# Patient Record
Sex: Male | Born: 1955 | Race: White | Hispanic: No | Marital: Married | Smoking: Former smoker
Health system: Southern US, Community
[De-identification: ages and names within clinical notes are randomized; demographics above are authoritative.]

## PROBLEM LIST (undated history)

## (undated) DIAGNOSIS — B002 Herpesviral gingivostomatitis and pharyngotonsillitis: Secondary | ICD-10-CM

## (undated) DIAGNOSIS — K219 Gastro-esophageal reflux disease without esophagitis: Secondary | ICD-10-CM

## (undated) DIAGNOSIS — J189 Pneumonia, unspecified organism: Secondary | ICD-10-CM

## (undated) DIAGNOSIS — Z86010 Personal history of colonic polyps: Secondary | ICD-10-CM

## (undated) DIAGNOSIS — R21 Rash and other nonspecific skin eruption: Secondary | ICD-10-CM

## (undated) DIAGNOSIS — E782 Mixed hyperlipidemia: Secondary | ICD-10-CM

## (undated) DIAGNOSIS — I10 Essential (primary) hypertension: Secondary | ICD-10-CM

## (undated) DIAGNOSIS — E039 Hypothyroidism, unspecified: Secondary | ICD-10-CM

## (undated) DIAGNOSIS — M1990 Unspecified osteoarthritis, unspecified site: Secondary | ICD-10-CM

## (undated) DIAGNOSIS — K5730 Diverticulosis of large intestine without perforation or abscess without bleeding: Secondary | ICD-10-CM

## (undated) DIAGNOSIS — K449 Diaphragmatic hernia without obstruction or gangrene: Secondary | ICD-10-CM

## (undated) DIAGNOSIS — Z860101 Personal history of adenomatous and serrated colon polyps: Secondary | ICD-10-CM

## (undated) DIAGNOSIS — Z8719 Personal history of other diseases of the digestive system: Secondary | ICD-10-CM

## (undated) DIAGNOSIS — Z87442 Personal history of urinary calculi: Secondary | ICD-10-CM

## (undated) DIAGNOSIS — N200 Calculus of kidney: Secondary | ICD-10-CM

## (undated) DIAGNOSIS — E785 Hyperlipidemia, unspecified: Secondary | ICD-10-CM

## (undated) DIAGNOSIS — T7840XA Allergy, unspecified, initial encounter: Secondary | ICD-10-CM

## (undated) HISTORY — DX: Herpesviral gingivostomatitis and pharyngotonsillitis: B00.2

## (undated) HISTORY — DX: Allergy, unspecified, initial encounter: T78.40XA

## (undated) HISTORY — PX: HERNIA REPAIR: SHX51

## (undated) HISTORY — DX: Hyperlipidemia, unspecified: E78.5

## (undated) HISTORY — DX: Unspecified osteoarthritis, unspecified site: M19.90

## (undated) HISTORY — PX: NASAL SEPTUM SURGERY: SHX37

## (undated) HISTORY — PX: ESOPHAGUS SURGERY: SHX626

## (undated) HISTORY — PX: KNEE ARTHROSCOPY: SUR90

## (undated) HISTORY — DX: Essential (primary) hypertension: I10

---
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
## 1987-10-20 HISTORY — PX: GASTROSTOMY: SHX151

## 1989-10-19 HISTORY — PX: INGUINAL HERNIA REPAIR: SUR1180

## 1990-10-19 HISTORY — PX: NASAL SEPTUM SURGERY: SHX37

## 1999-10-20 DIAGNOSIS — Z8719 Personal history of other diseases of the digestive system: Secondary | ICD-10-CM

## 1999-10-20 HISTORY — DX: Personal history of other diseases of the digestive system: Z87.19

## 2000-03-01 ENCOUNTER — Inpatient Hospital Stay (HOSPITAL_COMMUNITY): Admission: EM | Admit: 2000-03-01 | Discharge: 2000-03-03 | Payer: Self-pay | Admitting: *Deleted

## 2000-03-01 ENCOUNTER — Encounter: Payer: Self-pay | Admitting: Family Medicine

## 2000-03-17 ENCOUNTER — Encounter: Payer: Self-pay | Admitting: Internal Medicine

## 2000-07-28 ENCOUNTER — Inpatient Hospital Stay (HOSPITAL_COMMUNITY): Admission: EM | Admit: 2000-07-28 | Discharge: 2000-08-10 | Payer: Self-pay | Admitting: Emergency Medicine

## 2000-07-28 ENCOUNTER — Encounter: Payer: Self-pay | Admitting: Emergency Medicine

## 2000-07-29 ENCOUNTER — Encounter: Payer: Self-pay | Admitting: Internal Medicine

## 2000-07-31 ENCOUNTER — Encounter: Payer: Self-pay | Admitting: Infectious Diseases

## 2000-08-01 ENCOUNTER — Encounter: Payer: Self-pay | Admitting: Internal Medicine

## 2000-08-04 ENCOUNTER — Encounter: Payer: Self-pay | Admitting: Internal Medicine

## 2000-08-09 ENCOUNTER — Encounter: Payer: Self-pay | Admitting: Endocrinology

## 2000-08-09 ENCOUNTER — Encounter: Payer: Self-pay | Admitting: Internal Medicine

## 2000-08-16 ENCOUNTER — Encounter: Admission: RE | Admit: 2000-08-16 | Discharge: 2000-11-14 | Payer: Self-pay | Admitting: Family Medicine

## 2000-08-31 ENCOUNTER — Ambulatory Visit (HOSPITAL_COMMUNITY): Admission: RE | Admit: 2000-08-31 | Discharge: 2000-08-31 | Payer: Self-pay | Admitting: Family Medicine

## 2000-08-31 ENCOUNTER — Encounter: Payer: Self-pay | Admitting: Family Medicine

## 2000-09-27 ENCOUNTER — Ambulatory Visit (HOSPITAL_COMMUNITY): Admission: RE | Admit: 2000-09-27 | Discharge: 2000-09-27 | Payer: Self-pay | Admitting: Family Medicine

## 2000-09-27 ENCOUNTER — Encounter: Payer: Self-pay | Admitting: Family Medicine

## 2002-12-21 ENCOUNTER — Encounter: Payer: Self-pay | Admitting: Family Medicine

## 2002-12-21 ENCOUNTER — Encounter: Admission: RE | Admit: 2002-12-21 | Discharge: 2002-12-21 | Payer: Self-pay | Admitting: Family Medicine

## 2003-06-12 ENCOUNTER — Encounter: Admission: RE | Admit: 2003-06-12 | Discharge: 2003-06-12 | Payer: Self-pay | Admitting: Internal Medicine

## 2003-06-12 ENCOUNTER — Encounter: Payer: Self-pay | Admitting: Internal Medicine

## 2003-10-20 HISTORY — PX: SHOULDER ARTHROSCOPY: SHX128

## 2004-07-23 ENCOUNTER — Ambulatory Visit (HOSPITAL_COMMUNITY): Admission: RE | Admit: 2004-07-23 | Discharge: 2004-07-23 | Payer: Self-pay | Admitting: Orthopedic Surgery

## 2004-07-23 HISTORY — PX: SHOULDER ARTHROSCOPY W/ SUBACROMIAL DECOMPRESSION AND DISTAL CLAVICLE EXCISION: SHX2401

## 2004-09-24 ENCOUNTER — Ambulatory Visit: Payer: Self-pay | Admitting: Family Medicine

## 2004-10-03 ENCOUNTER — Ambulatory Visit: Payer: Self-pay | Admitting: Family Medicine

## 2004-12-31 ENCOUNTER — Ambulatory Visit: Payer: Self-pay | Admitting: Family Medicine

## 2005-01-06 ENCOUNTER — Ambulatory Visit: Payer: Self-pay | Admitting: Family Medicine

## 2005-01-13 ENCOUNTER — Ambulatory Visit: Payer: Self-pay | Admitting: Family Medicine

## 2005-03-24 ENCOUNTER — Inpatient Hospital Stay (HOSPITAL_COMMUNITY): Admission: EM | Admit: 2005-03-24 | Discharge: 2005-03-26 | Payer: Self-pay | Admitting: Emergency Medicine

## 2005-03-24 ENCOUNTER — Ambulatory Visit: Payer: Self-pay | Admitting: Endocrinology

## 2005-04-01 ENCOUNTER — Ambulatory Visit: Payer: Self-pay | Admitting: Family Medicine

## 2005-08-06 ENCOUNTER — Ambulatory Visit: Payer: Self-pay | Admitting: Family Medicine

## 2005-09-29 ENCOUNTER — Ambulatory Visit: Payer: Self-pay | Admitting: Family Medicine

## 2005-10-02 ENCOUNTER — Ambulatory Visit: Payer: Self-pay | Admitting: Family Medicine

## 2005-10-14 ENCOUNTER — Ambulatory Visit: Payer: Self-pay | Admitting: Family Medicine

## 2005-11-20 ENCOUNTER — Ambulatory Visit: Payer: Self-pay | Admitting: Family Medicine

## 2006-10-19 HISTORY — PX: TOTAL KNEE ARTHROPLASTY: SHX125

## 2006-12-08 ENCOUNTER — Ambulatory Visit: Payer: Self-pay | Admitting: Family Medicine

## 2006-12-08 LAB — CONVERTED CEMR LAB
Albumin: 3.8 g/dL (ref 3.5–5.2)
Basophils Absolute: 0 10*3/uL (ref 0.0–0.1)
Cholesterol: 158 mg/dL (ref 0–200)
Eosinophils Absolute: 0.1 10*3/uL (ref 0.0–0.6)
GFR calc non Af Amer: 84 mL/min
HCT: 40.3 % (ref 39.0–52.0)
HDL: 44.2 mg/dL (ref >39.0)
Hemoglobin: 13.6 g/dL (ref 13.0–17.0)
Hgb A1c MFr Bld: 5.7 % (ref 4.6–6.0)
MCHC: 33.7 g/dL (ref 30.0–36.0)
MCV: 83.9 fL (ref 78.0–100.0)
Monocytes Absolute: 0.5 10*3/uL (ref 0.2–0.7)
Monocytes Relative: 8.9 % (ref 3.0–11.0)
Neutrophils Relative %: 64.6 % (ref 43.0–77.0)
PSA: 1.38 ng/mL (ref 0.10–4.00)
Potassium: 4.8 meq/L (ref 3.5–5.1)
RBC: 4.81 M/uL (ref 4.22–5.81)
Sodium: 143 meq/L (ref 135–145)
TSH: 1.59 microintl units/mL (ref 0.35–5.50)
Total CHOL/HDL Ratio: 3.6

## 2006-12-16 ENCOUNTER — Ambulatory Visit: Payer: Self-pay | Admitting: Family Medicine

## 2007-01-06 ENCOUNTER — Ambulatory Visit: Payer: Self-pay | Admitting: Family Medicine

## 2007-01-24 ENCOUNTER — Ambulatory Visit: Payer: Self-pay | Admitting: Family Medicine

## 2007-01-27 ENCOUNTER — Ambulatory Visit: Payer: Self-pay | Admitting: Psychology

## 2007-01-31 ENCOUNTER — Ambulatory Visit: Payer: Self-pay | Admitting: Psychology

## 2007-02-02 ENCOUNTER — Ambulatory Visit: Payer: Self-pay | Admitting: Psychology

## 2007-02-10 ENCOUNTER — Ambulatory Visit: Payer: Self-pay | Admitting: Psychology

## 2007-02-10 ENCOUNTER — Ambulatory Visit: Payer: Self-pay | Admitting: Family Medicine

## 2007-02-14 ENCOUNTER — Ambulatory Visit: Payer: Self-pay | Admitting: Psychology

## 2007-02-22 ENCOUNTER — Ambulatory Visit: Payer: Self-pay | Admitting: Psychology

## 2007-02-28 ENCOUNTER — Ambulatory Visit: Payer: Self-pay | Admitting: Psychology

## 2007-03-11 ENCOUNTER — Ambulatory Visit: Payer: Self-pay | Admitting: Psychology

## 2007-03-22 ENCOUNTER — Ambulatory Visit: Payer: Self-pay | Admitting: Psychology

## 2007-03-28 ENCOUNTER — Ambulatory Visit: Payer: Self-pay | Admitting: Psychology

## 2007-04-05 ENCOUNTER — Ambulatory Visit: Payer: Self-pay | Admitting: Psychology

## 2007-04-12 ENCOUNTER — Ambulatory Visit: Payer: Self-pay | Admitting: Psychology

## 2007-04-19 ENCOUNTER — Ambulatory Visit: Payer: Self-pay | Admitting: Psychology

## 2007-05-06 ENCOUNTER — Ambulatory Visit: Payer: Self-pay | Admitting: Psychology

## 2007-05-17 ENCOUNTER — Ambulatory Visit: Payer: Self-pay | Admitting: Psychology

## 2007-06-03 DIAGNOSIS — J309 Allergic rhinitis, unspecified: Secondary | ICD-10-CM | POA: Insufficient documentation

## 2007-06-03 DIAGNOSIS — E039 Hypothyroidism, unspecified: Secondary | ICD-10-CM | POA: Insufficient documentation

## 2007-06-03 DIAGNOSIS — M1990 Unspecified osteoarthritis, unspecified site: Secondary | ICD-10-CM | POA: Insufficient documentation

## 2007-06-08 ENCOUNTER — Ambulatory Visit: Payer: Self-pay | Admitting: Psychology

## 2007-06-14 ENCOUNTER — Ambulatory Visit: Payer: Self-pay | Admitting: Psychology

## 2007-07-01 ENCOUNTER — Ambulatory Visit: Payer: Self-pay | Admitting: Psychology

## 2007-07-08 ENCOUNTER — Telehealth: Payer: Self-pay | Admitting: Family Medicine

## 2007-07-11 ENCOUNTER — Ambulatory Visit: Payer: Self-pay | Admitting: Family Medicine

## 2007-07-11 DIAGNOSIS — J1100 Influenza due to unidentified influenza virus with unspecified type of pneumonia: Secondary | ICD-10-CM | POA: Insufficient documentation

## 2007-07-14 ENCOUNTER — Ambulatory Visit: Payer: Self-pay | Admitting: Psychology

## 2007-07-22 ENCOUNTER — Telehealth: Payer: Self-pay | Admitting: Family Medicine

## 2007-08-02 ENCOUNTER — Ambulatory Visit: Payer: Self-pay | Admitting: Family Medicine

## 2007-08-02 DIAGNOSIS — R5381 Other malaise: Secondary | ICD-10-CM | POA: Insufficient documentation

## 2007-08-02 DIAGNOSIS — R5383 Other fatigue: Secondary | ICD-10-CM

## 2007-08-02 LAB — CONVERTED CEMR LAB
AST: 31 units/L (ref 0–37)
Albumin: 4.1 g/dL (ref 3.5–5.2)
Basophils Absolute: 0.1 10*3/uL (ref 0.0–0.1)
Basophils Relative: 0.8 % (ref 0.0–1.0)
Chloride: 105 meq/L (ref 96–112)
Creatinine, Ser: 0.9 mg/dL (ref 0.4–1.5)
Eosinophils Relative: 2.1 % (ref 0.0–5.0)
HCT: 38.1 % — ABNORMAL LOW (ref 39.0–52.0)
Neutrophils Relative %: 63.3 % (ref 43.0–77.0)
RBC: 4.57 M/uL (ref 4.22–5.81)
RDW: 12.8 % (ref 11.5–14.6)
Sodium: 141 meq/L (ref 135–145)
Total Bilirubin: 1.2 mg/dL (ref 0.3–1.2)
Vitamin B-12: 318 pg/mL (ref 211–911)
WBC: 7.2 10*3/uL (ref 4.5–10.5)

## 2007-08-10 ENCOUNTER — Telehealth: Payer: Self-pay | Admitting: Family Medicine

## 2007-08-11 ENCOUNTER — Ambulatory Visit: Payer: Self-pay | Admitting: Psychology

## 2007-08-12 ENCOUNTER — Telehealth: Payer: Self-pay | Admitting: Family Medicine

## 2007-08-19 ENCOUNTER — Ambulatory Visit: Payer: Self-pay | Admitting: Family Medicine

## 2007-08-26 HISTORY — PX: TOTAL KNEE ARTHROPLASTY: SHX125

## 2007-09-05 ENCOUNTER — Inpatient Hospital Stay (HOSPITAL_COMMUNITY): Admission: RE | Admit: 2007-09-05 | Discharge: 2007-09-08 | Payer: Self-pay | Admitting: Orthopedic Surgery

## 2007-09-21 ENCOUNTER — Telehealth: Payer: Self-pay | Admitting: Family Medicine

## 2007-09-23 ENCOUNTER — Telehealth: Payer: Self-pay | Admitting: Family Medicine

## 2007-10-06 ENCOUNTER — Ambulatory Visit: Payer: Self-pay | Admitting: Family Medicine

## 2007-10-06 DIAGNOSIS — E785 Hyperlipidemia, unspecified: Secondary | ICD-10-CM | POA: Insufficient documentation

## 2007-12-08 ENCOUNTER — Ambulatory Visit: Payer: Self-pay | Admitting: Family Medicine

## 2007-12-09 LAB — CONVERTED CEMR LAB
ALT: 21 units/L (ref 0–53)
Alkaline Phosphatase: 77 units/L (ref 39–117)
BUN: 14 mg/dL (ref 6–23)
Basophils Relative: 0.1 % (ref 0.0–1.0)
CO2: 27 meq/L (ref 19–32)
Calcium: 10.1 mg/dL (ref 8.4–10.5)
Chloride: 103 meq/L (ref 96–112)
Creatinine, Ser: 0.9 mg/dL (ref 0.4–1.5)
HDL: 35.6 mg/dL — ABNORMAL LOW (ref >39.0)
Hemoglobin: 13.2 g/dL (ref 13.0–17.0)
LDL Cholesterol: 122 mg/dL — ABNORMAL HIGH (ref 0–99)
Monocytes Relative: 12 % — ABNORMAL HIGH (ref 3.0–11.0)
Platelets: 259 10*3/uL (ref 150–400)
RBC: 5 M/uL (ref 4.22–5.81)
RDW: 13.8 % (ref 11.5–14.6)
TSH: 2.52 microintl units/mL (ref 0.35–5.50)
Total Bilirubin: 0.8 mg/dL (ref 0.3–1.2)
Total Protein: 6.8 g/dL (ref 6.0–8.3)
Triglycerides: 164 mg/dL — ABNORMAL HIGH (ref 0–149)
VLDL: 33 mg/dL (ref 0–40)

## 2007-12-20 ENCOUNTER — Ambulatory Visit: Payer: Self-pay | Admitting: Family Medicine

## 2007-12-20 DIAGNOSIS — F329 Major depressive disorder, single episode, unspecified: Secondary | ICD-10-CM | POA: Insufficient documentation

## 2007-12-20 DIAGNOSIS — F3289 Other specified depressive episodes: Secondary | ICD-10-CM | POA: Insufficient documentation

## 2007-12-21 ENCOUNTER — Telehealth: Payer: Self-pay | Admitting: Family Medicine

## 2007-12-23 ENCOUNTER — Telehealth: Payer: Self-pay | Admitting: Family Medicine

## 2008-01-12 ENCOUNTER — Ambulatory Visit: Payer: Self-pay | Admitting: Family Medicine

## 2008-01-20 ENCOUNTER — Telehealth: Payer: Self-pay | Admitting: Family Medicine

## 2008-03-21 ENCOUNTER — Ambulatory Visit: Payer: Self-pay | Admitting: Family Medicine

## 2008-03-21 LAB — CONVERTED CEMR LAB
Cholesterol: 186 mg/dL (ref 0–200)
LDL Cholesterol: 124 mg/dL — ABNORMAL HIGH (ref 0–99)
Triglycerides: 152 mg/dL — ABNORMAL HIGH (ref 0–149)
VLDL: 30 mg/dL (ref 0–40)

## 2008-10-25 ENCOUNTER — Telehealth: Payer: Self-pay | Admitting: Family Medicine

## 2008-11-27 ENCOUNTER — Ambulatory Visit: Payer: Self-pay | Admitting: Family Medicine

## 2008-11-27 DIAGNOSIS — I10 Essential (primary) hypertension: Secondary | ICD-10-CM | POA: Insufficient documentation

## 2008-12-27 ENCOUNTER — Telehealth: Payer: Self-pay | Admitting: Family Medicine

## 2009-01-04 ENCOUNTER — Ambulatory Visit: Payer: Self-pay | Admitting: Family Medicine

## 2009-01-04 LAB — CONVERTED CEMR LAB
ALT: 45 units/L (ref 0–53)
AST: 29 units/L (ref 0–37)
Albumin: 3.9 g/dL (ref 3.5–5.2)
Alkaline Phosphatase: 55 units/L (ref 39–117)
Basophils Relative: 0.5 % (ref 0.0–3.0)
Bilirubin, Direct: 0 mg/dL (ref 0.0–0.3)
CO2: 31 meq/L (ref 19–32)
Calcium: 9.1 mg/dL (ref 8.4–10.5)
Creatinine, Ser: 1 mg/dL (ref 0.4–1.5)
Direct LDL: 150.5 mg/dL
Eosinophils Relative: 2.3 % (ref 0.0–5.0)
GFR calc non Af Amer: 83.07 mL/min (ref >60)
Hemoglobin: 12.9 g/dL — ABNORMAL LOW (ref 13.0–17.0)
Ketones, urine, test strip: NEGATIVE
Lymphocytes Relative: 33.4 % (ref 12.0–46.0)
MCHC: 34 g/dL (ref 30.0–36.0)
Monocytes Relative: 11.8 % (ref 3.0–12.0)
Neutro Abs: 2.3 10*3/uL (ref 1.4–7.7)
Neutrophils Relative %: 52 % (ref 43.0–77.0)
Nitrite: NEGATIVE
RBC: 4.45 M/uL (ref 4.22–5.81)
Sodium: 141 meq/L (ref 135–145)
Total CHOL/HDL Ratio: 5
Total Protein: 6.6 g/dL (ref 6.0–8.3)
Triglycerides: 90 mg/dL (ref 0.0–149.0)
Urobilinogen, UA: 0.2
VLDL: 18 mg/dL (ref 0.0–40.0)
WBC: 4.3 10*3/uL — ABNORMAL LOW (ref 4.5–10.5)

## 2009-01-11 ENCOUNTER — Ambulatory Visit: Payer: Self-pay | Admitting: Family Medicine

## 2009-01-11 DIAGNOSIS — L578 Other skin changes due to chronic exposure to nonionizing radiation: Secondary | ICD-10-CM | POA: Insufficient documentation

## 2009-05-03 ENCOUNTER — Ambulatory Visit: Payer: Self-pay | Admitting: Family Medicine

## 2009-05-03 DIAGNOSIS — R42 Dizziness and giddiness: Secondary | ICD-10-CM | POA: Insufficient documentation

## 2009-05-07 LAB — CONVERTED CEMR LAB
Basophils Absolute: 0 10*3/uL (ref 0.0–0.1)
CO2: 31 meq/L (ref 19–32)
Calcium: 9.3 mg/dL (ref 8.4–10.5)
Creatinine, Ser: 0.9 mg/dL (ref 0.4–1.5)
GFR calc non Af Amer: 93.69 mL/min (ref >60)
Glucose, Bld: 113 mg/dL — ABNORMAL HIGH (ref 70–99)
HCT: 39.9 % (ref 39.0–52.0)
Hemoglobin: 13.5 g/dL (ref 13.0–17.0)
Lymphs Abs: 1.6 10*3/uL (ref 0.7–4.0)
MCV: 83.5 fL (ref 78.0–100.0)
Monocytes Absolute: 0.7 10*3/uL (ref 0.1–1.0)
Monocytes Relative: 10.1 % (ref 3.0–12.0)
Neutro Abs: 4.1 10*3/uL (ref 1.4–7.7)
Platelets: 240 10*3/uL (ref 150.0–400.0)
RDW: 12.7 % (ref 11.5–14.6)
Sodium: 141 meq/L (ref 135–145)
Testosterone: 317.15 ng/dL — ABNORMAL LOW (ref 350.00–890.00)

## 2009-05-09 ENCOUNTER — Ambulatory Visit: Payer: Self-pay | Admitting: Family Medicine

## 2009-05-10 LAB — CONVERTED CEMR LAB
Glucose, 2 hour: 89 mg/dL
Glucose, GTT - 3 Hour: 102 mg/dL

## 2009-08-01 ENCOUNTER — Telehealth: Payer: Self-pay | Admitting: Family Medicine

## 2010-01-06 ENCOUNTER — Ambulatory Visit: Payer: Self-pay | Admitting: Family Medicine

## 2010-01-06 LAB — CONVERTED CEMR LAB
Bilirubin Urine: NEGATIVE
Blood in Urine, dipstick: NEGATIVE
Ketones, urine, test strip: NEGATIVE
Specific Gravity, Urine: 1.02
pH: 7

## 2010-01-08 DIAGNOSIS — R972 Elevated prostate specific antigen [PSA]: Secondary | ICD-10-CM | POA: Insufficient documentation

## 2010-01-08 LAB — CONVERTED CEMR LAB
Albumin: 4.1 g/dL (ref 3.5–5.2)
Alkaline Phosphatase: 59 units/L (ref 39–117)
BUN: 17 mg/dL (ref 6–23)
Basophils Absolute: 0 10*3/uL (ref 0.0–0.1)
CO2: 31 meq/L (ref 19–32)
Calcium: 9.3 mg/dL (ref 8.4–10.5)
Cholesterol: 226 mg/dL — ABNORMAL HIGH (ref 0–200)
Creatinine, Ser: 1 mg/dL (ref 0.4–1.5)
Direct LDL: 170.1 mg/dL
Eosinophils Absolute: 0.1 10*3/uL (ref 0.0–0.7)
GFR calc non Af Amer: 82.75 mL/min (ref >60)
Glucose, Bld: 92 mg/dL (ref 70–99)
HDL: 45.2 mg/dL (ref >39.00)
Hemoglobin: 14.3 g/dL (ref 13.0–17.0)
Lymphocytes Relative: 29.9 % (ref 12.0–46.0)
MCHC: 32.9 g/dL (ref 30.0–36.0)
Neutro Abs: 3.1 10*3/uL (ref 1.4–7.7)
Neutrophils Relative %: 57.5 % (ref 43.0–77.0)
Platelets: 236 10*3/uL (ref 150.0–400.0)
RDW: 12.6 % (ref 11.5–14.6)
Testosterone: 262.09 ng/dL — ABNORMAL LOW (ref 350.00–890.00)
Total Bilirubin: 0.7 mg/dL (ref 0.3–1.2)
Triglycerides: 113 mg/dL (ref 0.0–149.0)

## 2010-01-13 ENCOUNTER — Ambulatory Visit: Payer: Self-pay | Admitting: Family Medicine

## 2010-01-31 ENCOUNTER — Encounter: Payer: Self-pay | Admitting: Family Medicine

## 2010-03-03 ENCOUNTER — Ambulatory Visit: Payer: Self-pay | Admitting: Family Medicine

## 2010-03-03 DIAGNOSIS — J209 Acute bronchitis, unspecified: Secondary | ICD-10-CM | POA: Insufficient documentation

## 2010-03-06 ENCOUNTER — Encounter (INDEPENDENT_AMBULATORY_CARE_PROVIDER_SITE_OTHER): Payer: Self-pay | Admitting: *Deleted

## 2010-11-11 ENCOUNTER — Encounter (INDEPENDENT_AMBULATORY_CARE_PROVIDER_SITE_OTHER): Payer: Self-pay | Admitting: *Deleted

## 2010-11-20 NOTE — Consult Note (Signed)
Summary: [facility]  [facility]   Imported By: Maryln Gottron 02/06/2010 13:44:57  _____________________________________________________________________  External Attachment:    Type:   Image     Comment:   External Document

## 2010-11-20 NOTE — Assessment & Plan Note (Signed)
Summary: cough/stuffy nose/congestion/cjr   Vital Signs:  Patient profile:   55 year old male Weight:      226 pounds BMI:     33.50 Temp:     97.9 degrees F oral BP sitting:   148 / 76  (left arm) Cuff size:   regular  Vitals Entered By: Raechel Ache, RN (Mar 03, 2010 11:48 AM) CC: C/o head congestion, headache, cough, sweats and cold.   History of Present Illness: Here for 5 days of stuffy head, PND, chest congestion, coughing up green sputum, and sweats.   Allergies: 1)  ! Zocor 2)  Sulfamethoxazole (Sulfamethoxazole)  Past History:  Past Medical History: Reviewed history from 01/11/2009 and no changes required. High Cholesterol Allergic rhinitis Hypothyroidism Osteoarthritis fever blisters Depression, per Dr. Nolen Mu sleep study showed poor REM sleep but no sleep apnea Hypertension  Review of Systems  The patient denies anorexia, weight loss, weight gain, vision loss, decreased hearing, hoarseness, chest pain, syncope, dyspnea on exertion, peripheral edema, headaches, hemoptysis, abdominal pain, melena, hematochezia, severe indigestion/heartburn, hematuria, incontinence, genital sores, muscle weakness, suspicious skin lesions, transient blindness, difficulty walking, depression, unusual weight change, abnormal bleeding, enlarged lymph nodes, angioedema, breast masses, and testicular masses.    Physical Exam  General:  Well-developed,well-nourished,in no acute distress; alert,appropriate and cooperative throughout examination Head:  Normocephalic and atraumatic without obvious abnormalities. No apparent alopecia or balding. Eyes:  No corneal or conjunctival inflammation noted. EOMI. Perrla. Funduscopic exam benign, without hemorrhages, exudates or papilledema. Vision grossly normal. Ears:  External ear exam shows no significant lesions or deformities.  Otoscopic examination reveals clear canals, tympanic membranes are intact bilaterally without bulging, retraction,  inflammation or discharge. Hearing is grossly normal bilaterally. Nose:  External nasal examination shows no deformity or inflammation. Nasal mucosa are pink and moist without lesions or exudates. Mouth:  Oral mucosa and oropharynx without lesions or exudates.  Teeth in good repair. Neck:  No deformities, masses, or tenderness noted. Lungs:  scattered rhonchi   Impression & Recommendations:  Problem # 1:  ACUTE BRONCHITIS (ICD-466.0)  His updated medication list for this problem includes:    Zithromax Z-pak 250 Mg Tabs (Azithromycin) .Marland Kitchen... As directed  Complete Medication List: 1)  Synthroid 150 Mcg Tabs (Levothyroxine sodium) .... Once daily 2)  Viagra 25 Mg Tabs (Sildenafil citrate) .... Take 1 tablet by mouth 3)  Aspirin Ec 81 Mg Tbec (Aspirin) .... M w f 4)  Aleve 220 Mg Tabs (Naproxen sodium) .... Two times a day 5)  Zovirax 200 Mg Caps (Acyclovir) .... 5 times a day as needed 6)  Vitamin C 500 Mg Tabs (Ascorbic acid) .... Once daily 7)  Fish Oil Oil (Fish oil) .... Once daily 8)  Calcium Carbonate-vitamin D 600-400 Mg-unit Tabs (Calcium carbonate-vitamin d) .... Once daily 9)  Flax Oil (Flaxseed (linseed)) .... Once daily 10)  Vitamin B-12 1000 Mcg Tabs (Cyanocobalamin) .... Once daily 11)  Efudex 5 % Crea (Fluorouracil) .... Apply once daily 12)  Naftin 1 % Crea (Naftifine hcl) .... Once daily 13)  Elocon 0.1 % Crea (Mometasone furoate) .... Once daily 14)  Zithromax Z-pak 250 Mg Tabs (Azithromycin) .... As directed  Patient Instructions: 1)  Please schedule a follow-up appointment as needed .  Prescriptions: ZITHROMAX Z-PAK 250 MG TABS (AZITHROMYCIN) as directed  #1 x 0   Entered and Authorized by:   Nelwyn [place] MD   Signed by:   Nelwyn [place] MD on 03/03/2010   Method used:  Electronically to        [facility]  [phone]* (retail)       [address]       [place], [place]  [address]       Ph: [phone] or [phone]        Fax: [phone]   RxID:   [id number]

## 2010-11-20 NOTE — Assessment & Plan Note (Signed)
Summary: CPX/RCD   Vital Signs:  Patient profile:   55 year old male Height:      69 inches Weight:      230 pounds BMI:     34.09 BP sitting:   126 / 60  (left arm) Cuff size:   regular  Vitals Entered By: Raechel Ache, RN (January 13, 2010 10:29 AM) CC: CPX, labs done.    History of Present Illness: 55 yr old male for cpx. He feels fine and has no concerns. However we spent some time today discussing abnormal labs. His LDL has jumped up to 170, and he admits to eating very poorly for the past 6 months. Also his PSA has increased to 4.5 from 1.55 last year. He has been using Androgel, so we advised him to stop using this, and he has stopped it. We have referred hm to Urology to evaluate this.  Allergies: 1)  ! Zocor 2)  Sulfamethoxazole (Sulfamethoxazole)  Past History:  Past Medical History: Reviewed history from 01/11/2009 and no changes required. High Cholesterol Allergic rhinitis Hypothyroidism Osteoarthritis fever blisters Depression, per Dr. Nolen Mu sleep study showed poor REM sleep but no sleep apnea Hypertension  Past Surgical History: Reviewed history from 12/20/2007 and no changes required. Colonoscopy per Dr. Marina Goodell in 2001, repeat in 10 yrs Total knee replacement, left, 2008 per Dr. Lequita Halt Inguinal herniorrhaphy, left deviated septum repair right shoulder arthroscopy, per Dr. Lequita Halt 2005 esophageal repair as infant gastrostomy repair per Dr. Maple Hudson 1989  Family History: Reviewed history from 12/20/2007 and no changes required. Family History of CAD Male 1st degree relative <50 Family History Thyroid disease Parkinsons disease  Social History: Reviewed history from 12/20/2007 and no changes required. Occupation: Married Former Smoker Alcohol use-no  Review of Systems  The patient denies anorexia, fever, weight loss, weight gain, vision loss, decreased hearing, hoarseness, chest pain, syncope, dyspnea on exertion, peripheral edema, prolonged  cough, headaches, hemoptysis, abdominal pain, melena, hematochezia, severe indigestion/heartburn, hematuria, incontinence, genital sores, muscle weakness, suspicious skin lesions, transient blindness, difficulty walking, depression, unusual weight change, abnormal bleeding, enlarged lymph nodes, angioedema, breast masses, and testicular masses.    Physical Exam  General:  overweight-appearing.   Head:  Normocephalic and atraumatic without obvious abnormalities. No apparent alopecia or balding. Eyes:  No corneal or conjunctival inflammation noted. EOMI. Perrla. Funduscopic exam benign, without hemorrhages, exudates or papilledema. Vision grossly normal. Ears:  External ear exam shows no significant lesions or deformities.  Otoscopic examination reveals clear canals, tympanic membranes are intact bilaterally without bulging, retraction, inflammation or discharge. Hearing is grossly normal bilaterally. Nose:  External nasal examination shows no deformity or inflammation. Nasal mucosa are pink and moist without lesions or exudates. Mouth:  Oral mucosa and oropharynx without lesions or exudates.  Teeth in good repair. Neck:  No deformities, masses, or tenderness noted. Chest Wall:  No deformities, masses, tenderness or gynecomastia noted. Lungs:  Normal respiratory effort, chest expands symmetrically. Lungs are clear to auscultation, no crackles or wheezes. Heart:  Normal rate and regular rhythm. S1 and S2 normal without gallop, murmur, click, rub or other extra sounds. EKG is at his baseline with nonspecific inferior T wave changes Abdomen:  Bowel sounds positive,abdomen soft and non-tender without masses, organomegaly or hernias noted. Rectal:  No external abnormalities noted. Normal sphincter tone. No rectal masses or tenderness. Heme neg. Genitalia:  Testes bilaterally descended without nodularity, tenderness or masses. No scrotal masses or lesions. No penis lesions or urethral discharge. Prostate:   no nodules,  no asymmetry, no induration, and 1+ enlarged.   Msk:  No deformity or scoliosis noted of thoracic or lumbar spine.   Pulses:  R and L carotid,radial,femoral,dorsalis pedis and posterior tibial pulses are full and equal bilaterally Extremities:  No clubbing, cyanosis, edema, or deformity noted with normal full range of motion of all joints.   Neurologic:  No cranial nerve deficits noted. Station and gait are normal. Plantar reflexes are down-going bilaterally. DTRs are symmetrical throughout. Sensory, motor and coordinative functions appear intact. Skin:  Intact without suspicious lesions or rashes Cervical Nodes:  No lymphadenopathy noted Axillary Nodes:  No palpable lymphadenopathy Inguinal Nodes:  No significant adenopathy Psych:  Cognition and judgment appear intact. Alert and cooperative with normal attention span and concentration. No apparent delusions, illusions, hallucinations   Impression & Recommendations:  Problem # 1:  WELL ADULT EXAM (ICD-V70.0)  Orders: Hemoccult Guaiac-1 spec.(in office) (82270) EKG w/ Interpretation (93000)  Complete Medication List: 1)  Synthroid 150 Mcg Tabs (Levothyroxine sodium) .... Once daily 2)  Viagra 25 Mg Tabs (Sildenafil citrate) .... Take 1 tablet by mouth 3)  Aspirin Ec 81 Mg Tbec (Aspirin) .... M w f 4)  Aleve 220 Mg Tabs (Naproxen sodium) .... Two times a day 5)  Zovirax 200 Mg Caps (Acyclovir) .... 5 times a day as needed 6)  Vitamin C 500 Mg Tabs (Ascorbic acid) .... Once daily 7)  Fish Oil Oil (Fish oil) .... Once daily 8)  Calcium Carbonate-vitamin D 600-400 Mg-unit Tabs (Calcium carbonate-vitamin d) .... Once daily 9)  Flax Oil (Flaxseed (linseed)) .... Once daily 10)  Vitamin B-12 1000 Mcg Tabs (Cyanocobalamin) .... Once daily 11)  Efudex 5 % Crea (Fluorouracil) .... Apply once daily  Other Orders: TD Toxoids IM 7 YR + ([address]) Admin 1st Vaccine ([address]454)  Patient Instructions: 1)  It is important that you exercise  reguarly at least 20 minutes 5 times a week. If you develop chest pain, have severe difficulty breathing, or feel very tired, stop exercising immediately and seek medical attention.  2)  You need to lose weight. Consider a lower calorie diet and regular exercise.  3)  we agreed to follow up strict diet, and we will recheck a lipid panel in 6 months.  4)  See Urology about the elevated PSA. Stay off hormonal supplements.    Immunizations Administered:  Tetanus Vaccine:    Vaccine Type: Td    Site: left deltoid    Mfr: Sanofi Pasteur    Dose: 0.5 ml    Route: IM    Given by: Raechel Ache, RN    Exp. Date: 10/03/2011    Lot #: [id number]    VIS given: 09/06/07 version given January 13, 2010.

## 2010-11-20 NOTE — Letter (Signed)
Summary: Colonoscopy Letter  [facility]  [address] [place], [place] [address]   Phone: [phone]  Fax: [phone]      Mar 06, 2010 MRN: [id number]   Luke Skinner [address] [place], [place]  [address]   Dear Luke Skinner,   According to your medical record, it is time for you to schedule a Colonoscopy. The American Cancer Society recommends this procedure as a method to detect early colon cancer. Patients with a family history of colon cancer, or a personal history of colon polyps or inflammatory bowel disease are at increased risk.  This letter has beeen generated based on the recommendations made at the time of your procedure. If you feel that in your particular situation this may no longer apply, please contact our office.  Please call our office at [phone] to schedule this appointment or to update your records at your earliest convenience.  Thank you for cooperating with [place] to provide you with the very best care possible.   Sincerely,  Wilhemina Bonito. Marina Goodell, M.D.  [facility] Gastroenterology Division [phone]

## 2010-11-20 NOTE — Letter (Signed)
Summary: Pre Visit Letter Revised  [facility]  [address] [place], [place] [address]   Phone: [phone]  Fax: [phone]        11/11/2010 MRN: [id number] RUVIM RISKO [address] [place], [place]  [address]             Procedure Date:  12-09-10   Welcome to the Gastroenterology Division at [facility].    You are scheduled to see a nurse for your pre-procedure visit on 11-25-10 at 2:00P.M. on the 3rd floor at [facility], [address]  We ask that you try to arrive at our office 15 minutes prior to your appointment time to allow for check-in.  Please take a minute to review the attached form.  If you answer "Yes" to one or more of the questions on the first page, we ask that you call the person listed at your earliest opportunity.  If you answer "No" to all of the questions, please complete the rest of the form and bring it to your appointment.    Your nurse visit will consist of discussing your medical and surgical history, your immediate family medical history, and your medications.   If you are unable to list all of your medications on the form, please bring the medication bottles to your appointment and we will list them.  We will need to be aware of both prescribed and over the counter drugs.  We will need to know exact dosage information as well.    Please be prepared to read and sign documents such as consent forms, a financial agreement, and acknowledgement forms.  If necessary, and with your consent, a friend or relative is welcome to sit-in on the nurse visit with you.  Please bring your insurance card so that we may make a copy of it.  If your insurance requires a referral to see a specialist, please bring your referral form from your primary care physician.  No co-pay is required for this nurse visit.     If you cannot keep your appointment, please call [phone] to cancel or reschedule prior to your appointment date.  This allows [place] the  opportunity to schedule an appointment for another patient in need of care.    Thank you for choosing [facility] for your medical needs.  We appreciate the opportunity to care for you.  Please visit [place] at our website  to learn more about our practice.  Sincerely, The Gastroenterology Division

## 2010-11-24 ENCOUNTER — Encounter (INDEPENDENT_AMBULATORY_CARE_PROVIDER_SITE_OTHER): Payer: Self-pay | Admitting: *Deleted

## 2010-11-25 ENCOUNTER — Encounter: Payer: Self-pay | Admitting: Internal Medicine

## 2010-12-04 NOTE — Miscellaneous (Signed)
Summary: [facility] Previsit/prep  Clinical Lists Changes  Medications: Added new medication of MOVIPREP 100 GM  SOLR (PEG-KCL-NACL-NASULF-NA ASC-C) As per prep instructions. - Signed Rx of MOVIPREP 100 GM  SOLR (PEG-KCL-NACL-NASULF-NA ASC-C) As per prep instructions.;  #1 x 0;  Signed;  Entered by: Wyona Almas RN;  Authorized by: Hilarie Fredrickson MD;  Method used: Electronically to [facility]  [phone]*, [address] [place], [place], [place]  [address], Ph: [phone] or [phone], Fax: [phone] Observations: Added new observation of ALLERGY REV: Done (11/25/2010 14:07)    Prescriptions: MOVIPREP 100 GM  SOLR (PEG-KCL-NACL-NASULF-NA ASC-C) As per prep instructions.  #1 x 0   Entered by:   Wyona Almas RN   Authorized by:   Hilarie Fredrickson MD   Signed by:   Wyona Almas RN on 11/25/2010   Method used:   Electronically to        [facility]  [phone]* (retail)       [address]       [place], [place]  [address]       Ph: [phone] or [phone]       Fax: [phone]   RxID:   [id number]

## 2010-12-04 NOTE — Letter (Signed)
Summary: [facility] Instructions  [facility]  [address] [place], [place] [address]   Phone: [phone]  Fax: [phone]       NYKO GELL    1956/09/23    MRN: [id number]        Procedure Day Dorna Bloom:  Jake Shark  12/09/10     Arrival Time:  12:30PM     Procedure Time:  1:30PM     Location of Procedure:                    _X _  [facility] (4th Floor)                      PREPARATION FOR COLONOSCOPY WITH MOVIPREP   Starting 5 days prior to your procedure 12/04/10 do not eat nuts, seeds, popcorn, corn, beans, peas,  salads, or any raw vegetables.  Do not take any fiber supplements (e.g. Metamucil, Citrucel, and Benefiber).  THE DAY BEFORE YOUR PROCEDURE         DATE: 12/08/10  DAY: MONDAY  1.  Drink clear liquids the entire day-NO SOLID FOOD  2.  Do not drink anything colored red or purple.  Avoid juices with pulp.  No orange juice.  3.  Drink at least 64 oz. (8 glasses) of fluid/clear liquids during the day to prevent dehydration and help the prep work efficiently.  CLEAR LIQUIDS INCLUDE: Water Jello Ice Popsicles Tea (sugar ok, no milk/cream) Powdered fruit flavored drinks Coffee (sugar ok, no milk/cream) Gatorade Juice: apple, white grape, white cranberry  Lemonade Clear bullion, consomm, broth Carbonated beverages (any kind) Strained chicken noodle soup Hard Candy                             4.  In the morning, mix first dose of MoviPrep solution:    Empty 1 Pouch A and 1 Pouch B into the disposable container    Add lukewarm drinking water to the top line of the container. Mix to dissolve    Refrigerate (mixed solution should be used within 24 hrs)  5.  Begin drinking the prep at 5:00 p.m. The MoviPrep container is divided by 4 marks.   Every 15 minutes drink the solution down to the next mark (approximately 8 oz) until the full liter is complete.   6.  Follow completed prep with 16 oz of clear liquid of your choice (Nothing  red or purple).  Continue to drink clear liquids until bedtime.  7.  Before going to bed, mix second dose of MoviPrep solution:    Empty 1 Pouch A and 1 Pouch B into the disposable container    Add lukewarm drinking water to the top line of the container. Mix to dissolve    Refrigerate  THE DAY OF YOUR PROCEDURE      DATE: 12/09/10   DAY: TUESDAY  Beginning at 8:30AM (5 hours before procedure):         1. Every 15 minutes, drink the solution down to the next mark (approx 8 oz) until the full liter is complete.  2. Follow completed prep with 16 oz. of clear liquid of your choice.    3. You may drink clear liquids until 11:30AM (2 HOURS BEFORE PROCEDURE).   MEDICATION INSTRUCTIONS  Unless otherwise instructed, you should take regular prescription medications with a small sip of water   as early as possible the morning of  your procedure.        OTHER INSTRUCTIONS  You will need a responsible adult at least 55 years of age to accompany you and drive you home.   This person must remain in the waiting room during your procedure.  Wear loose fitting clothing that is easily removed.  Leave jewelry and other valuables at home.  However, you may wish to bring a book to read or  an iPod/MP3 player to listen to music as you wait for your procedure to start.  Remove all body piercing jewelry and leave at home.  Total time from sign-in until discharge is approximately 2-3 hours.  You should go home directly after your procedure and rest.  You can resume normal activities the  day after your procedure.  The day of your procedure you should not:   Drive   Make legal decisions   Operate machinery   Drink alcohol   Return to work  You will receive specific instructions about eating, activities and medications before you leave.    The above instructions have been reviewed and explained to me by   Wyona Almas RN  November 25, 2010 2:43 PM     I fully understand and  can verbalize these instructions _____________________________ Date _________

## 2010-12-09 ENCOUNTER — Other Ambulatory Visit (AMBULATORY_SURGERY_CENTER): Payer: 59 | Admitting: Internal Medicine

## 2010-12-09 ENCOUNTER — Encounter: Payer: Self-pay | Admitting: Internal Medicine

## 2010-12-09 DIAGNOSIS — K648 Other hemorrhoids: Secondary | ICD-10-CM

## 2010-12-09 DIAGNOSIS — Z1211 Encounter for screening for malignant neoplasm of colon: Secondary | ICD-10-CM

## 2010-12-09 DIAGNOSIS — K5730 Diverticulosis of large intestine without perforation or abscess without bleeding: Secondary | ICD-10-CM

## 2010-12-09 HISTORY — PX: COLONOSCOPY: SHX174

## 2010-12-16 NOTE — Procedures (Signed)
Summary: Colonoscopy  Patient: [name] Note: All result statuses are Final unless otherwise noted.  Tests: (1) Colonoscopy (COL)   COL Colonoscopy           DONE     [facility]     [address]     [place], [place]  [address]           COLONOSCOPY PROCEDURE REPORT           PATIENT:  Luke Skinner, Luke Skinner  MR#:  [id number]     BIRTHDATE:  Jan 02, 1956, 54 yrs. old  GENDER:  male     ENDOSCOPIST:  Wilhemina Bonito. Eda Keys, MD     REF. BY:  Screening Recall     PROCEDURE DATE:  12/09/2010     PROCEDURE:  Average-risk screening colonoscopy     G0121     ASA CLASS:  Class II     INDICATIONS:  Routine Risk Screening negative index exam 02-2000     MEDICATIONS:   Fentanyl 75 mcg IV, Versed 7 mg IV           DESCRIPTION OF PROCEDURE:   After the risks benefits and     alternatives of the procedure were thoroughly explained, informed     consent was obtained.  Digital rectal exam was performed and     revealed no abnormalities.   The LB 180AL [id number] endoscope was     introduced through the anus and advanced to the cecum, which was     identified by both the appendix and ileocecal valve, without     limitations.Time to cecum = 6:61min. The quality of the prep was     excellent, using MoviPrep.  The instrument was then slowly     withdrawn (time = 12:45 min) as the colon was fully examined.     <<PROCEDUREIMAGES>>           FINDINGS:  Mild diverticulosis was found in the sigmoid colon.     Otherwise normal colonoscopy without other polyps, masses, vascular     ectasias, or inflammatory changes.   Retroflexed views in the     rectum revealed internal hemorrhoids.    The scope was then     withdrawn from the patient and the procedure completed.           COMPLICATIONS:  None           ENDOSCOPIC IMPRESSION:     1) Mild diverticulosis in the sigmoid colon     2) Otherwise normal colonoscopy     3) Internal hemorrhoids           RECOMMENDATIONS:     1) Continue current  colorectal screening recommendations for     "routine risk" patients with a repeat colonoscopy in 10 years.     ______________________________     Wilhemina Bonito. Eda Keys, MD           CC:  Nelwyn [place], MD;  The Patient           n.     eSIGNED:   Wilhemina Bonito. Eda Keys at 12/09/2010 02:24 PM           [place], Viviann Spare, [phone]  Note: An exclamation mark (!) indicates a result that was not dispersed into the flowsheet. Document Creation Date: 12/09/2010 2:24 PM _______________________________________________________________________  (1) Order result status: Final Collection or observation date-time: 12/09/2010 14:16 Requested date-time:  Receipt date-time:  Reported date-time:  Referring Physician:  Ordering Physician: Fransico Setters [phone]) Specimen Source:  Source: Launa Grill Order Number: [id number] Lab site:   Appended Document: Colonoscopy    Clinical Lists Changes  Observations: Added new observation of COLONNXTDUE: 11/2020 (12/10/2010 8:16)

## 2010-12-16 NOTE — Procedures (Signed)
Summary: [facility]   Imported By: Sherian Rein 12/10/2010 07:14:16  _____________________________________________________________________  External Attachment:    Type:   Image     Comment:   External Document

## 2011-01-13 ENCOUNTER — Telehealth: Payer: Self-pay | Admitting: Family Medicine

## 2011-01-13 NOTE — Telephone Encounter (Signed)
Having cpx labs on 02-18-2011. He would like to add testosterone. Ok to do so?

## 2011-01-14 NOTE — Telephone Encounter (Signed)
Yes please add this for V70.0

## 2011-02-18 ENCOUNTER — Other Ambulatory Visit (INDEPENDENT_AMBULATORY_CARE_PROVIDER_SITE_OTHER): Payer: 59 | Admitting: Family Medicine

## 2011-02-18 DIAGNOSIS — Z0000 Encounter for general adult medical examination without abnormal findings: Secondary | ICD-10-CM

## 2011-02-18 DIAGNOSIS — Z13220 Encounter for screening for lipoid disorders: Secondary | ICD-10-CM

## 2011-02-18 LAB — POCT URINALYSIS DIPSTICK
Bilirubin, UA: NEGATIVE
Leukocytes, UA: NEGATIVE
Nitrite, UA: NEGATIVE
Protein, UA: NEGATIVE
pH, UA: 6

## 2011-02-18 LAB — CBC WITH DIFFERENTIAL/PLATELET
Basophils Absolute: 0 10*3/uL (ref 0.0–0.1)
Basophils Relative: 0.5 % (ref 0.0–3.0)
Eosinophils Absolute: 0.1 10*3/uL (ref 0.0–0.7)
HCT: 40.1 % (ref 39.0–52.0)
Hemoglobin: 13.6 g/dL (ref 13.0–17.0)
Lymphocytes Relative: 32.1 % (ref 12.0–46.0)
Lymphs Abs: 1.6 10*3/uL (ref 0.7–4.0)
MCHC: 33.8 g/dL (ref 30.0–36.0)
MCV: 84.9 fl (ref 78.0–100.0)
Monocytes Absolute: 0.5 10*3/uL (ref 0.1–1.0)
Neutro Abs: 2.8 10*3/uL (ref 1.4–7.7)
RBC: 4.73 Mil/uL (ref 4.22–5.81)
RDW: 13.4 % (ref 11.5–14.6)

## 2011-02-18 LAB — LIPID PANEL
Cholesterol: 226 mg/dL — ABNORMAL HIGH (ref 0–200)
HDL: 44.9 mg/dL (ref >39.00)
VLDL: 20.4 mg/dL (ref 0.0–40.0)

## 2011-02-18 LAB — HEPATIC FUNCTION PANEL
Albumin: 4.1 g/dL (ref 3.5–5.2)
Alkaline Phosphatase: 67 U/L (ref 39–117)
Total Protein: 6.7 g/dL (ref 6.0–8.3)

## 2011-02-18 LAB — BASIC METABOLIC PANEL
CO2: 28 mEq/L (ref 19–32)
Calcium: 9.7 mg/dL (ref 8.4–10.5)
Chloride: 104 mEq/L (ref 96–112)
Glucose, Bld: 97 mg/dL (ref 70–99)
Sodium: 141 mEq/L (ref 135–145)

## 2011-02-19 NOTE — Progress Notes (Signed)
Pt Informed. States will not take Rx called in/Dr Clent Ridges informed.

## 2011-03-02 ENCOUNTER — Ambulatory Visit (INDEPENDENT_AMBULATORY_CARE_PROVIDER_SITE_OTHER): Payer: 59 | Admitting: Family Medicine

## 2011-03-02 ENCOUNTER — Encounter: Payer: Self-pay | Admitting: Family Medicine

## 2011-03-02 VITALS — BP 124/68 | HR 71 | Temp 98.1°F | Resp 16 | Ht 69.0 in | Wt 228.7 lb

## 2011-03-02 DIAGNOSIS — Z136 Encounter for screening for cardiovascular disorders: Secondary | ICD-10-CM

## 2011-03-02 DIAGNOSIS — Z0000 Encounter for general adult medical examination without abnormal findings: Secondary | ICD-10-CM

## 2011-03-02 DIAGNOSIS — Z23 Encounter for immunization: Secondary | ICD-10-CM

## 2011-03-02 MED ORDER — NAFTIFINE HCL 1 % EX CREA: TOPICAL_CREAM | CUTANEOUS | Status: DC | PRN

## 2011-03-02 MED ORDER — PNEUMOCOCCAL VAC POLYVALENT 25 MCG/0.5ML IJ INJ: 0.5000 mL | INJECTION | Freq: Once | INTRAMUSCULAR | Status: DC

## 2011-03-02 MED ORDER — LEVOTHYROXINE SODIUM 150 MCG PO TABS: 150.0000 ug | ORAL_TABLET | Freq: Every day | ORAL | Status: DC

## 2011-03-02 MED ORDER — MOMETASONE FUROATE 0.1 % EX CREA: TOPICAL_CREAM | CUTANEOUS | Status: DC | PRN

## 2011-03-02 MED ORDER — ACYCLOVIR 200 MG PO CAPS: 200.0000 mg | ORAL_CAPSULE | ORAL | Status: DC | PRN

## 2011-03-02 NOTE — Progress Notes (Signed)
  Subjective:    Patient ID: Luke Skinner, male    DOB: 09/11/1956, 55 y.o.   MRN: [id number]  HPI 55 yr old male for a cpx. He feels well with no complaints.    Review of Systems  Constitutional: Negative.   HENT: Negative.   Eyes: Negative.   Respiratory: Negative.   Cardiovascular: Negative.   Gastrointestinal: Negative.   Genitourinary: Negative.   Musculoskeletal: Negative.   Skin: Negative.   Neurological: Negative.   Hematological: Negative.   Psychiatric/Behavioral: Negative.        Objective:   Physical Exam  Constitutional: He is oriented to person, place, and time. He appears well-developed and well-nourished. No distress.  HENT:  Head: Normocephalic and atraumatic.  Right Ear: External ear normal.  Left Ear: External ear normal.  Nose: Nose normal.  Mouth/Throat: Oropharynx is clear and moist. No oropharyngeal exudate.  Eyes: Conjunctivae and EOM are normal. Pupils are equal, round, and reactive to light. Right eye exhibits no discharge. Left eye exhibits no discharge. No scleral icterus.  Neck: Neck supple. No JVD present. No tracheal deviation present. No thyromegaly present.  Cardiovascular: Normal rate, regular rhythm, normal heart sounds and intact distal pulses.  Exam reveals no gallop and no friction rub.   No murmur heard.      EKG normal   Pulmonary/Chest: Effort normal and breath sounds normal. No respiratory distress. He has no wheezes. He has no rales. He exhibits no tenderness.  Abdominal: Soft. Bowel sounds are normal. He exhibits no distension and no mass. There is no tenderness. There is no rebound and no guarding.  Genitourinary: Rectum normal, prostate normal and penis normal. Guaiac negative stool. No penile tenderness.  Musculoskeletal: Normal range of motion. He exhibits no edema and no tenderness.  Lymphadenopathy:    He has no cervical adenopathy.  Neurological: He is alert and oriented to person, place, and time. He has normal  reflexes. No cranial nerve deficit. He exhibits normal muscle tone. Coordination normal.  Skin: Skin is warm and dry. No rash noted. He is not diaphoretic. No erythema. No pallor.  Psychiatric: He has a normal mood and affect. His behavior is normal. Judgment and thought content normal.          Assessment & Plan:  Given a Pneumovax. We spoke at length about the need to get his elevated cholesterol down, but he again declined to take any meds for this. Instead he has committed to a diet and exercise program, and we agreed to check labs again in 90 days.

## 2011-03-03 ENCOUNTER — Other Ambulatory Visit: Payer: Self-pay | Admitting: *Deleted

## 2011-03-03 NOTE — Op Note (Signed)
Luke Skinner, Luke Skinner NO.:  [id number]   MEDICAL RECORD NO.:  [id number]          PATIENT TYPE:  INP   LOCATION:  0009                         FACILITY:  [facility]   PHYSICIAN:  Ollen Gross, M.D.    DATE OF BIRTH:  November 24, 1955   DATE OF PROCEDURE:  09/05/2007  DATE OF DISCHARGE:                               OPERATIVE REPORT   PREOPERATIVE DIAGNOSIS:  Osteoarthritis of the left knee.   POSTOPERATIVE DIAGNOSIS:  Osteoarthritis of the left knee.   PROCEDURE:  Left total knee arthroplasty.   SURGEON:  Ollen Gross, M.D.   ASSISTANT:  Alexzandrew L. Perkins, P.A.C.   ANESTHESIA:  General, with postop Marcaine pain pump.   ESTIMATED BLOOD LOSS:  Minimal.   DRAIN:  None.   TOURNIQUET TIME:  53 minutes at 300 mmHg.   COMPLICATIONS:  None.   CONDITION:  Stable to recovery.   BRIEF CLINICAL NOTE:  Luke Skinner is a 55 year old male who has end-stage  arthritis of the left knee with progressively worsening pain and  dysfunction.  He has failed nonoperative management, including  injections and also has had a past arthroscopy showing degenerative  change.  He has progressive worsening pain and dysfunction and presents  for total knee arthroplasty.   PROCEDURE IN DETAIL:  After successful administration of general  anesthetic, a tourniquet was placed high on the left thigh, and left  lower extremity was prepped and draped in the usual sterile fashion.  Extremity wrapped in Esmarch, knee flexed, tourniquet inflated to 300  mmHg.  Midline incision is made with a 10 blade through subcutaneous  tissue to the level of the extensor mechanism.  A fresh blade was used  make a medial parapatellar arthrotomy.  Soft tissue over the proximal  medial tibia is subperiosteally elevated to the joint line with the  knife and into the semimembranosus bursa with a Cobb elevator.  Soft  tissue laterally is elevated, with attention being paid to avoiding the  patellar tendon on the  tibial tubercle.  Patella is everted laterally,  knee flexed 90 degrees, and PCL removed.  ACL not intact.  The drill was  used create a starting hole in the distal femur, and the canal was  thoroughly irrigated.  The 5-degree left valgus alignment guide is  placed, referencing off the posterior condyles. Rotation is marked, and  a block pinned to remove 11 mm off the distal femur.  I took 11 because  of preop flexion contracture.  Distal femoral resection is made with an  oscillating saw.  Sizing block is placed, and a size 4 is the most  appropriate.  Rotation is marked off the epicondylar axis and a size 4  cutting block placed.  The anterior, posterior, and chamfer cuts are  made.   The tibia is subluxed forward, and the menisci are removed.  Extramedullary tibial alignment guide is placed referencing proximally  at the medial aspect of the tibial tubercle and distally along the  second metatarsal axis and tibial crest.  The block is pinned to remove  about 10 mm off the less-deficient lateral side.  Tibial  resection is  made with an oscillating saw.  The size 4 is the most appropriate tibial  component.  The proximal tibia was attempted to be prepared with the  modular drill, and I noted that we could not fully seat the modular  drill because a screw from a previous reconstruction was blocking.  I  was able to find the screw head and removed the screw.  I was then able  to prepare the proximal tibia with the modular drill and keel punch.  The femoral preparation was then prepared with the intercondylar cut.   The size 4 mobile-bearing tibial trial, size 4 posterior stabilized  femoral trial, and a 12.5 mm posterior stabilized rotating platform  insert trial are placed.  With the 12.5, there was a slight bit of  hyperextension, so went to a 15, which allowed for full extension with  excellent varus and valgus anterior and posterior balance throughout  full range of motion.  The  patella was then everted and the thickness  measured to be 26 mm.  Freehand resection was taken to 15 mm, 38  template was placed, lug holes were drilled, trial patella was placed,  and it tracks normally.  Osteophytes were removed off the posterior  femur with the trial in place.  All trials were removed, and the cut  bone surfaces are prepared with pulsatile lavage.  Cement was mixed and,  once ready implantation, a size 4 mobile bearing tibial tray, size 4  posterior stabilized femur, and 38 patella are cemented into place.  The  patella was held with a clamp.  Trial 15 mm insert was placed, the knee  held in full extension, and all extruded cement removed.  When the  cement was fully hardened, we thoroughly irrigated again with saline and  injected the posterior capsule with FloSeal.  The permanent 15 mm  posterior stabilized rotating platform insert is then placed in the  tibial tray.  FloSeal is then placed in the medial lateral gutters and  suprapatellar area.  Moist sponge is placed, and tourniquet is released  at a total time of 53 minutes.  Sponges held for about 2 minutes,  removed, and there is minimal bleeding encountered.  Any bleeding  encountered is stopped with electrocautery.  I irrigated again and  closed the arthrotomy with interrupted #1 PDS.  Flexion against gravity  to 130 degrees.  Subcutaneous was closed with interrupted 2-0 Vicryl,  and subcuticular running 4-0 Monocryl.  Steri-Strips were placed, and  the Marcaine pain pump was placed and initiated.  Bulky sterile dressing  is applied, and he is placed into a knee immobilizer, awakened, and  transported to recovery in stable condition.      Ollen Gross, M.D.  Electronically Signed     FA/MEDQ  D:  09/05/2007  T:  09/06/2007  Job:  [id number]

## 2011-03-03 NOTE — Discharge Summary (Signed)
NAMEREISS, MOWREY             ACCOUNT NO.:  [id number]   MEDICAL RECORD NO.:  [id number]          PATIENT TYPE:  INP   LOCATION:  1621                         FACILITY:  [facility]   PHYSICIAN:  Ollen Gross, M.D.    DATE OF BIRTH:  August 06, 1956   DATE OF ADMISSION:  09/05/2007  DATE OF DISCHARGE:  09/08/2007                               DISCHARGE SUMMARY   ADMITTING DIAGNOSES:  1. Osteoarthritis left knee.  2. Episodic anxiety.  3. History of bronchitis.  4. History of pneumonia.  5. Hemorrhoids.  6. Hypothyroidism.  7. Degenerative disk disease.   DISCHARGE DIAGNOSES:  1. Osteoarthritis left knee, status post left total knee replacement      arthroplasty.  2. Mild postop blood loss anemia did not require transfusion.  3. Episodic anxiety.  4. History of bronchitis.  5. History of pneumonia.  6. Hemorrhoids.  7. Hypothyroidism.  8. Degenerative disk disease.   PROCEDURE:  September 05, 2007, left total knee.   SURGEON:  Dr. Lequita Halt.   ASSISTANT:  Alexzandrew Perkins, P.A.C.   ANESTHESIA:  General.  Postop Marcaine pain pump.   CONSULTS:  None.   BRIEF HISTORY:  Tudor is a 55 year old male with end stage arthritis of  left knee, progressive worsening pain dysfunction who failed operative  management who now presents for a total knee arthroplasty.   LABORATORY DATA:  Preop CBC showed hemoglobin 12.7, hematocrit 37.5,  white cell count 5.3, red cell count 4.56, platelets 232,000.  Serial  CBCs were followed.  Hemoglobin down to 10.3 postop, then 9.5.  Last  hemoglobin and hematocrit 9.1 and 26.2.  Chem panel on admission all  within normal limits.  Serial basic metabolic panels were followed.  Sodium did drop from 138 to 134 and back up to 136.  PT/INR preop 12.6  and 0.9 with a PTT of 31.  Serial Pro times __________  PT/INR 20.7 and  1.7.  Preop UA negative.  Followup UA:  Large blood, positive protein, 7-  10 white cells, otherwise negative.   X-RAYS:  Chest  x-ray July 11, 2007, stable.  Basilar scarring.  No  active lung disease.  EKG dated December 16, 2006, sinus bradycardia,  intraventricular conduction defect, and lateral T-wave changes are  nonspecific, confirmed.   HOSPITAL COURSE:  The patient admitted to [facility] and  tolerated the procedure well and later transferred to recovery room  orthopedic floor.  Started on PCA and p.o. analgesics for pain control,  given 24 hours postop IV antibiotics, started on Coumadin for DVT  prophylaxis.  The patient did pretty well on the evening of surgery and  then on the morning of day one weaned over to p.m. meds.  Had a little  bit of dilutional low sodium of 134.  We backed off his fluids.  Sodium  came back up.  He actually did extremely well on postop day #1 and got  up and walked about 250 feet.  By day 2, he was doing well.  Dressing  change.  Incision looked good.  He was walking greater than 200 feet,  progressing well.  Hemoglobin was down to 9.5, but he was asymptomatic.  He continued to progress well, and by September 08, 2007, doing well,  tolerating meds, and was ready to go home.   DISCHARGE/PLAN:  1. The patient discharged home on September 08, 2007.  2. Discharge diagnoses:  Please see above.  3. Discharge meds:  Coumadin, Percocet, Robaxin, Nu-Iron.  4. Diet:  Resume home diet.  5. Activities:  Weightbearing as tolerated left lower extremity.  Home      health PT and home health nursing total knee protocol.  6. Followup the week after Thanksgiving.  Please contact the office      for an appointment and time.   DISPOSITION:  Home.   CONDITION ON DISCHARGE:  Improved.      Alexzandrew L. Perkins, P.A.C.      Ollen Gross, M.D.  Electronically Signed    ALP/MEDQ  D:  09/08/2007  T:  09/08/2007  Job:  [id number]   cc:   Tinnie Gens A. Tawanna Cooler, MD  [address] [place]  [place] [address]

## 2011-03-03 NOTE — H&P (Signed)
NAMESHULEM, MADER NO.:  [id number]   MEDICAL RECORD NO.:  [id number]          PATIENT TYPE:  INP   LOCATION:  0009                         FACILITY:  [facility]   PHYSICIAN:  Ollen Gross, M.D.    DATE OF BIRTH:  Sep 24, 1956   DATE OF ADMISSION:  09/05/2007  DATE OF DISCHARGE:                              HISTORY & PHYSICAL   DATE OF OFFICE VISIT AND HISTORY AND PHYSICAL:  August 16, 2007.   CHIEF COMPLAINT:  Left knee pain.   PRESENT ILLNESS:  The patient is a 55 year old male who has been seen by  Dr. Lequita Halt for ongoing left knee problems, progressively gotten worse.  He is known to have end-stage arthritis of the knee and has been treated  conservatively in the past, including surgical treatment of arthroscopy  and also multiple injections.  Despite previous measures, he has had  continued pain, felt to benefit from undergoing surgical intervention.  Risks and benefits discussed, and felt to be an excellent candidate.  He  has also been seen preoperatively by Dr. Tawanna Cooler and felt to be okay for  knee surgery.   ALLERGIES:  SULFA.   CURRENT MEDICATIONS:  Synthroid, Aleve, Lipitor, fexofenadine, enteric-  coated aspirin, ibuprofen, Naftin, Elocon cream, Viagra, acyclovir,  calcium with vitamin D, vitamin E, vitamin C.   PAST MEDICAL HISTORY:  1. Episodic anxiety.  2. History of bronchitis.  3. History of pneumonia.  4. Hemorrhoids.  5. Hypothyroidism.  6. Degenerative disc disease.   PAST SURGICAL HISTORY:  1. 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  2. Left knee surgery x4.  3. Repair of gastrotomy as an adolescent.  4. Hernia repair.  5. Deviated septum.  6. Right shoulder surgery.   SOCIAL HISTORY:  Married, currently unemployed, nonsmoker, no alcohol.  One child.  Wife and mother will be assisting with care after surgery.   FAMILY HISTORY:  Mother with heart disease.  Father deceased with  arthritis and Parkinson's.   REVIEW OF  SYSTEMS:  GENERAL:  No fevers, chills, or night sweats.  NEUROLOGIC:  No seizures, syncope, or paralysis.  RESPIRATORY:  No  shortness of breath, productive cough, or hemoptysis.  CARDIOVASCULAR:  No chest pain, angina, or orthopnea.  GI:  No nausea, vomiting,  diarrhea, or constipation.  GU: No dysuria, hematuria, or discharge.  MUSCULOSKELETAL:  Left knee.   PHYSICAL EXAMINATION:  VITAL SIGNS:  Pulse 72, respirations 14, blood  pressure 142/78.  GENERAL:  A 55 year old white male, well nourished, well developed, in  no acute distress, alert, oriented, and cooperative.  HEENT: Normocephalic, atraumatic.  Pupils round, reactive.  Oropharynx  clear.  EOMs intact.  NECK:  Supple.  CHEST:  Clear anterior posterior chest walls.  No rhonchi, rales, or  wheezing.  HEART:  Regular rhythm, with a faint early systolic ejection murmur,  graded 2/6, best heard over aortic point.  ABDOMEN:  Soft, nontender.  Bowel sounds present.  RECTAL/BREASTS/GENITALIA:  Not done, not pertinent to present illness.  EXTREMITIES:  Left knee:  Left knee shows marked crepitus.  Range of  motion of 5-125.  No instability.  Tender more medial than lateral.   IMPRESSION:  1. Osteoarthritis left knee.  2. Episodic anxiety.  3. History of bronchitis.  4. History of pneumonia.  5. Hemorrhoids.  6. Hypothyroidism.  7. Degenerative disc disease.   PLAN:  The patient will be admitted to [facility] to undergo a  left total knee replacement arthroplasty.  Surgery will be performed by  Dr. Ollen Gross.      Alexzandrew L. Perkins, P.A.C.      Ollen Gross, M.D.  Electronically Signed    ALP/MEDQ  D:  09/04/2007  T:  09/05/2007  Job:  [id number]   cc:   Tinnie Gens A. Tawanna Cooler, MD  [address] [place]  [place] [address]

## 2011-03-04 ENCOUNTER — Other Ambulatory Visit: Payer: Self-pay | Admitting: *Deleted

## 2011-03-04 MED ORDER — LEVOTHYROXINE SODIUM 150 MCG PO TABS: 150.0000 ug | ORAL_TABLET | Freq: Every day | ORAL | Status: DC

## 2011-03-04 MED ORDER — SIMVASTATIN 40 MG PO TABS: 40.0000 mg | ORAL_TABLET | Freq: Every day | ORAL | Status: DC

## 2011-03-04 NOTE — Telephone Encounter (Signed)
Patient would like a 90 day supply.

## 2011-03-06 NOTE — Discharge Summary (Signed)
Luke Skinner, Luke Skinner             ACCOUNT NO.:  [id number]   MEDICAL RECORD NO.:  [id number]          PATIENT TYPE:  INP   LOCATION:  5705                         FACILITY:  [facility]   PHYSICIAN:  Rene Paci, M.D. LHCDATE OF BIRTH:  08-12-1956   DATE OF ADMISSION:  03/23/2005  DATE OF DISCHARGE:  03/26/2005                                 DISCHARGE SUMMARY   DISCHARGE DIAGNOSES:  1.  Gastroenteritis.  2.  Dehydration.   HISTORY OF PRESENT ILLNESS:  Patient is a 55 year old male who presented  with a two-day history of diarrhea and severe nonvertiginous quality  dizziness.  Patient also had low grade fever and chills and moderate  associated cramping in the abdomen.  In addition, patient had diffuse  myalgias.   PAST MEDICAL HISTORY:  1.  Hypothyroidism.  2.  Dyslipidemia.  3.  Hyperlipidemia.   HOSPITAL COURSE:  Patient was admitted and placed on IV hydration as well as  anti-emetics.  Patient had multiple loose stools on March 25, 2005, however,  no vomiting.  Diet was advanced as tolerated.  Patient is currently  tolerating regular diet at this time.  Patient has had no loose stools on  day of discharge.  Patient states he is feeling much better.  No longer  experiencing myalgias.  Plan to discharge patient to home.  Patient is  instructed to call Dr. Tawanna Cooler should nausea, vomiting, or loose stools recur.   DISCHARGE MEDICATIONS:  1.  Synthroid 150 mcg daily.  2.  Lipitor 10 mg daily.   DISCHARGE LABORATORIES:  Hemoglobin 11.9, hematocrit 34.6.  BUN 7,  creatinine 1.   FOLLOW-UP:  Patient is to follow up with Dr. Tawanna Cooler on June 19 at 10:15 a.m.  At this time patient may need additional work-up for incidental proteinuria  which was noted on admission.  May have been secondary to acute illness.       MSO/MEDQ  D:  03/26/2005  T:  03/26/2005  Job:  [id number]   cc:   Tinnie Gens A. Tawanna Cooler, M.D. [facility]

## 2011-03-06 NOTE — H&P (Signed)
NAMEJESS, Luke Skinner             ACCOUNT NO.:  [id number]   MEDICAL RECORD NO.:  [id number]          PATIENT TYPE:  EMS   LOCATION:  MAJO                         FACILITY:  [facility]   PHYSICIAN:  Sean A. Everardo All, M.D. [facility] OF BIRTH:  1956-04-26   DATE OF ADMISSION:  03/23/2005  DATE OF DISCHARGE:                                HISTORY & PHYSICAL   REASON FOR ADMISSION:  Hypotension.   HISTORY OF PRESENT ILLNESS:  The patient is a 55 year old man with two days  of diarrhea and severe nonvertiginous quality dizziness.  He also has low  grade fever and chills.  He also has some moderate associated cramps in the  abdomen which are generalized throughout the abdomen.  On further  questioning he states he has some diffuse myalgias with a crampy quality.   PAST MEDICAL HISTORY:  1.  Hypothyroidism.  2.  Dyslipidemia.   MEDICATIONS:  1.  Synthroid 150 mcg a day.  2.  Lipitor 10 mg a day.   SOCIAL HISTORY:  He is married.  His wife is here.  He works at a [employer].   FAMILY HISTORY:  The patient's daughter has a similar illness.   REVIEW OF SYSTEMS:  Denies the following:  Nausea, vomiting, loss of  consciousness, rectal bleeding, hematuria, weight loss blurry vision,  dysuria, sore throat, skin rash, excessive diaphoresis and seizure.   PHYSICAL EXAMINATION:  VITAL SIGNS:  Blood pressure upon arrival is 85/52  sitting.  It has improved with intravenous fluid, but is still in the  upright posture.  Heart rate is 87, respiratory rate 20, temperature is  99.2.  GENERAL:  Appears slightly ill.  Skin not diaphoretic.  I do not see a rash.  HEENT:  No scleral icterus.  Pharynx:  Mucous membrane are dry.  I do not  appreciate erythema of the mucous membranes.  NECK:  Supple.  CHEST:  Clear to auscultation.  CARDIOVASCULAR:  No jugular venous distention, no edema, regular rate and  rhythm, no murmur.  Pedal pulses are intact.  ABDOMEN:  Soft.  There is minimal diffuse  tenderness.  It is not distended.  No hepatosplenomegaly, no mass.  GENITORECTAL:  Examination not done at this time due to patient's condition.  EXTREMITIES:  No deformity is seen.  NEUROLOGIC:  Alert and oriented.  Cranial nerves appear to be intact and he  readily moves all fours.   LABORATORY DATA:  White blood cell count 13,800, hemoglobin 13.9.  Urinalysis negative except for protein.  BMEG is normal.   IMPRESSION:  1.  Diarrhea which is most likely of a viral etiology.  2.  Hypotension due to #1.  3.  Proteinuria is incidentally noted.  4.  Chronic medical problems of dyslipidemia and hypothyroidism.   PLAN:  1.  Admit to any medical bed.  2.  Intravenous fluids.  3.  Symptomatic therapy.  4.  I discussed code status with patient and he requests full code; however,      he states he would not want to be started or maintained on artificial      life  support measures if there was not a reasonable chance of a      functional recovery.  5.  Follow-up proteinuria as an outpatient, if indicated.      SAE/MEDQ  D:  03/24/2005  T:  03/24/2005  Job:  [id number]   cc:   Tinnie Gens A. Tawanna Cooler, M.D. [facility]

## 2011-03-06 NOTE — Assessment & Plan Note (Signed)
[facility]                                 ON-CALL NOTE   TRAMAIN, GERSHMAN                    MRN:          [id number]  DATE:01/04/2007                            DOB:          [birth year]    A Dr. Tawanna Cooler patient.  Phone number is [phone].  A patient of Dr. Tawanna Cooler  who has had some difficulty with job stress, who was referred to Dr.  Dellia Cloud.  However, the appointment kept having to be changed, and he  has had an extremely difficult day and is very stressed.  He actually  has physical symptoms related to that, and wonders what he should do.  His appointment has been moved to April and will have to be changed  again.  He feels he needs extra help.  He does not feel it is an  emergency room visit type of problem, however.  I believe he should be  seen tomorrow by Dr. Tawanna Cooler, or at least have him discuss this difficulty  to see what other intervention is possible until he is able to see Dr.  Dellia Cloud.  Will have the patient call first thing in the morning,  possibly to see Dr. Tawanna Cooler, and I will try to contact Dr. Tawanna Cooler tomorrow in  the office.     Neta Mends. Panosh, MD  Electronically Signed    WKP/MedQ  DD: 01/05/2007  DT: 01/06/2007  Job #: [id number]

## 2011-03-06 NOTE — Op Note (Signed)
NAMEDIVON, KRABILL             ACCOUNT NO.:  [id number]   MEDICAL RECORD NO.:  [id number]          PATIENT TYPE:  AMB   LOCATION:  DAY                          FACILITY:  [facility]   PHYSICIAN:  Ollen Gross, M.D.    DATE OF BIRTH:  12/28/1955   DATE OF PROCEDURE:  07/23/2004  DATE OF DISCHARGE:                                 OPERATIVE REPORT   PREOPERATIVE DIAGNOSES:  Right shoulder impingement, acromioclavicular  arthrosis and labral tear.   POSTOPERATIVE DIAGNOSES:  Right shoulder impingement, acromioclavicular  arthrosis and labral tear.   PROCEDURE:  Right shoulder arthroscopy, labral debridement, subacromial  decompression and distal clavicle resection.   SURGEON:  Ollen Gross, M.D.   ASSISTANT:  Avel Peace, PA-C.   ANESTHESIA:  General with interscalene block.   ESTIMATED BLOOD LOSS:  Minimal.   DRAINS:  None.   COMPLICATIONS:  None.   CONDITION:  Stable to recovery.   CLINICAL NOTE:  Luke Skinner is a 55 year old male who has had a long history of  progressively worsening right shoulder pain and catching.  He has failed  injection management and physical therapy.  His MRI shows labral tearing  with possible degenerative change in the glenohumeral joint as well as  impingement morphology in the subacromial space.  He presents now for the  above-mentioned procedure.   PROCEDURE IN DETAIL:  After successful administration of interscalene block  and general anesthetic, the patient is placed in the upright and the beach-  chair position.  The right upper extremity and shoulder girdle isolated  __________ drapes, and prepped and draped in the usual sterile fashion.  Arthroscopic landmarks are drawn, and incision is made for the posterior  portal.  Camera and cannula are then passed into the joint.  Arthroscopic  visualization then proceeds.  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  The humeral head has very little  low grade chondromalacia, otherwise appears normal.  Biceps tendons normal.  There is labral tearing and fraying in the superior labrum.  The anterior  labrum looks normal.  Inferior labrum also looks normal as does the  posterior.  The shoulder is abducted, and there is some under-surface  fraying on the articular surface of the rotator cuff.  At the supraspinatus  tendon, a very small 1 cm area.  The rest of the cuff looks fine.  A spinal  needle was then used to focalize the anterior portal.  A small incision  made.  A __________ cannula passed.  Then a probe passed in the joint to the  area of the rotator cuff tear.  It does not go beyond very mild partial  thickness.  I then used a combination of shaver and ArthroCare device to  debride the torn tissue back to a stable base.  There was still superior  labrum remaining after we had debrided it.  A biceps anchor remained intact.  The anterior labrum remained intact.  That surface scuffing at the under-  surface of the supraspinatus is debrided  back to a stable tendinous base  with minimal loss of tissue.  I also used a shaver to debride the areas of  chondromalacia on the glenoid surface back to stable cartilaginous base.   We exited the glenohumeral joint and entered the subacromial space and a  tremendous amount of inflamed bursal tissue.  Soft tissue decompression is  performed using the ArthroCare device.  We did this through the anterior  portal and then also created a lateral portal to complete the soft tissue  decompression.  The bony subacromial decompression is performed with a 4 mm  bur to create a flat undersurface to the acromion.  We then passed the bur  to the anterior portal and removed about 1 cm of distal clavicle, thus  decompressing the AC joint.  The rotator cuff is visualized from the bursal  surface, and there is no tear.  Arthroscopic  equipment is then removed from  the subacromial space, and the portal sites are closed with interrupted 4-0  nylon.  A bulky sterile dressing is applied and is placed into a sling.  Awakened and transported to recovery in stable condition.  He will be  discharged home tonight.      FA/MEDQ  D:  07/23/2004  T:  07/23/2004  Job:  [id number]

## 2011-05-06 ENCOUNTER — Telehealth: Payer: Self-pay | Admitting: Family Medicine

## 2011-05-06 NOTE — Telephone Encounter (Signed)
Pt called 7/18. He recently had a physical where he and Dr. Clent Ridges discussed putting him back on Viagra. Last Rx was 2009. He is does want to either get back on Viagra or a similar med. He would like to discuss this with Dr. Clent Ridges. Please call pt.

## 2011-05-08 NOTE — Telephone Encounter (Signed)
Script called in and left voice message for pt. 

## 2011-05-08 NOTE — Telephone Encounter (Signed)
Call in Viagra 100 mg to use prn, #10 with 11 rf  

## 2011-07-28 LAB — CBC
HCT: 27.1 — ABNORMAL LOW
HCT: 37.5 — ABNORMAL LOW
Hemoglobin: 9.1 — ABNORMAL LOW
MCHC: 34.4
MCHC: 34.9
MCHC: 35.1
MCHC: 33.8
MCV: 82.3
MCV: 82.3
MCV: 82.2
Platelets: 210
Platelets: 247
Platelets: 232
RBC: 3.29 — ABNORMAL LOW
RDW: 13.3
RDW: 13.9
RDW: 13

## 2011-07-28 LAB — COMPREHENSIVE METABOLIC PANEL
Albumin: 3.8
BUN: 15
Calcium: 9.3
Creatinine, Ser: 0.88
Glucose, Bld: 114 — ABNORMAL HIGH
Potassium: 3.9
Total Protein: 6.4

## 2011-07-28 LAB — ABO/RH: ABO/RH(D): O POS

## 2011-07-28 LAB — URINALYSIS, ROUTINE W REFLEX MICROSCOPIC
Bilirubin Urine: NEGATIVE
Ketones, ur: NEGATIVE
Nitrite: NEGATIVE
Nitrite: NEGATIVE
Protein, ur: NEGATIVE
Specific Gravity, Urine: 1.023
Urobilinogen, UA: 0.2
Urobilinogen, UA: 0.2

## 2011-07-28 LAB — BASIC METABOLIC PANEL
BUN: 15
BUN: 9
CO2: 27
CO2: 29
Calcium: 8.3 — ABNORMAL LOW
Chloride: 103
Creatinine, Ser: 0.99
Creatinine, Ser: 1.03
GFR calc Af Amer: >60
Glucose, Bld: 129 — ABNORMAL HIGH

## 2011-07-28 LAB — TYPE AND SCREEN: ABO/RH(D): O POS

## 2011-07-28 LAB — PROTIME-INR
INR: 1.1
INR: 1.7 — ABNORMAL HIGH
INR: 0.9
Prothrombin Time: 14.1
Prothrombin Time: 19 — ABNORMAL HIGH
Prothrombin Time: 12.6

## 2011-07-28 LAB — APTT: aPTT: 31

## 2011-09-02 ENCOUNTER — Telehealth: Payer: Self-pay | Admitting: Family Medicine

## 2011-09-02 DIAGNOSIS — E785 Hyperlipidemia, unspecified: Secondary | ICD-10-CM

## 2011-09-02 NOTE — Telephone Encounter (Signed)
Pt had elevated chole in 02-2011. Pt was suppose to come back in 90 day for repeat labs. Pt is sch for labs on 09-07-11. Please put order in system for bloodwork.

## 2011-09-03 NOTE — Telephone Encounter (Signed)
Order placed

## 2011-09-07 ENCOUNTER — Other Ambulatory Visit: Payer: Self-pay | Admitting: Family Medicine

## 2011-09-07 ENCOUNTER — Other Ambulatory Visit (INDEPENDENT_AMBULATORY_CARE_PROVIDER_SITE_OTHER): Payer: 59

## 2011-09-07 DIAGNOSIS — E785 Hyperlipidemia, unspecified: Secondary | ICD-10-CM

## 2011-09-07 LAB — LIPID PANEL
Cholesterol: 203 mg/dL — ABNORMAL HIGH (ref 0–200)
Total CHOL/HDL Ratio: 5
Triglycerides: 114 mg/dL (ref 0.0–149.0)

## 2011-09-09 ENCOUNTER — Telehealth: Payer: Self-pay | Admitting: Family Medicine

## 2011-09-09 NOTE — Telephone Encounter (Signed)
Spoke with pt

## 2011-09-09 NOTE — Telephone Encounter (Signed)
See the lab report 

## 2011-09-09 NOTE — Telephone Encounter (Signed)
Requesting lab results from Monday. Thanks.

## 2011-09-09 NOTE — Progress Notes (Signed)
Quick Note:  I spoke with pt and gave results. Pt declined to start on the medication right now. He is trying to lose weight and eat healthier. I put a copy in mail per pt request. ______

## 2011-09-15 ENCOUNTER — Ambulatory Visit (INDEPENDENT_AMBULATORY_CARE_PROVIDER_SITE_OTHER): Payer: 59 | Admitting: Family Medicine

## 2011-09-15 ENCOUNTER — Ambulatory Visit: Payer: 59 | Admitting: Family Medicine

## 2011-09-15 ENCOUNTER — Encounter: Payer: Self-pay | Admitting: Family Medicine

## 2011-09-15 VITALS — BP 132/84 | HR 75 | Temp 98.6°F | Wt 226.0 lb

## 2011-09-15 DIAGNOSIS — M546 Pain in thoracic spine: Secondary | ICD-10-CM

## 2011-09-15 MED ORDER — ETODOLAC 500 MG PO TABS: 500.0000 mg | ORAL_TABLET | Freq: Two times a day (BID) | ORAL | Status: DC

## 2011-09-15 NOTE — Progress Notes (Signed)
  Subjective:    Patient ID: Luke Skinner, male    DOB: September 01, 1956, 55 y.o.   MRN: [id number]  HPI Here for 2 weeks of intermittent sharp well localized pains in the left middle back. No recent trauma. Using Aleve. No cough or SOB.    Review of Systems  Constitutional: Negative.   Respiratory: Negative.   Cardiovascular: Negative.   Musculoskeletal: Positive for back pain.       Objective:   Physical Exam  Constitutional: He appears well-developed and well-nourished.  Cardiovascular: Normal rate, regular rhythm, normal heart sounds and intact distal pulses.   Pulmonary/Chest: Effort normal and breath sounds normal.  Musculoskeletal: Normal range of motion. He exhibits no edema and no tenderness.  Skin: No rash noted.          Assessment & Plan:  Probable pinched nerve in the thoracic spine. Recheck prn

## 2012-02-23 ENCOUNTER — Encounter: Payer: Self-pay | Admitting: Family Medicine

## 2012-02-23 ENCOUNTER — Ambulatory Visit (INDEPENDENT_AMBULATORY_CARE_PROVIDER_SITE_OTHER): Payer: 59 | Admitting: Family Medicine

## 2012-02-23 VITALS — BP 140/82 | HR 91 | Temp 98.4°F | Wt 226.0 lb

## 2012-02-23 DIAGNOSIS — J329 Chronic sinusitis, unspecified: Secondary | ICD-10-CM

## 2012-02-23 MED ORDER — AZITHROMYCIN 250 MG PO TABS: ORAL_TABLET | ORAL | Status: DC

## 2012-02-24 ENCOUNTER — Encounter: Payer: Self-pay | Admitting: Family Medicine

## 2012-02-24 NOTE — Progress Notes (Signed)
  Subjective:    Patient ID: Luke Skinner, male    DOB: 02/06/1956, 56 y.o.   MRN: [id number]  HPI Here for one week of sinus pressure, PND, ST, and a dry cough.    Review of Systems  Constitutional: Negative.   HENT: Positive for congestion, postnasal drip and sinus pressure.   Eyes: Negative.   Respiratory: Positive for cough.        Objective:   Physical Exam  Constitutional: He appears well-developed and well-nourished.  HENT:  Right Ear: External ear normal.  Left Ear: External ear normal.  Nose: Nose normal.  Mouth/Throat: Oropharynx is clear and moist. No oropharyngeal exudate.  Eyes: Conjunctivae are normal.  Pulmonary/Chest: Effort normal and breath sounds normal.  Lymphadenopathy:    He has no cervical adenopathy.          Assessment & Plan:  Add Mucinex D prn

## 2012-05-03 ENCOUNTER — Encounter: Payer: 59 | Admitting: Family Medicine

## 2012-05-05 ENCOUNTER — Other Ambulatory Visit (INDEPENDENT_AMBULATORY_CARE_PROVIDER_SITE_OTHER): Payer: 59

## 2012-05-05 DIAGNOSIS — Z0000 Encounter for general adult medical examination without abnormal findings: Secondary | ICD-10-CM

## 2012-05-05 LAB — POCT URINALYSIS DIPSTICK
Bilirubin, UA: NEGATIVE
Blood, UA: NEGATIVE
Glucose, UA: NEGATIVE
Ketones, UA: NEGATIVE
Nitrite, UA: NEGATIVE
Spec Grav, UA: 1.025
pH, UA: 5.5

## 2012-05-05 LAB — CBC WITH DIFFERENTIAL/PLATELET
Basophils Relative: 0.5 % (ref 0.0–3.0)
Eosinophils Absolute: 0.2 10*3/uL (ref 0.0–0.7)
Eosinophils Relative: 2.9 % (ref 0.0–5.0)
HCT: 41.5 % (ref 39.0–52.0)
Lymphs Abs: 2.3 10*3/uL (ref 0.7–4.0)
MCHC: 32.5 g/dL (ref 30.0–36.0)
MCV: 85.2 fl (ref 78.0–100.0)
Monocytes Absolute: 0.6 10*3/uL (ref 0.1–1.0)
Neutrophils Relative %: 47.2 % (ref 43.0–77.0)
Platelets: 243 10*3/uL (ref 150.0–400.0)
RBC: 4.88 Mil/uL (ref 4.22–5.81)
WBC: 5.9 10*3/uL (ref 4.5–10.5)

## 2012-05-05 LAB — BASIC METABOLIC PANEL
BUN: 19 mg/dL (ref 6–23)
CO2: 28 mEq/L (ref 19–32)
Chloride: 102 mEq/L (ref 96–112)
Creatinine, Ser: 1 mg/dL (ref 0.4–1.5)
Potassium: 4.7 mEq/L (ref 3.5–5.1)

## 2012-05-05 LAB — TSH: TSH: 2.64 u[IU]/mL (ref 0.35–5.50)

## 2012-05-05 LAB — LDL CHOLESTEROL, DIRECT: Direct LDL: 138.7 mg/dL

## 2012-05-05 LAB — LIPID PANEL
Cholesterol: 225 mg/dL — ABNORMAL HIGH (ref 0–200)
Triglycerides: 114 mg/dL (ref 0.0–149.0)

## 2012-05-05 LAB — HEPATIC FUNCTION PANEL
ALT: 36 U/L (ref 0–53)
Total Protein: 6.8 g/dL (ref 6.0–8.3)

## 2012-05-06 NOTE — Progress Notes (Signed)
Quick Note:  I spoke with pt ______ 

## 2012-05-16 ENCOUNTER — Other Ambulatory Visit: Payer: Self-pay | Admitting: Family Medicine

## 2012-05-17 ENCOUNTER — Ambulatory Visit (INDEPENDENT_AMBULATORY_CARE_PROVIDER_SITE_OTHER): Payer: 59 | Admitting: Family Medicine

## 2012-05-17 ENCOUNTER — Encounter: Payer: Self-pay | Admitting: Family Medicine

## 2012-05-17 VITALS — BP 132/80 | HR 72 | Temp 98.5°F | Ht 69.5 in | Wt 226.0 lb

## 2012-05-17 DIAGNOSIS — Z0000 Encounter for general adult medical examination without abnormal findings: Secondary | ICD-10-CM

## 2012-05-17 MED ORDER — ACYCLOVIR 200 MG PO CAPS: 200.0000 mg | ORAL_CAPSULE | ORAL | Status: DC | PRN

## 2012-05-17 MED ORDER — MOMETASONE FUROATE 0.1 % EX CREA: TOPICAL_CREAM | CUTANEOUS | Status: DC | PRN

## 2012-05-17 MED ORDER — LEVOTHYROXINE SODIUM 150 MCG PO TABS: 150.0000 ug | ORAL_TABLET | Freq: Every day | ORAL | Status: DC

## 2012-05-17 MED ORDER — NAFTIFINE HCL 1 % EX CREA: TOPICAL_CREAM | CUTANEOUS | Status: DC | PRN

## 2012-05-17 MED ORDER — SILDENAFIL CITRATE 50 MG PO TABS: 25.0000 mg | ORAL_TABLET | Freq: Every day | ORAL | Status: DC | PRN

## 2012-05-17 NOTE — Progress Notes (Signed)
  Subjective:    Patient ID: Luke Skinner, male    DOB: 1956-07-06, 56 y.o.   MRN: [id number]  HPI 56 yr old male for a cpx. He feels well and has no concerns.    Review of Systems  Constitutional: Negative.   HENT: Negative.   Eyes: Negative.   Respiratory: Negative.   Cardiovascular: Negative.   Gastrointestinal: Negative.   Genitourinary: Negative.   Musculoskeletal: Negative.   Skin: Negative.   Neurological: Negative.   Hematological: Negative.   Psychiatric/Behavioral: Negative.        Objective:   Physical Exam  Constitutional: He is oriented to person, place, and time. He appears well-developed and well-nourished. No distress.  HENT:  Head: Normocephalic and atraumatic.  Right Ear: External ear normal.  Left Ear: External ear normal.  Nose: Nose normal.  Mouth/Throat: Oropharynx is clear and moist. No oropharyngeal exudate.  Eyes: Conjunctivae and EOM are normal. Pupils are equal, round, and reactive to light. Right eye exhibits no discharge. Left eye exhibits no discharge. No scleral icterus.  Neck: Neck supple. No JVD present. No tracheal deviation present. No thyromegaly present.  Cardiovascular: Normal rate, regular rhythm, normal heart sounds and intact distal pulses.  Exam reveals no gallop and no friction rub.   No murmur heard.      EKG shows stable incomplete RBBB  Pulmonary/Chest: Effort normal and breath sounds normal. No respiratory distress. He has no wheezes. He has no rales. He exhibits no tenderness.  Abdominal: Soft. Bowel sounds are normal. He exhibits no distension and no mass. There is no tenderness. There is no rebound and no guarding.  Genitourinary: Rectum normal, prostate normal and penis normal. Guaiac negative stool. No penile tenderness.  Musculoskeletal: Normal range of motion. He exhibits no edema and no tenderness.  Lymphadenopathy:    He has no cervical adenopathy.  Neurological: He is alert and oriented to person, place, and time.  He has normal reflexes. No cranial nerve deficit. He exhibits normal muscle tone. Coordination normal.  Skin: Skin is warm and dry. No rash noted. He is not diaphoretic. No erythema. No pallor.  Psychiatric: He has a normal mood and affect. His behavior is normal. Judgment and thought content normal.          Assessment & Plan:  Well exam.

## 2012-07-05 ENCOUNTER — Encounter: Payer: Self-pay | Admitting: Family Medicine

## 2012-07-05 ENCOUNTER — Ambulatory Visit (INDEPENDENT_AMBULATORY_CARE_PROVIDER_SITE_OTHER): Payer: 59 | Admitting: Family Medicine

## 2012-07-05 VITALS — BP 140/84 | HR 79 | Temp 98.8°F | Wt 228.0 lb

## 2012-07-05 DIAGNOSIS — I10 Essential (primary) hypertension: Secondary | ICD-10-CM

## 2012-07-05 MED ORDER — AMLODIPINE BESYLATE 5 MG PO TABS: 5.0000 mg | ORAL_TABLET | Freq: Every day | ORAL | Status: DC

## 2012-07-05 NOTE — Progress Notes (Signed)
  Subjective:    Patient ID: Luke Skinner, male    DOB: May 06, 1956, 56 y.o.   MRN: [id number]  HPI Here for several weeks of BP elevations in the 140s to 150s over 90s. He feels a bit lightheaded at times but no HA or chest pain or SOB. He took Lisinopril briefly several years ago.    Review of Systems  Constitutional: Negative.   HENT: Negative.   Eyes: Negative.   Respiratory: Negative.   Cardiovascular: Negative.   Neurological: Positive for light-headedness. Negative for dizziness, tremors, seizures, syncope, facial asymmetry, speech difficulty, weakness, numbness and headaches.       Objective:   Physical Exam  Constitutional: He appears well-developed and well-nourished.  Neck: No thyromegaly present.  Cardiovascular: Normal rate, regular rhythm, normal heart sounds and intact distal pulses.   No murmur heard. Pulmonary/Chest: Effort normal.  Musculoskeletal: He exhibits no edema.  Lymphadenopathy:    He has no cervical adenopathy.          Assessment & Plan:  Try Amlodipine 5 mg a day. Recheck one month

## 2012-07-13 ENCOUNTER — Telehealth: Payer: Self-pay | Admitting: Family Medicine

## 2012-07-13 MED ORDER — HYDROCHLOROTHIAZIDE 25 MG PO TABS: 25.0000 mg | ORAL_TABLET | Freq: Every day | ORAL | Status: DC

## 2012-07-13 NOTE — Telephone Encounter (Signed)
Pt called and said that the bp med made pt really dizzy so pt discontinued med. Pt is wondering if it could be thryoid problems and is req to get labs drawn.

## 2012-07-13 NOTE — Telephone Encounter (Signed)
No need for more labs because we checked these (including his thyroid) in July. I suggest he try taking HCTZ 25 mg a day. Call in a 90 day supply

## 2012-07-13 NOTE — Telephone Encounter (Signed)
I spoke with pt and sent script e-scribe. 

## 2012-07-20 ENCOUNTER — Telehealth: Payer: Self-pay | Admitting: Family Medicine

## 2012-07-20 DIAGNOSIS — I10 Essential (primary) hypertension: Secondary | ICD-10-CM

## 2012-07-20 NOTE — Telephone Encounter (Signed)
Tell him I have put in orders to get a complete thyroid panel so he just needs to set up a lab appt. For his HTN, I have stopped the HCTZ so please cal in Losartan 50 mg a day, #30 with 2 rf.

## 2012-07-21 MED ORDER — LOSARTAN POTASSIUM 50 MG PO TABS: 50.0000 mg | ORAL_TABLET | Freq: Every day | ORAL | Status: DC

## 2012-07-21 NOTE — Telephone Encounter (Signed)
I left voice message with below message and sent script -scribe.

## 2012-07-22 ENCOUNTER — Other Ambulatory Visit (INDEPENDENT_AMBULATORY_CARE_PROVIDER_SITE_OTHER): Payer: 59

## 2012-07-22 DIAGNOSIS — I10 Essential (primary) hypertension: Secondary | ICD-10-CM

## 2012-07-22 LAB — T4, FREE: Free T4: 0.88 ng/dL (ref 0.60–1.60)

## 2012-07-22 LAB — T3, FREE: T3, Free: 2.6 pg/mL (ref 2.3–4.2)

## 2012-07-22 NOTE — Progress Notes (Signed)
Quick Note:  I spoke with pt ______ 

## 2012-08-23 ENCOUNTER — Telehealth: Payer: Self-pay | Admitting: Family Medicine

## 2012-08-23 NOTE — Telephone Encounter (Signed)
Caller: Kaylob/Patient; Patient Name: Renault, Luke Skinner; PCP: Gershon Crane [facility]); Best Callback Phone Number: [phone] Calling regarding cough with production of mucus he thinks is clear, sore throat and headache that started 08/22/12, afebrile. Took Tylenol and Sudafed 08/22/12, nothing today 08/23/12. Emergent signs and symptoms ruled out as per Sore Throat protocol and home care advice given with call back parameters.

## 2012-08-26 ENCOUNTER — Encounter: Payer: Self-pay | Admitting: Family Medicine

## 2012-08-26 ENCOUNTER — Ambulatory Visit (INDEPENDENT_AMBULATORY_CARE_PROVIDER_SITE_OTHER): Payer: 59 | Admitting: Family Medicine

## 2012-08-26 VITALS — BP 138/70 | HR 81 | Temp 98.7°F | Wt 232.0 lb

## 2012-08-26 DIAGNOSIS — J40 Bronchitis, not specified as acute or chronic: Secondary | ICD-10-CM

## 2012-08-26 MED ORDER — AZITHROMYCIN 250 MG PO TABS: ORAL_TABLET | ORAL | Status: DC

## 2012-08-26 MED ORDER — HYDROCODONE-HOMATROPINE 5-1.5 MG/5ML PO SYRP: 5.0000 mL | ORAL_SOLUTION | ORAL | Status: AC | PRN

## 2012-08-26 NOTE — Progress Notes (Signed)
  Subjective:    Patient ID: Luke Skinner, male    DOB: [birth year], 56 y.o.   MRN: [id number]  HPI Here for 5 days of PND, ST, chest tightness and coughing up yellow sputum. No fever   Review of Systems  Constitutional: Negative.   HENT: Positive for congestion, postnasal drip and sinus pressure.   Eyes: Negative.   Respiratory: Positive for cough.        Objective:   Physical Exam  Constitutional: He appears well-developed and well-nourished.  HENT:  Right Ear: External ear normal.  Left Ear: External ear normal.  Nose: Nose normal.  Mouth/Throat: Oropharynx is clear and moist.  Eyes: Conjunctivae normal are normal.  Pulmonary/Chest: Effort normal. No respiratory distress. He has no wheezes. He has no rales.       Scattered rhonchi   Lymphadenopathy:    He has no cervical adenopathy.          Assessment & Plan:  Add Mucinex

## 2012-08-31 ENCOUNTER — Telehealth: Payer: Self-pay | Admitting: Family Medicine

## 2012-08-31 MED ORDER — AZITHROMYCIN 250 MG PO TABS: ORAL_TABLET | ORAL | Status: AC

## 2012-08-31 NOTE — Telephone Encounter (Signed)
I sent script e-scribe and spoke with pt. 

## 2012-08-31 NOTE — Telephone Encounter (Signed)
Pt called and said that he has completed the azithromycin (ZITHROMAX) 250 MG tablet. The bronchitus symptoms have improved some but not completely. Pt is req refill for the abx to be called in to [facility] on [address]. Pt says that he still has half a bottle of the cough syrup remaining. Pt would like to be notified when done.

## 2012-08-31 NOTE — Telephone Encounter (Signed)
Call in another Zpack  

## 2012-09-19 ENCOUNTER — Ambulatory Visit (INDEPENDENT_AMBULATORY_CARE_PROVIDER_SITE_OTHER): Payer: 59 | Admitting: Family Medicine

## 2012-09-19 ENCOUNTER — Encounter: Payer: Self-pay | Admitting: Family Medicine

## 2012-09-19 ENCOUNTER — Other Ambulatory Visit: Payer: Self-pay | Admitting: Family Medicine

## 2012-09-19 VITALS — BP 168/74 | HR 80 | Temp 98.2°F | Wt 235.0 lb

## 2012-09-19 DIAGNOSIS — I10 Essential (primary) hypertension: Secondary | ICD-10-CM

## 2012-09-19 MED ORDER — ACYCLOVIR 200 MG PO CAPS: 200.0000 mg | ORAL_CAPSULE | ORAL | Status: DC | PRN

## 2012-09-19 MED ORDER — LOSARTAN POTASSIUM-HCTZ 50-12.5 MG PO TABS: 1.0000 | ORAL_TABLET | Freq: Every day | ORAL | Status: DC

## 2012-09-19 MED ORDER — METOPROLOL SUCCINATE ER 25 MG PO TB24: 25.0000 mg | ORAL_TABLET | Freq: Every day | ORAL | Status: DC

## 2012-09-19 NOTE — Progress Notes (Signed)
  Subjective:    Patient ID: Decorey Wahlert Bosch, male    DOB: 1956/04/30, 56 y.o.   MRN: [id number]  HPI Here to follow up HTN. He has been taking Losartan and tolerates it fairly well. However he has been getting systolic readings of 160s to 180s lately with normal diastolics. He tried Amlodipine and Lisinopril but had lightheadedness and weakness as side effects.    Review of Systems  Constitutional: Negative.   Respiratory: Negative.   Cardiovascular: Negative.   Neurological: Negative.        Objective:   Physical Exam  Constitutional: He appears well-developed and well-nourished.  Neck: No thyromegaly present.  Cardiovascular: Normal rate, regular rhythm, normal heart sounds and intact distal pulses.   Pulmonary/Chest: Effort normal and breath sounds normal.  Lymphadenopathy:    He has no cervical adenopathy.          Assessment & Plan:  Add HCTZ to the Losartan. Recheck in 2 weeks

## 2012-09-19 NOTE — Addendum Note (Signed)
Addended by: Aniceto Boss A on: 09/19/2012 05:25 PM   Modules accepted: Orders

## 2012-09-19 NOTE — Telephone Encounter (Signed)
Cancel the order for Losartan HCT. Call in Metoprolol succinate 25 mg daily, #30 with 2 rf. Also refill Acycolvir 200 mg #60 with 11 rf

## 2012-09-19 NOTE — Telephone Encounter (Signed)
Pharmacy sent over a fax stating that pt is allergic to Sulfa and could show a cross-sensitivity to Losartan-HCTZ. Do you want to change to something else? Pt also said that he needed a refill on Zoviraz.

## 2012-09-27 ENCOUNTER — Telehealth: Payer: Self-pay | Admitting: Family Medicine

## 2012-09-27 NOTE — Telephone Encounter (Signed)
Patient calling about hypertension.  States recently changed BP medications, and has finally settling on metoprolol 25mg  qd, which the patient states has not changed his blood pressure in 10 days, and makes him feel terrible.  BP still in the 160's over 70's.  Feels sluggish.  States he wants Dr. Clent Ridges to "come up with something else."  Per protocol, emergent symptoms denied; advised appt within 72 hours.  Patient refuses appt, as he has been in office x 2 for this.  Adamant about getting a call from Dr. Clent Ridges to discuss this over the phone.  Info to office for provider review/callback to patient.  May reach patient at [phone].

## 2012-09-28 MED ORDER — LOSARTAN POTASSIUM 100 MG PO TABS: 100.0000 mg | ORAL_TABLET | Freq: Every day | ORAL | Status: DC

## 2012-09-28 NOTE — Telephone Encounter (Signed)
Stop the Metoprolol. He seemed to tolerate Losartan well on the 50 mg dose, so get back on this but increase the dose to 100 mg daily. Call in #30 with 2 rf

## 2012-09-28 NOTE — Telephone Encounter (Signed)
I sent script e-scribe and left a voice message with the below information.

## 2012-12-20 ENCOUNTER — Other Ambulatory Visit: Payer: Self-pay | Admitting: Family Medicine

## 2012-12-29 ENCOUNTER — Other Ambulatory Visit: Payer: Self-pay | Admitting: Family Medicine

## 2013-02-28 ENCOUNTER — Telehealth: Payer: Self-pay | Admitting: Family Medicine

## 2013-02-28 MED ORDER — LEVOTHYROXINE SODIUM 150 MCG PO TABS: 150.0000 ug | ORAL_TABLET | Freq: Every day | ORAL | Status: DC

## 2013-02-28 NOTE — Telephone Encounter (Signed)
Refill request for Synthroid 150 mcg and a 90 day supply, which I did send e-scribe.

## 2013-04-27 ENCOUNTER — Other Ambulatory Visit: Payer: Self-pay | Admitting: Family Medicine

## 2013-04-28 NOTE — Telephone Encounter (Signed)
Ok to refill x 1   ( 10#) Dr Clent Ridges to do further refills

## 2013-07-04 ENCOUNTER — Other Ambulatory Visit (INDEPENDENT_AMBULATORY_CARE_PROVIDER_SITE_OTHER): Payer: 59

## 2013-07-04 DIAGNOSIS — Z0000 Encounter for general adult medical examination without abnormal findings: Secondary | ICD-10-CM

## 2013-07-04 LAB — LIPID PANEL
Cholesterol: 233 mg/dL — ABNORMAL HIGH (ref 0–200)
HDL: 42.1 mg/dL (ref >39.00)
Triglycerides: 163 mg/dL — ABNORMAL HIGH (ref 0.0–149.0)
VLDL: 32.6 mg/dL (ref 0.0–40.0)

## 2013-07-04 LAB — POCT URINALYSIS DIPSTICK
Blood, UA: NEGATIVE
Protein, UA: NEGATIVE
Spec Grav, UA: 1.02
Urobilinogen, UA: 0.2

## 2013-07-04 LAB — HEPATIC FUNCTION PANEL
AST: 30 U/L (ref 0–37)
Albumin: 4.1 g/dL (ref 3.5–5.2)
Alkaline Phosphatase: 67 U/L (ref 39–117)
Total Protein: 6.9 g/dL (ref 6.0–8.3)

## 2013-07-04 LAB — BASIC METABOLIC PANEL
GFR: 82.66 mL/min (ref >60.00)
Glucose, Bld: 93 mg/dL (ref 70–99)
Potassium: 5.1 mEq/L (ref 3.5–5.1)
Sodium: 138 mEq/L (ref 135–145)

## 2013-07-04 LAB — CBC WITH DIFFERENTIAL/PLATELET
Eosinophils Absolute: 0.1 10*3/uL (ref 0.0–0.7)
Eosinophils Relative: 1.8 % (ref 0.0–5.0)
MCV: 83.3 fl (ref 78.0–100.0)
Monocytes Absolute: 0.6 10*3/uL (ref 0.1–1.0)
Neutrophils Relative %: 60.1 % (ref 43.0–77.0)
Platelets: 259 10*3/uL (ref 150.0–400.0)
WBC: 6.6 10*3/uL (ref 4.5–10.5)

## 2013-07-05 ENCOUNTER — Other Ambulatory Visit: Payer: 59

## 2013-07-06 NOTE — Progress Notes (Signed)
Quick Note:  I spoke with pt, he will discuss this further with Dr. Clent Ridges on 07/11/13 when he comes in for his CPE. ______

## 2013-07-11 ENCOUNTER — Encounter: Payer: Self-pay | Admitting: Family Medicine

## 2013-07-11 ENCOUNTER — Ambulatory Visit (INDEPENDENT_AMBULATORY_CARE_PROVIDER_SITE_OTHER): Payer: 59 | Admitting: Family Medicine

## 2013-07-11 VITALS — BP 140/80 | HR 73 | Temp 97.8°F | Ht 69.5 in | Wt 232.0 lb

## 2013-07-11 DIAGNOSIS — R972 Elevated prostate specific antigen [PSA]: Secondary | ICD-10-CM

## 2013-07-11 DIAGNOSIS — Z0000 Encounter for general adult medical examination without abnormal findings: Secondary | ICD-10-CM

## 2013-07-11 MED ORDER — SILDENAFIL CITRATE 50 MG PO TABS: 25.0000 mg | ORAL_TABLET | Freq: Every day | ORAL | Status: DC | PRN

## 2013-07-11 MED ORDER — NAFTIFINE HCL 1 % EX CREA: TOPICAL_CREAM | CUTANEOUS | Status: DC | PRN

## 2013-07-11 MED ORDER — LEVOTHYROXINE SODIUM 150 MCG PO TABS: 150.0000 ug | ORAL_TABLET | Freq: Every day | ORAL | Status: DC

## 2013-07-11 MED ORDER — MOMETASONE FUROATE 0.1 % EX CREA: TOPICAL_CREAM | CUTANEOUS | Status: DC | PRN

## 2013-07-11 MED ORDER — ACYCLOVIR 200 MG PO CAPS: 200.0000 mg | ORAL_CAPSULE | ORAL | Status: DC | PRN

## 2013-07-11 MED ORDER — LOSARTAN POTASSIUM-HCTZ 100-25 MG PO TABS: 1.0000 | ORAL_TABLET | Freq: Every day | ORAL | Status: DC

## 2013-07-11 NOTE — Progress Notes (Signed)
  Subjective:    Patient ID: Luke Skinner, male    DOB: [birth year], 57 y.o.   MRN: [id number]  HPI 57 yr old male for a cpx. He feels well and has no complaints. He does note that he has gained 15 lbs in the past year. His BP has gone up slightly along with the weight gain.    Review of Systems  Constitutional: Negative.   HENT: Negative.   Eyes: Negative.   Respiratory: Negative.   Cardiovascular: Negative.   Gastrointestinal: Negative.   Genitourinary: Negative.   Musculoskeletal: Negative.   Skin: Negative.   Neurological: Negative.   Psychiatric/Behavioral: Negative.        Objective:   Physical Exam  Constitutional: He is oriented to person, place, and time. He appears well-developed and well-nourished. No distress.  HENT:  Head: Normocephalic and atraumatic.  Right Ear: External ear normal.  Left Ear: External ear normal.  Nose: Nose normal.  Mouth/Throat: Oropharynx is clear and moist. No oropharyngeal exudate.  Eyes: Conjunctivae and EOM are normal. Pupils are equal, round, and reactive to light. Right eye exhibits no discharge. Left eye exhibits no discharge. No scleral icterus.  Neck: Neck supple. No JVD present. No tracheal deviation present. No thyromegaly present.  Cardiovascular: Normal rate, regular rhythm, normal heart sounds and intact distal pulses.  Exam reveals no gallop and no friction rub.   No murmur heard. EKG normal   Pulmonary/Chest: Effort normal and breath sounds normal. No respiratory distress. He has no wheezes. He has no rales. He exhibits no tenderness.  Abdominal: Soft. Bowel sounds are normal. He exhibits no distension and no mass. There is no tenderness. There is no rebound and no guarding.  Genitourinary: Rectum normal, prostate normal and penis normal. Guaiac negative stool. No penile tenderness.  Musculoskeletal: Normal range of motion. He exhibits no edema and no tenderness.  Lymphadenopathy:    He has no cervical adenopathy.   Neurological: He is alert and oriented to person, place, and time. He has normal reflexes. No cranial nerve deficit. He exhibits normal muscle tone. Coordination normal.  Skin: Skin is warm and dry. No rash noted. He is not diaphoretic. No erythema. No pallor.  Psychiatric: He has a normal mood and affect. His behavior is normal. Judgment and thought content normal.          Assessment & Plan:  Well exam . He will change his diet and lose some weight. Add HCTZ to his Losartan. We will refer to Urology for the increased PSA.

## 2013-10-10 ENCOUNTER — Telehealth: Payer: Self-pay | Admitting: Family Medicine

## 2013-10-10 NOTE — Telephone Encounter (Signed)
Patient Information:  Caller Name: Yousef  Phone: [phone]  Patient: Luke Skinner, Luke Skinner  Gender: Male  DOB: 1956/07/28  Age: 57 Years  PCP: Gershon Crane [facility])  Office Follow Up:  Does the office need to follow up with this patient?: No  Instructions For The Office: N/A   Symptoms  Reason For Call & Symptoms: c/o head pressure; discharge is going down his throat and causing a cough; having green nasal drainage; coughing up yellowish or very light brown phlegm; hx of pneumonia  Reviewed Health History In EMR: Yes  Reviewed Medications In EMR: Yes  Reviewed Allergies In EMR: Yes  Reviewed Surgeries / Procedures: Yes  Date of Onset of Symptoms: Unknown  Treatments Tried: Sudafed, Mucinex  Treatments Tried Worked: No  Guideline(s) Used:  Cough  Disposition Per Guideline:   Home Care  Reason For Disposition Reached:   Cough with cold symptoms (e.g., runny nose, postnasal drip, throat clearing)  Advice Given:  Cough Medicines:  OTC Cough Syrups: The most common cough suppressant in OTC cough medications is dextromethorphan. Often the letters "DM" appear in the name.  OTC Cough Drops: Cough drops can help a lot, especially for mild coughs. They reduce coughing by soothing your irritated throat and removing that tickle sensation in the back of the throat. Cough drops also have the advantage of portability - you can carry them with you.  Home Remedy - Honey: This old home remedy has been shown to help decrease coughing at night. The adult dosage is 2 teaspoons (10 ml) at bedtime. Honey should not be given to infants under one year of age.  OTC Cough Syrup - Dextromethorphan:  Cough syrups containing the cough suppressant dextromethorphan (DM) may help decrease your cough. Cough syrups work best for coughs that keep you awake at night. They can also sometimes help in the late stages of a respiratory infection when the cough is dry and hacking. They can be used along with  cough drops.  Examples: Benylin, Robitussin DM, Vicks 44 Cough Relief  Coughing Spasms:  Drink warm fluids. Inhale warm mist (Reason: both relax the airway and loosen up the phlegm).  Suck on cough drops or hard candy to coat the irritated throat.  Prevent Dehydration:  Drink adequate liquids.  This will help soothe an irritated or dry throat and loosen up the phlegm.  Avoid Tobacco Smoke:  Smoking or being exposed to smoke makes coughs much worse.  Fever Medicines:  Acetaminophen (e.g., Tylenol):  Regular Strength Tylenol: Take 650 mg (two 325 mg pills) by mouth every 4-6 hours as needed. Each Regular Strength Tylenol pill has 325 mg of acetaminophen.  Expected Course:   The expected course depends on what is causing the cough.  Viral bronchitis (chest cold) causes a cough that lasts 1 to 3 weeks. Sometimes you may cough up lots of phlegm (sputum, mucus). The mucus can normally be white, gray, yellow, or green.  Call Back If:  Cough lasts more than 3 weeks  You become worse.  Patient Will Follow Care Advice:  YES

## 2013-10-19 DIAGNOSIS — Z87442 Personal history of urinary calculi: Secondary | ICD-10-CM

## 2013-10-19 HISTORY — DX: Personal history of urinary calculi: Z87.442

## 2013-12-06 ENCOUNTER — Telehealth: Payer: Self-pay | Admitting: Family Medicine

## 2013-12-06 NOTE — Telephone Encounter (Signed)
Luke Skinner calling because his BP med was changed to Losartan-HCTZ in September 2014.  States his BP is much better, 116/61 today.  However, continuing to have decreased energy and libido as previously discussed in the office.  Wants to know if his medication could be changed to only a diuretic.  Please f/u with pt at [phone].

## 2013-12-07 NOTE — Telephone Encounter (Signed)
I think that may be too drastic a change all at once. Tell him to cut the Losartan HCT tablet in half and take 1/2 tab daily. Check back with [place] in 2 weeks

## 2013-12-07 NOTE — Telephone Encounter (Signed)
I spoke with pt and went over the below information.

## 2014-01-11 ENCOUNTER — Telehealth: Payer: Self-pay | Admitting: Family Medicine

## 2014-01-11 NOTE — Telephone Encounter (Signed)
I spoke with pt and he is due for a TDAP.

## 2014-01-11 NOTE — Telephone Encounter (Signed)
Pt will have a new baby in the family and wuold like to know if he is current on TDAP? pls advise

## 2014-01-12 ENCOUNTER — Ambulatory Visit (INDEPENDENT_AMBULATORY_CARE_PROVIDER_SITE_OTHER): Payer: 59 | Admitting: Family Medicine

## 2014-01-12 DIAGNOSIS — Z23 Encounter for immunization: Secondary | ICD-10-CM

## 2014-05-31 ENCOUNTER — Encounter: Payer: Self-pay | Admitting: Internal Medicine

## 2014-07-10 ENCOUNTER — Other Ambulatory Visit (INDEPENDENT_AMBULATORY_CARE_PROVIDER_SITE_OTHER): Payer: 59

## 2014-07-10 DIAGNOSIS — Z0000 Encounter for general adult medical examination without abnormal findings: Secondary | ICD-10-CM

## 2014-07-10 LAB — CBC WITH DIFFERENTIAL/PLATELET
BASOS PCT: 0.5 % (ref 0.0–3.0)
Basophils Absolute: 0 10*3/uL (ref 0.0–0.1)
Eosinophils Absolute: 0.1 10*3/uL (ref 0.0–0.7)
Eosinophils Relative: 2 % (ref 0.0–5.0)
HCT: 41.7 % (ref 39.0–52.0)
HEMOGLOBIN: 13.9 g/dL (ref 13.0–17.0)
LYMPHS ABS: 1.9 10*3/uL (ref 0.7–4.0)
Lymphocytes Relative: 29.6 % (ref 12.0–46.0)
MCHC: 33.3 g/dL (ref 30.0–36.0)
MCV: 84.9 fl (ref 78.0–100.0)
MONOS PCT: 10.1 % (ref 3.0–12.0)
Monocytes Absolute: 0.7 10*3/uL (ref 0.1–1.0)
Neutro Abs: 3.7 10*3/uL (ref 1.4–7.7)
Neutrophils Relative %: 57.8 % (ref 43.0–77.0)
PLATELETS: 262 10*3/uL (ref 150.0–400.0)
RBC: 4.92 Mil/uL (ref 4.22–5.81)
RDW: 13.6 % (ref 11.5–15.5)
WBC: 6.5 10*3/uL (ref 4.0–10.5)

## 2014-07-10 LAB — PSA: PSA: 1.24 ng/mL (ref 0.10–4.00)

## 2014-07-10 LAB — POCT URINALYSIS DIPSTICK
Bilirubin, UA: NEGATIVE
Blood, UA: NEGATIVE
Glucose, UA: NEGATIVE
Ketones, UA: NEGATIVE
Leukocytes, UA: NEGATIVE
Nitrite, UA: NEGATIVE
PH UA: 6
PROTEIN UA: NEGATIVE
SPEC GRAV UA: 1.02
UROBILINOGEN UA: 0.2

## 2014-07-10 LAB — LIPID PANEL
Cholesterol: 235 mg/dL — ABNORMAL HIGH (ref 0–200)
HDL: 42.3 mg/dL (ref >39.00)
LDL Cholesterol: 171 mg/dL — ABNORMAL HIGH (ref 0–99)
NonHDL: 192.7
Total CHOL/HDL Ratio: 6
Triglycerides: 107 mg/dL (ref 0.0–149.0)
VLDL: 21.4 mg/dL (ref 0.0–40.0)

## 2014-07-10 LAB — HEPATIC FUNCTION PANEL
ALBUMIN: 4.1 g/dL (ref 3.5–5.2)
ALT: 46 U/L (ref 0–53)
AST: 28 U/L (ref 0–37)
Alkaline Phosphatase: 69 U/L (ref 39–117)
Bilirubin, Direct: 0.1 mg/dL (ref 0.0–0.3)
TOTAL PROTEIN: 7.3 g/dL (ref 6.0–8.3)
Total Bilirubin: 0.7 mg/dL (ref 0.2–1.2)

## 2014-07-10 LAB — BASIC METABOLIC PANEL
BUN: 19 mg/dL (ref 6–23)
CHLORIDE: 104 meq/L (ref 96–112)
CO2: 27 mEq/L (ref 19–32)
Calcium: 9.4 mg/dL (ref 8.4–10.5)
Creatinine, Ser: 0.9 mg/dL (ref 0.4–1.5)
GFR: 87.44 mL/min (ref >60.00)
Glucose, Bld: 99 mg/dL (ref 70–99)
POTASSIUM: 4.5 meq/L (ref 3.5–5.1)
SODIUM: 139 meq/L (ref 135–145)

## 2014-07-10 LAB — TSH: TSH: 2.62 u[IU]/mL (ref 0.35–4.50)

## 2014-07-17 ENCOUNTER — Encounter: Payer: Self-pay | Admitting: Family Medicine

## 2014-07-17 ENCOUNTER — Ambulatory Visit (INDEPENDENT_AMBULATORY_CARE_PROVIDER_SITE_OTHER): Payer: 59 | Admitting: Family Medicine

## 2014-07-17 VITALS — BP 140/67 | HR 69 | Temp 98.2°F | Ht 69.5 in | Wt 242.0 lb

## 2014-07-17 DIAGNOSIS — Z0000 Encounter for general adult medical examination without abnormal findings: Secondary | ICD-10-CM

## 2014-07-17 MED ORDER — MOMETASONE FUROATE 0.1 % EX CREA: TOPICAL_CREAM | CUTANEOUS | Status: DC | PRN

## 2014-07-17 MED ORDER — NAFTIFINE HCL 1 % EX CREA: TOPICAL_CREAM | CUTANEOUS | Status: DC | PRN

## 2014-07-17 MED ORDER — LOSARTAN POTASSIUM-HCTZ 100-25 MG PO TABS: 1.0000 | ORAL_TABLET | Freq: Every day | ORAL | Status: DC

## 2014-07-17 MED ORDER — SILDENAFIL CITRATE 50 MG PO TABS: 25.0000 mg | ORAL_TABLET | Freq: Every day | ORAL | Status: DC | PRN

## 2014-07-17 MED ORDER — LEVOTHYROXINE SODIUM 150 MCG PO TABS: 150.0000 ug | ORAL_TABLET | Freq: Every day | ORAL | Status: DC

## 2014-07-17 MED ORDER — EZETIMIBE 10 MG PO TABS: 10.0000 mg | ORAL_TABLET | Freq: Every day | ORAL | Status: DC

## 2014-07-17 MED ORDER — ACYCLOVIR 200 MG PO CAPS: 200.0000 mg | ORAL_CAPSULE | ORAL | Status: DC | PRN

## 2014-07-17 NOTE — Progress Notes (Signed)
   Subjective:    Patient ID: Luke Skinner, male    DOB: July 10, 1956, 58 y.o.   MRN: [id number]  HPI 58 yr old male for a cpx. He feels well. He exercises and watches his diet but his lipid levels remain high. He does not tolerate statins.    Review of Systems  Constitutional: Negative.   HENT: Negative.   Eyes: Negative.   Respiratory: Negative.   Cardiovascular: Negative.   Gastrointestinal: Negative.   Genitourinary: Negative.   Musculoskeletal: Negative.   Skin: Negative.   Neurological: Negative.   Psychiatric/Behavioral: Negative.        Objective:   Physical Exam  Constitutional: He is oriented to person, place, and time. He appears well-developed and well-nourished. No distress.  HENT:  Head: Normocephalic and atraumatic.  Right Ear: External ear normal.  Left Ear: External ear normal.  Nose: Nose normal.  Mouth/Throat: Oropharynx is clear and moist. No oropharyngeal exudate.  Eyes: Conjunctivae and EOM are normal. Pupils are equal, round, and reactive to light. Right eye exhibits no discharge. Left eye exhibits no discharge. No scleral icterus.  Neck: Neck supple. No JVD present. No tracheal deviation present. No thyromegaly present.  Cardiovascular: Normal rate, regular rhythm, normal heart sounds and intact distal pulses.  Exam reveals no gallop and no friction rub.   No murmur heard. EKG normal   Pulmonary/Chest: Effort normal and breath sounds normal. No respiratory distress. He has no wheezes. He has no rales. He exhibits no tenderness.  Abdominal: Soft. Bowel sounds are normal. He exhibits no distension and no mass. There is no tenderness. There is no rebound and no guarding.  Genitourinary: Rectum normal, prostate normal and penis normal. Guaiac negative stool. No penile tenderness.  Musculoskeletal: Normal range of motion. He exhibits no edema and no tenderness.  Lymphadenopathy:    He has no cervical adenopathy.  Neurological: He is alert and oriented to  person, place, and time. He has normal reflexes. No cranial nerve deficit. He exhibits normal muscle tone. Coordination normal.  Skin: Skin is warm and dry. No rash noted. He is not diaphoretic. No erythema. No pallor.  Psychiatric: He has a normal mood and affect. His behavior is normal. Judgment and thought content normal.          Assessment & Plan:  Well exam. Add Zetia 10 mg daily. Recheck lipids in 90 days

## 2014-07-17 NOTE — Progress Notes (Signed)
Pre visit review using our clinic review tool, if applicable. No additional management support is needed unless otherwise documented below in the visit note. 

## 2014-10-05 ENCOUNTER — Emergency Department (HOSPITAL_COMMUNITY): Payer: 59

## 2014-10-05 ENCOUNTER — Emergency Department (HOSPITAL_COMMUNITY)
Admission: EM | Admit: 2014-10-05 | Discharge: 2014-10-05 | Disposition: A | Discharge status: Home / Self Care | Payer: 59 | Attending: Emergency Medicine | Admitting: Emergency Medicine

## 2014-10-05 ENCOUNTER — Encounter (HOSPITAL_COMMUNITY): Payer: Self-pay | Admitting: *Deleted

## 2014-10-05 ENCOUNTER — Telehealth: Payer: Self-pay

## 2014-10-05 DIAGNOSIS — E039 Hypothyroidism, unspecified: Secondary | ICD-10-CM | POA: Insufficient documentation

## 2014-10-05 DIAGNOSIS — Z87891 Personal history of nicotine dependence: Secondary | ICD-10-CM | POA: Diagnosis not present

## 2014-10-05 DIAGNOSIS — N200 Calculus of kidney: Secondary | ICD-10-CM

## 2014-10-05 DIAGNOSIS — F329 Major depressive disorder, single episode, unspecified: Secondary | ICD-10-CM | POA: Diagnosis not present

## 2014-10-05 DIAGNOSIS — Z791 Long term (current) use of non-steroidal anti-inflammatories (NSAID): Secondary | ICD-10-CM | POA: Insufficient documentation

## 2014-10-05 DIAGNOSIS — M1990 Unspecified osteoarthritis, unspecified site: Secondary | ICD-10-CM | POA: Insufficient documentation

## 2014-10-05 DIAGNOSIS — Z8619 Personal history of other infectious and parasitic diseases: Secondary | ICD-10-CM | POA: Diagnosis not present

## 2014-10-05 DIAGNOSIS — R109 Unspecified abdominal pain: Secondary | ICD-10-CM

## 2014-10-05 DIAGNOSIS — I10 Essential (primary) hypertension: Secondary | ICD-10-CM | POA: Insufficient documentation

## 2014-10-05 DIAGNOSIS — Z79899 Other long term (current) drug therapy: Secondary | ICD-10-CM | POA: Diagnosis not present

## 2014-10-05 LAB — CBC WITH DIFFERENTIAL/PLATELET
BASOS ABS: 0 10*3/uL (ref 0.0–0.1)
BASOS PCT: 0 % (ref 0–1)
EOS ABS: 0.1 10*3/uL (ref 0.0–0.7)
Eosinophils Relative: 1 % (ref 0–5)
HCT: 43.1 % (ref 39.0–52.0)
Hemoglobin: 13.7 g/dL (ref 13.0–17.0)
LYMPHS ABS: 1.6 10*3/uL (ref 0.7–4.0)
Lymphocytes Relative: 15 % (ref 12–46)
MCH: 27.7 pg (ref 26.0–34.0)
MCHC: 31.8 g/dL (ref 30.0–36.0)
MCV: 87.2 fL (ref 78.0–100.0)
Monocytes Absolute: 0.6 10*3/uL (ref 0.1–1.0)
Monocytes Relative: 6 % (ref 3–12)
NEUTROS PCT: 78 % — AB (ref 43–77)
Neutro Abs: 7.8 10*3/uL — ABNORMAL HIGH (ref 1.7–7.7)
Platelets: 259 10*3/uL (ref 150–400)
RBC: 4.94 MIL/uL (ref 4.22–5.81)
RDW: 13.3 % (ref 11.5–15.5)
WBC: 10.1 10*3/uL (ref 4.0–10.5)

## 2014-10-05 LAB — COMPREHENSIVE METABOLIC PANEL
ALBUMIN: 4.2 g/dL (ref 3.5–5.2)
ALK PHOS: 76 U/L (ref 39–117)
ALT: 57 U/L — AB (ref 0–53)
AST: 33 U/L (ref 0–37)
Anion gap: 14 (ref 5–15)
BUN: 17 mg/dL (ref 6–23)
CALCIUM: 10.1 mg/dL (ref 8.4–10.5)
CO2: 25 mEq/L (ref 19–32)
Chloride: 100 mEq/L (ref 96–112)
Creatinine, Ser: 1.16 mg/dL (ref 0.50–1.35)
GFR calc Af Amer: 78 mL/min — ABNORMAL LOW (ref >90)
GFR calc non Af Amer: 68 mL/min — ABNORMAL LOW (ref >90)
Glucose, Bld: 140 mg/dL — ABNORMAL HIGH (ref 70–99)
POTASSIUM: 4.3 meq/L (ref 3.7–5.3)
SODIUM: 139 meq/L (ref 137–147)
TOTAL PROTEIN: 7.3 g/dL (ref 6.0–8.3)
Total Bilirubin: 0.5 mg/dL (ref 0.3–1.2)

## 2014-10-05 LAB — URINALYSIS, ROUTINE W REFLEX MICROSCOPIC
BILIRUBIN URINE: NEGATIVE
GLUCOSE, UA: NEGATIVE mg/dL
HGB URINE DIPSTICK: NEGATIVE
Ketones, ur: NEGATIVE mg/dL
Leukocytes, UA: NEGATIVE
Nitrite: NEGATIVE
PROTEIN: NEGATIVE mg/dL
Specific Gravity, Urine: 1.025 (ref 1.005–1.030)
Urobilinogen, UA: 0.2 mg/dL (ref 0.0–1.0)
pH: 6 (ref 5.0–8.0)

## 2014-10-05 LAB — LIPASE, BLOOD: Lipase: 28 U/L (ref 11–59)

## 2014-10-05 MED ORDER — SODIUM CHLORIDE 0.9 % IV BOLUS (SEPSIS)
1000.0000 mL | Freq: Once | INTRAVENOUS | Status: AC
  Administered 2014-10-05: 1000 mL via INTRAVENOUS

## 2014-10-05 MED ORDER — KETOROLAC TROMETHAMINE 30 MG/ML IJ SOLN
30.0000 mg | Freq: Once | INTRAMUSCULAR | Status: AC
  Administered 2014-10-05: 30 mg via INTRAVENOUS
  Filled 2014-10-05: qty 1

## 2014-10-05 MED ORDER — HYDROMORPHONE HCL 1 MG/ML IJ SOLN
1.0000 mg | Freq: Once | INTRAMUSCULAR | Status: AC
  Administered 2014-10-05: 1 mg via INTRAVENOUS
  Filled 2014-10-05: qty 1

## 2014-10-05 MED ORDER — HYDROCODONE-ACETAMINOPHEN 5-325 MG PO TABS: 1.0000 | ORAL_TABLET | Freq: Four times a day (QID) | ORAL | Status: DC | PRN

## 2014-10-05 MED ORDER — FENTANYL CITRATE 0.05 MG/ML IJ SOLN
50.0000 ug | Freq: Once | INTRAMUSCULAR | Status: AC
  Administered 2014-10-05: 50 ug via INTRAVENOUS
  Filled 2014-10-05: qty 2

## 2014-10-05 MED ORDER — HYDROCODONE-ACETAMINOPHEN 5-325 MG PO TABS
2.0000 | ORAL_TABLET | Freq: Once | ORAL | Status: AC
  Administered 2014-10-05: 2 via ORAL
  Filled 2014-10-05: qty 2

## 2014-10-05 MED ORDER — ONDANSETRON HCL 4 MG/2ML IJ SOLN
4.0000 mg | Freq: Once | INTRAMUSCULAR | Status: AC
  Administered 2014-10-05: 4 mg via INTRAVENOUS
  Filled 2014-10-05: qty 2

## 2014-10-05 NOTE — ED Provider Notes (Signed)
CSN: [id number]     Arrival date & time 10/05/14  6629 History   First MD Initiated Contact with Patient 10/05/14 1009     Chief Complaint  Patient presents with  . Flank Pain     (Consider location/radiation/quality/duration/timing/severity/associated sxs/prior Treatment) HPI Comments: Radiates to R central abdomen  Patient is a 58 y.o. male presenting with flank pain. The history is provided by the patient.  Flank Pain This is a new problem. The current episode started 3 to 5 hours ago. The problem occurs constantly. The problem has been gradually worsening. Associated symptoms include abdominal pain. Pertinent negatives include no chest pain and no shortness of breath. Nothing aggravates the symptoms. Nothing relieves the symptoms. He has tried nothing for the symptoms.    Past Medical History  Diagnosis Date  . Hyperlipidemia   . Allergy   . Thyroid disease     hypothyroidism  . Arthritis   . Herpes stomatitis   . Depression     per Dr. Caprice Beaver  . Hypertension    Past Surgical History  Procedure Laterality Date  . Colonoscopy  12-09-10    per Dr. Henrene Pastor, repeat in 10 yrs  . Total knee arthroplasty  2008    on left per Dr. Wynelle Link  . Hernia repair      left inguinal   . Nasal septum surgery    . Shoulder arthroscopy  2005    on right per Dr. Wynelle Link  . Esophagus surgery      as infant   . Gastrostomy  1989    per Dr. Annamaria Boots   Family History  Problem Relation Age of Onset  . Heart disease Mother   . Thyroid disease Mother   . Parkinsonism Father    History  Substance Use Topics  . Smoking status: Former Smoker    Types: Cigarettes    Quit date: 10/19/1998  . Smokeless tobacco: Never Used  . Alcohol Use: No    Review of Systems  Constitutional: Negative for fever.  Respiratory: Negative for cough and shortness of breath.   Cardiovascular: Negative for chest pain.  Gastrointestinal: Positive for abdominal pain.  Genitourinary: Positive for flank pain.   All other systems reviewed and are negative.     Allergies  Simvastatin and Sulfamethoxazole  Home Medications   Prior to Admission medications   Medication Sig Start Date End Date Taking? Authorizing Provider  acetaminophen (TYLENOL) 325 MG tablet Take 325 mg by mouth as needed.     Historical Provider, MD  acyclovir (ZOVIRAX) 200 MG capsule Take 1 capsule (200 mg total) by mouth as needed. Fever blisters 07/17/14   Laurey Morale, MD  amoxicillin (AMOXIL) 500 MG capsule Take 500 mg by mouth as needed. Take prior to dental visit    Historical Provider, MD  Ascorbic Acid (VITAMIN C) 1000 MG tablet Take 1,000 mg by mouth daily.      Historical Provider, MD  B Complex Vitamins (VITAMIN B COMPLEX PO) Take 1 each by mouth daily.      Historical Provider, MD  Calcium-Vitamin D 600-200 MG-UNIT per tablet Take 1 tablet by mouth daily.      Historical Provider, MD  diphenhydrAMINE (BENADRYL) 25 MG tablet Take 25 mg by mouth at bedtime. For sleep.     Historical Provider, MD  ezetimibe (ZETIA) 10 MG tablet Take 1 tablet (10 mg total) by mouth daily. 07/17/14   Laurey Morale, MD  Flaxseed, Linseed, 1000 MG CAPS Take 2,000 each by  mouth daily.      Historical Provider, MD  levothyroxine (SYNTHROID, LEVOTHROID) 150 MCG tablet Take 1 tablet (150 mcg total) by mouth daily. 07/17/14   Laurey Morale, MD  losartan-hydrochlorothiazide (HYZAAR) 100-25 MG per tablet Take 1 tablet by mouth daily. 07/17/14   Laurey Morale, MD  mometasone (ELOCON) 0.1 % cream Apply topically as needed. 07/17/14   Laurey Morale, MD  Multiple Vitamins-Minerals (ONE-A-DAY MENS 50+ ADVANTAGE PO) Take 1 each by mouth daily.      Historical Provider, MD  naftifine (NAFTIN) 1 % cream Apply topically as needed. 07/17/14   Laurey Morale, MD  naproxen sodium (ANAPROX) 220 MG tablet Take 220 mg by mouth 2 (two) times daily with a meal. For arthritis    Historical Provider, MD  Sennosides-Docusate Sodium (STOOL SOFTENER LAXATIVE PO) Take by  mouth. 3 times weekly     Historical Provider, MD  sildenafil (VIAGRA) 50 MG tablet Take 0.5 tablets (25 mg total) by mouth daily as needed. 07/17/14   Laurey Morale, MD   BP 196/84 mmHg  Pulse 62  Temp(Src) 97.4 F (36.3 C) (Oral)  Resp 16  SpO2 97% Physical Exam  Constitutional: He is oriented to person, place, and time. He appears well-developed and well-nourished. He appears distressed.  HENT:  Head: Normocephalic and atraumatic.  Mouth/Throat: Oropharynx is clear and moist. No oropharyngeal exudate.  Eyes: EOM are normal. Pupils are equal, round, and reactive to light.  Neck: Normal range of motion. Neck supple.  Cardiovascular: Normal rate and regular rhythm.  Exam reveals no friction rub.   No murmur heard. Pulmonary/Chest: Effort normal and breath sounds normal. No respiratory distress. He has no wheezes. He has no rales.  Abdominal: Soft. He exhibits no distension. There is no tenderness. There is no rebound.  Musculoskeletal: Normal range of motion. He exhibits no edema.  Neurological: He is alert and oriented to person, place, and time. No cranial nerve deficit. He exhibits normal muscle tone. Coordination normal.  Skin: No rash noted. He is not diaphoretic.  Nursing note and vitals reviewed.   ED Course  Procedures (including critical care time) Labs Review Labs Reviewed  CBC WITH DIFFERENTIAL  COMPREHENSIVE METABOLIC PANEL  LIPASE, BLOOD  URINALYSIS, ROUTINE W REFLEX MICROSCOPIC    Imaging Review Ct Abdomen Pelvis Wo Contrast  10/05/2014   CLINICAL DATA:  Right flank pain  EXAM: CT ABDOMEN AND PELVIS WITHOUT CONTRAST  TECHNIQUE: Multidetector CT imaging of the abdomen and pelvis was performed following the standard protocol without IV contrast.  COMPARISON:  None.  FINDINGS: Lung bases are unremarkable. Degenerative changes are noted thoracic and lumbar spine. Significant disc space flattening multilevel lower lumbar spine.  Atherosclerotic calcifications of  abdominal aorta and iliac arteries. No aortic aneurysm.  There is fatty infiltration of the liver. No calcified gallstones are noted within gallbladder.  Unenhanced pancreas, spleen and adrenal glands are unremarkable. There is mild right hydronephrosis and right hydroureter. Mild right perinephric and periureteral stranding. Nonobstructive calcified calculus midpole posterior aspect of the right kidney measures 5.4 mm.  There is infraumbilical region hernia containing fat without evidence of acute complication measures 2.6 x 2.5 cm.  Nonobstructive calcified calculus midpole of the left kidney measures 1 mm. Tiny cyst midpole of the left kidney posterior aspect measures 9 mm. No proximal or mid ureteral calculi are noted bilaterally.  In axial image 86 there is calcified obstructive calculus in right UVJ/urinary bladder wall measures about 3 mm.  No small  bowel obstruction. No pericecal inflammation. Normal retrocecal appendix partially visualized axial image 63. Pelvic phleboliths are noted. Distal left ureter is unremarkable. Prostate gland and seminal vesicles are unremarkable. No ascites or free air.  IMPRESSION: 1. There is bilateral nonobstructive nephrolithiasis. Mild right hydronephrosis and right hydroureter. 2. There is 3 mm calcified calculus in right UVJ/urinary bladder wall. 3. Normal retrocecal appendix. 4. Small infraumbilical hernia containing fat measures 2.5 x 2.6 cm without evidence of acute complication. 5. No small bowel obstruction. 6. Degenerative changes thoracolumbar spine.   Electronically Signed   By: Lahoma Crocker M.D.   On: 10/05/2014 11:33     EKG Interpretation None      MDM   Final diagnoses:  Right flank pain  Right kidney stone    38M here with acute onset of R flank pain. Never had this pain before. R flank, radiates to R side of his abdomen. Associated nausea, no vomiting. On exam, no appreciable tenderness, no CVA tenderness. Patient extremely uncomfortable, concern  for kidney stone. Will give pain meds, scan his abdomen.  Abdominal scan shows R UVJ stone. Feeling better with pain meds. Stable for discharge.  Evelina Bucy, MD 10/05/14 [id number]

## 2014-10-05 NOTE — ED Notes (Addendum)
Pt reports sudden onset of right flank pain this morning, at time pain radiates around to abdomen. Pain 9/10. Nausea, denies vomiting. No hx of kidney stones in past.

## 2014-10-05 NOTE — ED Notes (Signed)
Patient transported to CT 

## 2014-10-05 NOTE — Telephone Encounter (Signed)
[facility] - Client [facility] Patient Name: Luke Skinner Gender: Male DOB: 1956-03-10 Age: 58 Y 9 M 10 D Return Phone Number: [phone] (Primary) Address: [address] City/State/Zip: [place] [address] Client [facility] - Client Client Site [place] - Day Physician [place], [place] Type Call Call Type Triage / Clinical Caller Name Pam Relationship To Patient Spouse Return Phone Number [phone] (Primary) Chief Complaint ABDOMINAL PAIN - Severe and only in abdomen Initial Comment Caller states husband is having severe right side abd pain. PreDisposition Go to ED Nurse Assessment Nurse: Mallie Mussel, RN, Alveta Heimlich Date/Time Eilene Ghazi Time): 10/05/2014 8:54:39 AM Confirm and document reason for call. If symptomatic, describe symptoms. ---Caller states that her husband has severe right sided abdominal pain beginning this morning. The pain is even with his belly button. The pain radiates around to his back. He feels the need to urinate now but cannot. He last urinated this morning. Denies blood in the urine. Has the patient traveled out of the country within the last 30 days? ---Yes Where have you traveled? ([place] for [place] and Ebola guideline, [place], [place] for [place]) ---[place], [place], [place] Does the patient require triage? ---Yes Related visit to physician within the last 2 weeks? ---No Does the PT have any chronic conditions? (i.e. diabetes, asthma, etc.) ---Yes List chronic conditions. ---Allergic to Sulfa, Hypothyroidism, HTN, Hypercholesterolemia Guidelines Guideline Title Affirmed Question Affirmed Notes Nurse Date/Time Eilene Ghazi Time) Flank Pain [1] SEVERE pain (e.g., excruciating, scale 8-10) AND [2] not improved after pain medicine Aleve, did not help Orlene Och 10/05/2014 8:57:54 AM Disp. Time Eilene Ghazi Time)  Disposition Final User 10/05/2014 8:53:25 AM Send to Urgent Queue Caryl Comes 10/05/2014 8:59:10 AM Go to ED Now Yes Mallie Mussel, RN, Carman Ching NOTE: All timestamps contained within this report are represented as [place] Standard Time. CONFIDENTIALTY NOTICE: This fax transmission is intended only for the addressee. It contains information that is legally privileged, confidential or otherwise protected from use or disclosure. If you are not the intended recipient, you are strictly prohibited from reviewing, disclosing, copying using or disseminating any of this information or taking any action in reliance on or regarding this information. If you have received this fax in error, please notify us immediately by telephone so that we can arrange for its return to [place]. Phone: [phone], Toll-Free: [phone], Fax: [phone] Page: 2 of 2 Call Id: [id number] Caller Understands: Yes Disagree/Comply: Comply Care Advice Given Per Guideline GO TO ED NOW: You need to be seen in the Emergency Department. Go to the ER at ___________ [place] now. Drive carefully. DRIVING: Another adult should drive. * Please bring a list of your current medicines when you go to the Emergency Department (ER). After Care Instructions Given Call Event Type User Date / Time Description Referrals [facility] - ED  Pt is currently admitted.

## 2014-10-05 NOTE — Discharge Instructions (Signed)

## 2014-10-16 ENCOUNTER — Telehealth: Payer: Self-pay | Admitting: Family Medicine

## 2014-10-16 MED ORDER — AZITHROMYCIN 250 MG PO TABS: ORAL_TABLET | ORAL | Status: DC

## 2014-10-16 NOTE — Telephone Encounter (Signed)
Rx sent.  Patient is aware and has scheduled an appointment.

## 2014-10-16 NOTE — Telephone Encounter (Signed)
We can call in a Zpack and he can use Mucinex OTC. We cannot give cough meds without an OV however

## 2014-10-16 NOTE — Telephone Encounter (Signed)
Pt would like to know if you could send him something for his cough and congestion as dr fry has done before.  Pt states head is stuffy, build up in his throat, runs down his throat and feels like throat is clogged.  No appt at [facility] today.  Scheduled pt for tomorrow, but if dr fry will call in rx, will cancel tomorrow.  [facility]

## 2014-10-17 ENCOUNTER — Ambulatory Visit (INDEPENDENT_AMBULATORY_CARE_PROVIDER_SITE_OTHER): Payer: 59 | Admitting: Family Medicine

## 2014-10-17 ENCOUNTER — Encounter: Payer: Self-pay | Admitting: Family Medicine

## 2014-10-17 VITALS — BP 126/63 | HR 70 | Temp 99.1°F | Ht 69.5 in | Wt 238.0 lb

## 2014-10-17 DIAGNOSIS — J0100 Acute maxillary sinusitis, unspecified: Secondary | ICD-10-CM

## 2014-10-17 MED ORDER — HYDROCODONE-HOMATROPINE 5-1.5 MG/5ML PO SYRP: 5.0000 mL | ORAL_SOLUTION | ORAL | Status: DC | PRN

## 2014-10-17 NOTE — Progress Notes (Signed)
Pre visit review using our clinic review tool, if applicable. No additional management support is needed unless otherwise documented below in the visit note. 

## 2014-10-17 NOTE — Progress Notes (Signed)
   Subjective:    Patient ID: Luke Skinner, male    DOB: [birth year], 58 y.o.   MRN: [id number]  HPI Here for one week of sinus pressure, PND, and coughing up green sputum. He started a Zpack yesterday evening.    Review of Systems  Constitutional: Negative.   HENT: Positive for congestion, postnasal drip and sinus pressure.   Eyes: Negative.   Respiratory: Positive for cough and chest tightness.        Objective:   Physical Exam  Constitutional: He appears well-developed and well-nourished.  HENT:  Right Ear: External ear normal.  Left Ear: External ear normal.  Nose: Nose normal.  Mouth/Throat: Oropharynx is clear and moist.  Eyes: Conjunctivae are normal.  Pulmonary/Chest: Effort normal and breath sounds normal.  Lymphadenopathy:    He has no cervical adenopathy.          Assessment & Plan:  Add Mucinex

## 2014-10-22 ENCOUNTER — Telehealth: Payer: Self-pay | Admitting: Family Medicine

## 2014-10-22 DIAGNOSIS — E785 Hyperlipidemia, unspecified: Secondary | ICD-10-CM

## 2014-10-22 NOTE — Telephone Encounter (Signed)
Patient wants to go to [facility] to have lipids drawn.  Please enter order.

## 2014-10-22 NOTE — Telephone Encounter (Signed)
Order placed, pt aware 

## 2014-10-24 ENCOUNTER — Other Ambulatory Visit (INDEPENDENT_AMBULATORY_CARE_PROVIDER_SITE_OTHER): Payer: Self-pay

## 2014-10-24 DIAGNOSIS — E785 Hyperlipidemia, unspecified: Secondary | ICD-10-CM

## 2014-10-24 LAB — LIPID PANEL
CHOLESTEROL: 193 mg/dL (ref 0–200)
HDL: 38.7 mg/dL — ABNORMAL LOW (ref >39.00)
LDL Cholesterol: 128 mg/dL — ABNORMAL HIGH (ref 0–99)
NonHDL: 154.3
Total CHOL/HDL Ratio: 5
Triglycerides: 134 mg/dL (ref 0.0–149.0)
VLDL: 26.8 mg/dL (ref 0.0–40.0)

## 2014-10-26 ENCOUNTER — Telehealth: Payer: Self-pay | Admitting: Family Medicine

## 2014-10-26 NOTE — Telephone Encounter (Signed)
I spoke with pt  

## 2014-10-26 NOTE — Telephone Encounter (Signed)
Pt request a call back about lab results.

## 2014-10-29 ENCOUNTER — Telehealth: Payer: Self-pay | Admitting: Family Medicine

## 2014-10-29 NOTE — Telephone Encounter (Signed)
Pt needs to change his Zetia medication due to cost. He spoke with [facility] and they advised him that Lopid was a choice and Niacin is also a choice. Please advise he and his wife, Olin Hauser, on what they should switch to.

## 2014-10-30 NOTE — Telephone Encounter (Signed)
Stop the Zetia and switch to Lopid 600 mg bid. Call in one year supply

## 2014-10-31 MED ORDER — GEMFIBROZIL 600 MG PO TABS: 600.0000 mg | ORAL_TABLET | Freq: Two times a day (BID) | ORAL | Status: DC

## 2014-10-31 NOTE — Telephone Encounter (Signed)
I spoke with pt and sent new script [facility].

## 2015-02-11 ENCOUNTER — Other Ambulatory Visit: Payer: Self-pay | Admitting: Family Medicine

## 2015-02-11 MED ORDER — EZETIMIBE 10 MG PO TABS: 10.0000 mg | ORAL_TABLET | Freq: Every day | ORAL | Status: DC

## 2015-02-11 NOTE — Telephone Encounter (Signed)
Please send in a one year supply to [facility]

## 2015-02-11 NOTE — Telephone Encounter (Signed)
Pt request refill of the following:   ZETIA  Pt said the above rx should be sent to mail order 90 day supply and rx for a year   Phamacy: [facility]

## 2015-02-11 NOTE — Telephone Encounter (Signed)
Rx sent to pharmacy   

## 2015-04-03 ENCOUNTER — Emergency Department (HOSPITAL_COMMUNITY)
Admission: EM | Admit: 2015-04-03 | Discharge: 2015-04-03 | Disposition: A | Discharge status: Home / Self Care | Payer: 59 | Attending: Emergency Medicine | Admitting: Emergency Medicine

## 2015-04-03 ENCOUNTER — Encounter (HOSPITAL_COMMUNITY): Payer: Self-pay | Admitting: Emergency Medicine

## 2015-04-03 ENCOUNTER — Emergency Department (HOSPITAL_COMMUNITY): Payer: 59

## 2015-04-03 DIAGNOSIS — R55 Syncope and collapse: Secondary | ICD-10-CM

## 2015-04-03 DIAGNOSIS — I10 Essential (primary) hypertension: Secondary | ICD-10-CM | POA: Insufficient documentation

## 2015-04-03 DIAGNOSIS — E785 Hyperlipidemia, unspecified: Secondary | ICD-10-CM | POA: Insufficient documentation

## 2015-04-03 DIAGNOSIS — Z8659 Personal history of other mental and behavioral disorders: Secondary | ICD-10-CM | POA: Insufficient documentation

## 2015-04-03 DIAGNOSIS — Z79899 Other long term (current) drug therapy: Secondary | ICD-10-CM | POA: Insufficient documentation

## 2015-04-03 DIAGNOSIS — Z8739 Personal history of other diseases of the musculoskeletal system and connective tissue: Secondary | ICD-10-CM | POA: Diagnosis not present

## 2015-04-03 DIAGNOSIS — Z8619 Personal history of other infectious and parasitic diseases: Secondary | ICD-10-CM | POA: Diagnosis not present

## 2015-04-03 DIAGNOSIS — Z87891 Personal history of nicotine dependence: Secondary | ICD-10-CM | POA: Diagnosis not present

## 2015-04-03 DIAGNOSIS — R197 Diarrhea, unspecified: Secondary | ICD-10-CM | POA: Diagnosis not present

## 2015-04-03 DIAGNOSIS — E079 Disorder of thyroid, unspecified: Secondary | ICD-10-CM | POA: Insufficient documentation

## 2015-04-03 LAB — COMPREHENSIVE METABOLIC PANEL
ALBUMIN: 4.3 g/dL (ref 3.5–5.0)
ALT: 42 U/L (ref 17–63)
ANION GAP: 13 (ref 5–15)
AST: 27 U/L (ref 15–41)
Alkaline Phosphatase: 76 U/L (ref 38–126)
BUN: 27 mg/dL — ABNORMAL HIGH (ref 6–20)
CHLORIDE: 103 mmol/L (ref 101–111)
CO2: 24 mmol/L (ref 22–32)
Calcium: 9.4 mg/dL (ref 8.9–10.3)
Creatinine, Ser: 1.08 mg/dL (ref 0.61–1.24)
GFR calc non Af Amer: >60 mL/min (ref >60)
Glucose, Bld: 176 mg/dL — ABNORMAL HIGH (ref 65–99)
Potassium: 4.6 mmol/L (ref 3.5–5.1)
Sodium: 140 mmol/L (ref 135–145)
Total Bilirubin: 1.1 mg/dL (ref 0.3–1.2)
Total Protein: 7.4 g/dL (ref 6.5–8.1)

## 2015-04-03 LAB — CBC
HCT: 43.9 % (ref 39.0–52.0)
Hemoglobin: 14 g/dL (ref 13.0–17.0)
MCH: 28 pg (ref 26.0–34.0)
MCHC: 31.9 g/dL (ref 30.0–36.0)
MCV: 87.8 fL (ref 78.0–100.0)
PLATELETS: 220 10*3/uL (ref 150–400)
RBC: 5 MIL/uL (ref 4.22–5.81)
RDW: 13.8 % (ref 11.5–15.5)
WBC: 13.4 10*3/uL — AB (ref 4.0–10.5)

## 2015-04-03 MED ORDER — SODIUM CHLORIDE 0.9 % IV BOLUS (SEPSIS)
1000.0000 mL | Freq: Once | INTRAVENOUS | Status: AC
  Administered 2015-04-03: 1000 mL via INTRAVENOUS

## 2015-04-03 MED ORDER — ONDANSETRON 4 MG PO TBDP: 4.0000 mg | ORAL_TABLET | Freq: Three times a day (TID) | ORAL | Status: DC | PRN

## 2015-04-03 MED ORDER — ONDANSETRON HCL 4 MG/2ML IJ SOLN
4.0000 mg | Freq: Once | INTRAMUSCULAR | Status: AC
  Administered 2015-04-03: 4 mg via INTRAVENOUS
  Filled 2015-04-03: qty 2

## 2015-04-03 NOTE — Discharge Instructions (Signed)
Please call your doctor for a followup appointment within 24-48 hours. When you talk to your doctor please let them know that you were seen in the emergency department and have them acquire all of your records so that they can discuss the findings with you and formulate a treatment plan to fully care for your new and ongoing problems. ° °

## 2015-04-03 NOTE — ED Notes (Signed)
Patient transported to X-ray 

## 2015-04-03 NOTE — ED Notes (Signed)
Pt comes in today with EMS from home with a c/o syncope. Pt states that he has had a couple of episodes of diarrhea this morning. When he went to stand up again he fell to the ground. Pt alert and oriented upon arrival.

## 2015-04-03 NOTE — ED Provider Notes (Addendum)
CSN: [id number]     Arrival date & time 04/03/15  1323 History   First MD Initiated Contact with Patient 04/03/15 1332     Chief Complaint  Patient presents with  . Loss of Consciousness     (Consider location/radiation/quality/duration/timing/severity/associated sxs/prior Treatment) HPI Comments: The patient is a 59 year old male who presents by ambulance after he had a syncopal event at home. He states that yesterday he was golfing in the hot sun, did not have very much to drink, today he had a small amount of fluid for breakfast, had an upset stomach and has had multiple episodes of watery diarrhea, mild abdominal cramping, multiple episodes of vomiting, syncopized twice when he tried to stand up, better when he lays down. He denies head injury, he has right shoulder pain after he fell on his right shoulder, she denies any chest pain or difficulty breathing, no palpitations, no fevers or chills.  Patient is a 59 y.o. male presenting with syncope. The history is provided by the patient.  Loss of Consciousness   Past Medical History  Diagnosis Date  . Hyperlipidemia   . Allergy   . Thyroid disease     hypothyroidism  . Arthritis   . Herpes stomatitis   . Depression     per Dr. Caprice Beaver  . Hypertension    Past Surgical History  Procedure Laterality Date  . Colonoscopy  12-09-10    per Dr. Henrene Pastor, repeat in 10 yrs  . Total knee arthroplasty  2008    on left per Dr. Wynelle Link  . Hernia repair      left inguinal   . Nasal septum surgery    . Shoulder arthroscopy  2005    on right per Dr. Wynelle Link  . Esophagus surgery      as infant   . Gastrostomy  1989    per Dr. Annamaria Boots   Family History  Problem Relation Age of Onset  . Heart disease Mother   . Thyroid disease Mother   . Parkinsonism Father    History  Substance Use Topics  . Smoking status: Former Smoker    Types: Cigarettes    Quit date: 10/19/1998  . Smokeless tobacco: Never Used  . Alcohol Use: No    Review of  Systems  Cardiovascular: Positive for syncope.  All other systems reviewed and are negative.     Allergies  Simvastatin and Sulfamethoxazole  Home Medications   Prior to Admission medications   Medication Sig Start Date End Date Taking? Authorizing Provider  acetaminophen (TYLENOL) 325 MG tablet Take 325 mg by mouth every 6 (six) hours as needed for moderate pain or fever.    Yes Historical Provider, MD  acyclovir (ZOVIRAX) 200 MG capsule Take 1 capsule (200 mg total) by mouth as needed. Fever blisters 07/17/14  Yes Laurey Morale, MD  amoxicillin (AMOXIL) 500 MG capsule Take 500 mg by mouth as needed. Take prior to dental visit   Yes Historical Provider, MD  Ascorbic Acid (VITAMIN C) 1000 MG tablet Take 1,000 mg by mouth daily.     Yes Historical Provider, MD  diphenhydrAMINE (BENADRYL) 25 MG tablet Take 25 mg by mouth at bedtime as needed for sleep. For sleep.   Yes Historical Provider, MD  ezetimibe (ZETIA) 10 MG tablet Take 1 tablet (10 mg total) by mouth daily. 02/11/15  Yes Laurey Morale, MD  Flaxseed, Linseed, 1000 MG CAPS Take 1,000 each by mouth 2 (two) times daily.    Yes Historical Provider,  MD  ibuprofen (ADVIL,MOTRIN) 200 MG tablet Take 400 mg by mouth at bedtime.   Yes Historical Provider, MD  levothyroxine (SYNTHROID, LEVOTHROID) 150 MCG tablet Take 1 tablet (150 mcg total) by mouth daily. 07/17/14  Yes Laurey Morale, MD  losartan-hydrochlorothiazide (HYZAAR) 100-25 MG per tablet Take 1 tablet by mouth daily. 07/17/14  Yes Laurey Morale, MD  mometasone (ELOCON) 0.1 % cream Apply topically as needed. Patient taking differently: Apply topically as needed (rash).  07/17/14  Yes Laurey Morale, MD  Multiple Vitamins-Minerals (ONE-A-DAY MENS 50+ ADVANTAGE PO) Take 1 each by mouth daily.     Yes Historical Provider, MD  naftifine (NAFTIN) 1 % cream Apply topically as needed. Patient taking differently: Apply topically as needed (rash).  07/17/14  Yes Laurey Morale, MD  naproxen sodium  (ANAPROX) 220 MG tablet Take 660 mg by mouth every morning.   Yes Historical Provider, MD  POTASSIUM GLUCONATE PO Take 1,190 mg by mouth 4 (four) times a week. Monday, Wednesday, Friday, Sunday   Yes Historical Provider, MD  Sennosides-Docusate Sodium (STOOL SOFTENER LAXATIVE PO) Take by mouth. 3 times weekly- Tuesday, Thursday, Saturday   Yes Historical Provider, MD  sildenafil (VIAGRA) 50 MG tablet Take 0.5 tablets (25 mg total) by mouth daily as needed. Patient taking differently: Take 25 mg by mouth daily as needed for erectile dysfunction.  07/17/14  Yes Laurey Morale, MD  azithromycin [facility]) 250 MG tablet Use as directed Patient not taking: Reported on 04/03/2015 10/16/14   Laurey Morale, MD  gemfibrozil (LOPID) 600 MG tablet Take 1 tablet (600 mg total) by mouth 2 (two) times daily before a meal. Patient not taking: Reported on 04/03/2015 10/31/14   Laurey Morale, MD  HYDROcodone-acetaminophen (NORCO/VICODIN) 5-325 MG per tablet Take 1 tablet by mouth every 6 (six) hours as needed for moderate pain. Patient not taking: Reported on 04/03/2015 10/05/14   Evelina Bucy, MD  HYDROcodone-homatropine (HYDROMET) 5-1.5 MG/5ML syrup Take 5 mLs by mouth every 4 (four) hours as needed. Patient not taking: Reported on 04/03/2015 10/17/14   Laurey Morale, MD  ondansetron (ZOFRAN ODT) 4 MG disintegrating tablet Take 1 tablet (4 mg total) by mouth every 8 (eight) hours as needed for nausea. 04/03/15   Noemi Chapel, MD   BP 116/43 mmHg  Pulse 76  Temp(Src) 98.4 F (36.9 C) (Oral)  Resp 18  SpO2 95% Physical Exam  Constitutional: He appears well-developed and well-nourished. No distress.  HENT:  Head: Normocephalic and atraumatic.  Mouth/Throat: Oropharynx is clear and moist. No oropharyngeal exudate.  No malocclusion, no hemotympanum, no raccoon eyes, no battle sign  Eyes: Conjunctivae and EOM are normal. Pupils are equal, round, and reactive to light. Right eye exhibits no discharge. Left eye  exhibits no discharge. No scleral icterus.  Neck: Normal range of motion. Neck supple. No JVD present. No thyromegaly present.  Cardiovascular: Normal rate, regular rhythm, normal heart sounds and intact distal pulses.  Exam reveals no gallop and no friction rub.   No murmur heard. Pulmonary/Chest: Effort normal and breath sounds normal. No respiratory distress. He has no wheezes. He has no rales.  Abdominal: Soft. He exhibits no distension and no mass. There is no tenderness.  Increased bowel sounds  Musculoskeletal: Normal range of motion. He exhibits tenderness (right shoulder tenderness with decreased range of motion). He exhibits no edema.  Lymphadenopathy:    He has no cervical adenopathy.  Neurological: He is alert. Coordination normal.  Mental status is  normal, answers questions appropriately, clear speech, moves all 4 extremities with normal strength and coordination  Skin: Skin is warm and dry. No rash noted. No erythema.  Psychiatric: He has a normal mood and affect. His behavior is normal.  Nursing note and vitals reviewed.   ED Course  Procedures (including critical care time) Labs Review Labs Reviewed  CBC - Abnormal; Notable for the following:    WBC 13.4 (*)    All other components within normal limits  COMPREHENSIVE METABOLIC PANEL - Abnormal; Notable for the following:    Glucose, Bld 176 (*)    BUN 27 (*)    All other components within normal limits    Imaging Review Dg Shoulder Right  04/03/2015   CLINICAL DATA:  Loss of consciousness, fell on right shoulder, joint pain  EXAM: RIGHT SHOULDER - 2+ VIEW  COMPARISON:  None.  FINDINGS: Three views of the right shoulder submitted. No acute fracture or subluxation. Mild degenerative changes AC joint. Glenohumeral joint is preserved.  IMPRESSION: No acute fracture or subluxation. Mild degenerative changes AC joint.   Electronically Signed   By: Lahoma Crocker M.D.   On: 04/03/2015 14:26     EKG  Interpretation   Date/Time:  Wednesday April 03 2015 13:34:36 EDT Ventricular Rate:  73 PR Interval:  162 QRS Duration: 107 QT Interval:  371 QTC Calculation: 409 R Axis:   -55 Text Interpretation:  Sinus rhythm LAD, consider left anterior fascicular  block Borderline T wave abnormalities Since last tracing Nonspecific T  wave abnormality Present Confirmed by Rennee Coyne  MD, Jassica Zazueta ([id number]) on  04/03/2015 3:49:22 PM      MDM   Final diagnoses:  Syncope, unspecified syncope type  Diarrhea    Image her right shoulder, likely has dehydration and possible electrolyte abnormalities causing syncope, check EKG, fluids, x-ray.  Angiocath insertion Performed by: Johnna Acosta  Consent: Verbal consent obtained. Risks and benefits: risks, benefits and alternatives were discussed Time out: Immediately prior to procedure a "time out" was called to verify the correct patient, procedure, equipment, support staff and site/side marked as required.  Preparation: Patient was prepped and draped in the usual sterile fashion.  Vein Location: L AC  Not Ultrasound Guided  Gauge: 20  Normal blood return and flush without difficulty Patient tolerance: Patient tolerated the procedure well with no immediate complications.  Multiple doses of IV fluids given, antiemetics with improvement, no further vomiting or diarrhea, mild lightheadedness with standing, he does have orthostatic changes, he will need to continue to hydrate at home, these findings and plan were discussed with the patient at length, he is in understanding of the plan and agreeable, x-ray negative for fracture of the shoulder.        Noemi Chapel, MD 04/03/15 1549  Noemi Chapel, MD 04/03/15 [phone]

## 2015-04-03 NOTE — ED Notes (Signed)
Bed: WA04 Expected date:  Expected time:  Means of arrival:  Comments: EMS 

## 2015-04-09 ENCOUNTER — Telehealth: Payer: Self-pay | Admitting: *Deleted

## 2015-04-09 ENCOUNTER — Ambulatory Visit (INDEPENDENT_AMBULATORY_CARE_PROVIDER_SITE_OTHER): Payer: 59 | Admitting: Family Medicine

## 2015-04-09 ENCOUNTER — Encounter: Payer: Self-pay | Admitting: Family Medicine

## 2015-04-09 VITALS — BP 131/71 | HR 59 | Temp 98.1°F | Ht 69.5 in | Wt 230.0 lb

## 2015-04-09 DIAGNOSIS — T671XXD Heat syncope, subsequent encounter: Secondary | ICD-10-CM

## 2015-04-09 DIAGNOSIS — I10 Essential (primary) hypertension: Secondary | ICD-10-CM

## 2015-04-09 MED ORDER — LOSARTAN POTASSIUM 100 MG PO TABS: 100.0000 mg | ORAL_TABLET | Freq: Every day | ORAL | Status: DC

## 2015-04-09 NOTE — Telephone Encounter (Signed)
[name] [facility] Patient Name: Luke Skinner Gender: Male DOB: Sep 13, 1956 Age: 59 Y 3 M 12 D Return Phone Number: [phone] (Primary) Address: [address] City/State/Zip: [place] [address] Client [name] Client Site [facility] - Day Physician [place], [place] Type Call Caller Name mrs. Schwinn Caller Phone Number [phone] Relationship To Patient Spouse Is this call to report lab results? No Call Type General Information Initial Comment Caller States she is needing to get a referral from the dr to dr. [name] for her husband as soon as possible. spoke to the office via the backline and got her husband an appt. with dr. Sarajane Jews at 10:30am. to be evaluated General Information Type Message Only Nurse Assessment Guidelines Guideline Title Affirmed Question Affirmed Notes Nurse Date/Time (Eastern Time) Disp. Time Eilene Ghazi Time) Disposition Final User 04/08/2015 1:35:24 PM General Information Provided Yes Donato Heinz After Care Instructions Given Call Event Type User Date / Time Description

## 2015-04-09 NOTE — Progress Notes (Signed)
   Subjective:    Patient ID: Luke Skinner, male    DOB: [birth year], 59 y.o.   MRN: [id number]  HPI Here to follow up an ER visit on 04-03-15 for syncope. He was golfing in the heat and passed out when he stood up. His testing in the ER was all normal but he was quite dehydrated. He was given IV fluids.  He has felt well since he came home. He is concerned about his BP however. At home over the past few months his systolic readings have often been in the 150s or 160s. His diastolic remains in the 30Q or 80s. He has been taking 1/2 tab of Losartan CT 100/25 daily. He has tried CCBs and beta blockers in the past, but noting has been a good for him. He thinks he should see a cardiologist to help [place] out.    Review of Systems  Constitutional: Negative.   Respiratory: Negative.   Cardiovascular: Negative.   Neurological: Negative.        Objective:   Physical Exam  Constitutional: He is oriented to person, place, and time. He appears well-developed and well-nourished.  Neck: No thyromegaly present.  Cardiovascular: Normal rate, regular rhythm, normal heart sounds and intact distal pulses.   Pulmonary/Chest: Effort normal and breath sounds normal.  Lymphadenopathy:    He has no cervical adenopathy.  Neurological: He is alert and oriented to person, place, and time.          Assessment & Plan:  He has recovered from the syncopal spell and has rehydrated. We will change his BP med to a full Losartan 100 mg tab daily but will take out the HCTZ. Refer to Cardiology

## 2015-04-09 NOTE — Progress Notes (Signed)
Pre visit review using our clinic review tool, if applicable. No additional management support is needed unless otherwise documented below in the visit note. 

## 2015-06-27 ENCOUNTER — Telehealth: Payer: Self-pay | Admitting: *Deleted

## 2015-06-27 NOTE — Telephone Encounter (Signed)
I left a voice message with below information, advised pt to call back.

## 2015-06-27 NOTE — Telephone Encounter (Signed)
To my knowledge every cardiologist in [place] is now part of the same group [facility]). I could refer him to someone in [facility] if he wishes, or to someone with [facility], [facility], etc. What would he like to do?

## 2015-06-27 NOTE — Telephone Encounter (Signed)
Pt called back and he states he will make the decision with Dr Sarajane Jews on which cardiologist to see when he comes in for his appt on Monday. Thanks

## 2015-06-27 NOTE — Telephone Encounter (Signed)
Pt called and was referred to a cardiologist and was told yesterday that the DR he was referred to was retiring and they had no available  opening until middle of OCT. Pt cancel appt. He needs another referral to a cardiologist. Pt prefer a different practice. Pt has a appt to see Dr. Sarajane Jews on 07/01/15 to discuss BP issues. Please advise MD.

## 2015-06-28 NOTE — Telephone Encounter (Signed)
FYI

## 2015-07-01 ENCOUNTER — Ambulatory Visit (INDEPENDENT_AMBULATORY_CARE_PROVIDER_SITE_OTHER): Payer: 59 | Admitting: Family Medicine

## 2015-07-01 ENCOUNTER — Ambulatory Visit: Payer: 59 | Admitting: Cardiology

## 2015-07-01 ENCOUNTER — Encounter: Payer: Self-pay | Admitting: Family Medicine

## 2015-07-01 VITALS — BP 135/70 | HR 69 | Temp 98.4°F | Ht 69.5 in | Wt 240.0 lb

## 2015-07-01 DIAGNOSIS — I10 Essential (primary) hypertension: Secondary | ICD-10-CM

## 2015-07-01 MED ORDER — METOPROLOL SUCCINATE ER 50 MG PO TB24: 50.0000 mg | ORAL_TABLET | Freq: Every day | ORAL | Status: DC

## 2015-07-01 NOTE — Progress Notes (Signed)
   Subjective:    Patient ID: Luke Skinner, male    DOB: 01-Sep-1956, 59 y.o.   MRN: [id number]  HPI Here to follow up on HTN. At our last visit we had discontinued the HCTZ from his regimen and he has been taking Losartan 100 mg daily since then. He has been getting a lot of high BP readings at home with systolic pressures from 287 to 867 and diastolic from 80 to 672. He has not found any readings to be too low. He has almost daily spells of lightheadedness. No HAs or vertigo.    Review of Systems  Constitutional: Negative.   Respiratory: Negative.   Cardiovascular: Negative.   Neurological: Positive for light-headedness.       Objective:   Physical Exam  Constitutional: He is oriented to person, place, and time. He appears well-developed and well-nourished.  HENT:  Right Ear: External ear normal.  Left Ear: External ear normal.  Nose: Nose normal.  Mouth/Throat: Oropharynx is clear and moist.  Eyes: Conjunctivae are normal. Pupils are equal, round, and reactive to light.  Neck: Neck supple. No thyromegaly present.  Cardiovascular: Normal rate, regular rhythm, normal heart sounds and intact distal pulses.   Pulmonary/Chest: Effort normal and breath sounds normal.  Lymphadenopathy:    He has no cervical adenopathy.  Neurological: He is alert and oriented to person, place, and time.          Assessment & Plan:  Labile HTN. Stay on Losartan but add Metoprolol succinate 50 mg a day. He will monitor the BP closely at home. We will have Cardiology see him ASAP.

## 2015-07-01 NOTE — Progress Notes (Signed)
Pre visit review using our clinic review tool, if applicable. No additional management support is needed unless otherwise documented below in the visit note. Pt will get flu vaccine at wife's work.

## 2015-07-05 ENCOUNTER — Other Ambulatory Visit (INDEPENDENT_AMBULATORY_CARE_PROVIDER_SITE_OTHER): Payer: 59

## 2015-07-05 DIAGNOSIS — Z0000 Encounter for general adult medical examination without abnormal findings: Secondary | ICD-10-CM | POA: Diagnosis not present

## 2015-07-05 LAB — HEPATIC FUNCTION PANEL
ALBUMIN: 4.2 g/dL (ref 3.5–5.2)
ALT: 32 U/L (ref 0–53)
AST: 18 U/L (ref 0–37)
Alkaline Phosphatase: 74 U/L (ref 39–117)
Bilirubin, Direct: 0.1 mg/dL (ref 0.0–0.3)
Total Bilirubin: 0.6 mg/dL (ref 0.2–1.2)
Total Protein: 6.8 g/dL (ref 6.0–8.3)

## 2015-07-05 LAB — BASIC METABOLIC PANEL
BUN: 16 mg/dL (ref 6–23)
CHLORIDE: 103 meq/L (ref 96–112)
CO2: 33 meq/L — AB (ref 19–32)
Calcium: 9.7 mg/dL (ref 8.4–10.5)
Creatinine, Ser: 0.95 mg/dL (ref 0.40–1.50)
GFR: 86.09 mL/min (ref >60.00)
Glucose, Bld: 97 mg/dL (ref 70–99)
POTASSIUM: 4.6 meq/L (ref 3.5–5.1)
Sodium: 141 mEq/L (ref 135–145)

## 2015-07-05 LAB — LIPID PANEL
CHOL/HDL RATIO: 4
Cholesterol: 199 mg/dL (ref 0–200)
HDL: 45.8 mg/dL (ref >39.00)
LDL CALC: 117 mg/dL — AB (ref 0–99)
NONHDL: 153.39
TRIGLYCERIDES: 180 mg/dL — AB (ref 0.0–149.0)
VLDL: 36 mg/dL (ref 0.0–40.0)

## 2015-07-05 LAB — CBC WITH DIFFERENTIAL/PLATELET
Basophils Absolute: 0 10*3/uL (ref 0.0–0.1)
Basophils Relative: 0.4 % (ref 0.0–3.0)
Eosinophils Absolute: 0.1 10*3/uL (ref 0.0–0.7)
Eosinophils Relative: 1.7 % (ref 0.0–5.0)
HCT: 40.7 % (ref 39.0–52.0)
Hemoglobin: 13.6 g/dL (ref 13.0–17.0)
Lymphocytes Relative: 30.4 % (ref 12.0–46.0)
Lymphs Abs: 2.3 10*3/uL (ref 0.7–4.0)
MCHC: 33.3 g/dL (ref 30.0–36.0)
MCV: 83.5 fl (ref 78.0–100.0)
MONOS PCT: 8.6 % (ref 3.0–12.0)
Monocytes Absolute: 0.6 10*3/uL (ref 0.1–1.0)
NEUTROS PCT: 58.9 % (ref 43.0–77.0)
Neutro Abs: 4.4 10*3/uL (ref 1.4–7.7)
Platelets: 256 10*3/uL (ref 150.0–400.0)
RBC: 4.87 Mil/uL (ref 4.22–5.81)
RDW: 14.7 % (ref 11.5–15.5)
WBC: 7.5 10*3/uL (ref 4.0–10.5)

## 2015-07-05 LAB — POCT URINALYSIS DIPSTICK
BILIRUBIN UA: NEGATIVE
GLUCOSE UA: NEGATIVE
KETONES UA: NEGATIVE
LEUKOCYTES UA: NEGATIVE
Nitrite, UA: NEGATIVE
Protein, UA: NEGATIVE
Spec Grav, UA: 1.02
Urobilinogen, UA: 0.2
pH, UA: 6

## 2015-07-05 LAB — PSA: PSA: 2.05 ng/mL (ref 0.10–4.00)

## 2015-07-05 LAB — TSH: TSH: 2.8 u[IU]/mL (ref 0.35–4.50)

## 2015-07-18 ENCOUNTER — Ambulatory Visit (INDEPENDENT_AMBULATORY_CARE_PROVIDER_SITE_OTHER): Payer: 59 | Admitting: Family Medicine

## 2015-07-18 ENCOUNTER — Encounter: Payer: Self-pay | Admitting: Family Medicine

## 2015-07-18 VITALS — BP 128/80 | Temp 98.4°F | Ht 69.5 in | Wt 237.0 lb

## 2015-07-18 DIAGNOSIS — Z0000 Encounter for general adult medical examination without abnormal findings: Secondary | ICD-10-CM

## 2015-07-18 DIAGNOSIS — Z23 Encounter for immunization: Secondary | ICD-10-CM | POA: Diagnosis not present

## 2015-07-18 MED ORDER — LEVOTHYROXINE SODIUM 150 MCG PO TABS: 150.0000 ug | ORAL_TABLET | Freq: Every day | ORAL | Status: DC

## 2015-07-18 MED ORDER — ACYCLOVIR 200 MG PO CAPS: 200.0000 mg | ORAL_CAPSULE | ORAL | Status: DC | PRN

## 2015-07-18 MED ORDER — KETOCONAZOLE 2 % EX CREA: 1.0000 "application " | TOPICAL_CREAM | Freq: Two times a day (BID) | CUTANEOUS | Status: DC

## 2015-07-18 MED ORDER — EZETIMIBE 10 MG PO TABS: 10.0000 mg | ORAL_TABLET | Freq: Every day | ORAL | Status: DC

## 2015-07-18 MED ORDER — MOMETASONE FUROATE 0.1 % EX CREA: TOPICAL_CREAM | CUTANEOUS | Status: DC | PRN

## 2015-07-18 NOTE — Progress Notes (Signed)
Pre visit review using our clinic review tool, if applicable. No additional management support is needed unless otherwise documented below in the visit note. 

## 2015-07-18 NOTE — Progress Notes (Signed)
   Subjective:    Patient ID: Luke Skinner, male    DOB: 30-Mar-1956, 59 y.o.   MRN: [id number]  HPI 59 yr old male for a cpx. He has felt well in general. He still has fluctuations in his BP, but the recent addition of Metoprolol to his regimen has been helpful. He is scheduled to see Dr. Debara Pickett for a cardiology evaluation next week.    Review of Systems  Constitutional: Negative.   HENT: Negative.   Eyes: Negative.   Respiratory: Negative.   Cardiovascular: Negative.   Gastrointestinal: Negative.   Genitourinary: Negative.   Musculoskeletal: Negative.   Skin: Negative.   Neurological: Negative.   Psychiatric/Behavioral: Negative.        Objective:   Physical Exam  Constitutional: He is oriented to person, place, and time. He appears well-developed and well-nourished. No distress.  HENT:  Head: Normocephalic and atraumatic.  Right Ear: External ear normal.  Left Ear: External ear normal.  Nose: Nose normal.  Mouth/Throat: Oropharynx is clear and moist. No oropharyngeal exudate.  Eyes: Conjunctivae and EOM are normal. Pupils are equal, round, and reactive to light. Right eye exhibits no discharge. Left eye exhibits no discharge. No scleral icterus.  Neck: Neck supple. No JVD present. No tracheal deviation present. No thyromegaly present.  Cardiovascular: Normal rate, regular rhythm, normal heart sounds and intact distal pulses.  Exam reveals no gallop and no friction rub.   No murmur heard. Pulmonary/Chest: Effort normal and breath sounds normal. No respiratory distress. He has no wheezes. He has no rales. He exhibits no tenderness.  Abdominal: Soft. Bowel sounds are normal. He exhibits no distension and no mass. There is no tenderness. There is no rebound and no guarding.  Genitourinary: Rectum normal, prostate normal and penis normal. Guaiac negative stool. No penile tenderness.  Musculoskeletal: Normal range of motion. He exhibits no edema or tenderness.  Lymphadenopathy:      He has no cervical adenopathy.  Neurological: He is alert and oriented to person, place, and time. He has normal reflexes. No cranial nerve deficit. He exhibits normal muscle tone. Coordination normal.  Skin: Skin is warm and dry. No rash noted. He is not diaphoretic. No erythema. No pallor.  Psychiatric: He has a normal mood and affect. His behavior is normal. Judgment and thought content normal.          Assessment & Plan:  Well exam. We reviewed diet and exercise advice. He will see Dr. Debara Pickett on Monday.

## 2015-07-19 LAB — HEPATITIS C ANTIBODY: HCV Ab: NEGATIVE

## 2015-07-22 ENCOUNTER — Encounter: Payer: Self-pay | Admitting: Internal Medicine

## 2015-07-22 ENCOUNTER — Ambulatory Visit (INDEPENDENT_AMBULATORY_CARE_PROVIDER_SITE_OTHER): Payer: 59 | Admitting: Internal Medicine

## 2015-07-22 VITALS — BP 158/86 | HR 55 | Ht 69.0 in | Wt 235.0 lb

## 2015-07-22 DIAGNOSIS — I10 Essential (primary) hypertension: Secondary | ICD-10-CM | POA: Diagnosis not present

## 2015-07-22 DIAGNOSIS — Z79899 Other long term (current) drug therapy: Secondary | ICD-10-CM

## 2015-07-22 DIAGNOSIS — R0602 Shortness of breath: Secondary | ICD-10-CM

## 2015-07-22 DIAGNOSIS — E785 Hyperlipidemia, unspecified: Secondary | ICD-10-CM

## 2015-07-22 DIAGNOSIS — R9431 Abnormal electrocardiogram [ECG] [EKG]: Secondary | ICD-10-CM | POA: Diagnosis not present

## 2015-07-22 DIAGNOSIS — R5383 Other fatigue: Secondary | ICD-10-CM | POA: Diagnosis not present

## 2015-07-22 MED ORDER — HYDROCHLOROTHIAZIDE 25 MG PO TABS: 25.0000 mg | ORAL_TABLET | Freq: Every day | ORAL | Status: DC

## 2015-07-22 NOTE — Progress Notes (Signed)
OFFICE NOTE  Chief Complaint:  Labile hypertension, fatigue, dyspnea on exertion  Primary Care Physician: Laurey Morale, MD  HPI:  Luke Skinner is a pleasant 59 year old male who was kindly referred to me by Dr. Sarajane Jews for evaluation of a labile hypertension and dyspnea. He recently had changes in his blood pressure medicine including the addition of Toprol-XL 50 mg daily. He had reported some improvement in his blood pressures on this however was having problems with diastolic hypotension. His losartan was then decreased from 100 mg daily to 50 mg daily. Blood pressure then ran higher up in the 093G to 182 systolic. For a few days he took his wife's HCTZ and reported blood pressures in the 993Z to 169 systolic. He has been having some dizziness and thinks this is related to blood pressure changes. He also reports some worsening fatigue and exercise intolerance, although denies any chest pain. He has difficult sleep at night and often times dozes off during the day. His Epworth sleepiness scale score was 10, suggesting the possibility of sleep apnea. He says that he had a sleep study in 2008 and this was negative for apnea, but did report that he does not get into REM sleep. An EKG in the office today is abnormal demonstrating anterolateral T-wave inversions in sinus bradycardia 55.  PMHx:  Past Medical History  Diagnosis Date  . Hyperlipidemia   . Allergy   . Thyroid disease     hypothyroidism  . Arthritis   . Herpes stomatitis   . Depression     per Dr. Caprice Beaver  . Hypertension     Past Surgical History  Procedure Laterality Date  . Colonoscopy  12-09-10    per Dr. Henrene Pastor, repeat in 10 yrs  . Total knee arthroplasty  2008    on left per Dr. Wynelle Link  . Hernia repair      left inguinal   . Nasal septum surgery    . Shoulder arthroscopy  2005    on right per Dr. Wynelle Link  . Esophagus surgery      as infant   . Gastrostomy  1989    per Dr. Annamaria Boots    FAMHx:  Family History   Problem Relation Age of Onset  . Heart disease Mother   . Thyroid disease Mother   . Parkinsonism Father     SOCHx:   reports that he quit smoking about 16 years ago. His smoking use included Cigarettes. He has never used smokeless tobacco. He reports that he does not drink alcohol or use illicit drugs.  ALLERGIES:  Allergies  Allergen Reactions  . Simvastatin     Joint pain.    . Sulfamethoxazole     REACTION: hives    ROS: A comprehensive review of systems was negative except for: Constitutional: positive for fatigue Respiratory: positive for dyspnea on exertion  HOME MEDS: Current Outpatient Prescriptions  Medication Sig Dispense Refill  . acetaminophen (TYLENOL) 325 MG tablet Take 325 mg by mouth every 6 (six) hours as needed for moderate pain or fever.     Marland Kitchen acyclovir (ZOVIRAX) 200 MG capsule Take 1 capsule (200 mg total) by mouth as needed. Fever blisters 180 capsule 3  . amoxicillin (AMOXIL) 500 MG capsule Take 500 mg by mouth as needed. Take prior to dental visit    . Ascorbic Acid (VITAMIN C) 1000 MG tablet Take 1,000 mg by mouth daily.      Marland Kitchen azithromycin (ZITHROMAX) 250 MG tablet Use as directed  6 tablet 0  . diphenhydrAMINE (BENADRYL) 25 MG tablet Take 25 mg by mouth at bedtime as needed for sleep. For sleep.    Marland Kitchen ezetimibe (ZETIA) 10 MG tablet Take 1 tablet (10 mg total) by mouth daily. 90 tablet 3  . Flaxseed, Linseed, 1000 MG CAPS Take 1,000 each by mouth 2 (two) times daily.     Marland Kitchen HYDROcodone-acetaminophen (NORCO/VICODIN) 5-325 MG per tablet Take 1 tablet by mouth every 6 (six) hours as needed for moderate pain. 20 tablet 0  . HYDROcodone-homatropine (HYDROMET) 5-1.5 MG/5ML syrup Take 5 mLs by mouth every 4 (four) hours as needed. 240 mL 0  . ibuprofen (ADVIL,MOTRIN) 200 MG tablet Take 600 mg by mouth at bedtime.     Marland Kitchen ketoconazole (NIZORAL) 2 % cream Apply 1 application topically 2 (two) times daily. 30 g 5  . levothyroxine (SYNTHROID, LEVOTHROID) 150 MCG  tablet Take 1 tablet (150 mcg total) by mouth daily. 90 tablet 3  . losartan (COZAAR) 100 MG tablet Take 1 tablet (100 mg total) by mouth daily. 90 tablet 3  . metoprolol succinate (TOPROL-XL) 50 MG 24 hr tablet Take 1 tablet (50 mg total) by mouth daily. Take with or immediately following a meal. 30 tablet 3  . mometasone (ELOCON) 0.1 % cream Apply topically as needed. 45 g 11  . Multiple Vitamins-Minerals (ONE-A-DAY MENS 50+ ADVANTAGE PO) Take 1 each by mouth daily.      . naproxen sodium (ANAPROX) 220 MG tablet Take 220 mg by mouth every morning.     . ondansetron (ZOFRAN ODT) 4 MG disintegrating tablet Take 1 tablet (4 mg total) by mouth every 8 (eight) hours as needed for nausea. 10 tablet 0  . POTASSIUM GLUCONATE PO Take 1,190 mg by mouth 4 (four) times a week. Monday, Wednesday, Friday, Sunday    . Sennosides-Docusate Sodium (STOOL SOFTENER LAXATIVE PO) Take by mouth. 3 times weekly- Tuesday, Thursday, Saturday    . sildenafil (VIAGRA) 50 MG tablet Take 0.5 tablets (25 mg total) by mouth daily as needed. 30 tablet 3  . hydrochlorothiazide (HYDRODIURIL) 25 MG tablet Take 1 tablet (25 mg total) by mouth daily. 90 tablet 3   No current facility-administered medications for this visit.    LABS/IMAGING: No results found for this or any previous visit (from the past 48 hour(s)). No results found.  WEIGHTS: Wt Readings from Last 3 Encounters:  07/22/15 235 lb (106.595 kg)  07/18/15 237 lb (107.502 kg)  07/01/15 240 lb (108.863 kg)    VITALS: BP 158/86 mmHg  Pulse 55  Ht 5\' 9"  (1.753 m)  Wt 235 lb (106.595 kg)  BMI 34.69 kg/m2  EXAM: General appearance: alert, no distress and moderately obese Neck: no carotid bruit and no JVD Lungs: clear to auscultation bilaterally Heart: regular rate and rhythm, S1, S2 normal, no murmur, click, rub or gallop Abdomen: soft, non-tender; bowel sounds normal; no masses,  no organomegaly Extremities: extremities normal, atraumatic, no cyanosis or  edema Pulses: 2+ and symmetric Skin: Skin color, texture, turgor normal. No rashes or lesions Neurologic: Grossly normal Psych: Pleasant  EKG: Sinus bradycardia 55, anterolateral ST and T-wave abnormalities suggestive of ischemia  ASSESSMENT: 1. Fatigue and dyspnea on exertion with abnormal ischemic EKG changes 2. Labile hypertension 3. High risk features for sleep apnea  PLAN: 1.   Mr. Ratchford has been describing some dizziness and progressive shortness of breath with fatigue and exercise intolerance. His EKG does show ischemic changes anterolaterally and I would recommend further evaluation  with an exercise Myoview. This could also explain his labile hypertension recently. He seems to have improved somewhat with the beta blocker which may be medical therapy for coronary disease. He also had better blood pressure control on a thiazide. I recommended adding back HCTZ 25 mg daily. We'll recheck labs later this week.  We also discussed sleep apnea and his elevated Epworth sleepiness scale score. He does not seem interested in a repeat sleep study, although it is possible for him to have developed apnea since his last study in 2008.  Thanks for the kind referral. Plan to see him back to follow-up his stress test in a few weeks.  Pixie Casino, MD, [facility] Attending Cardiologist [place] 07/22/2015, 11:56 AM

## 2015-07-22 NOTE — Patient Instructions (Signed)
Medication Instructions:   START hydrochlorothiazide 25mg  once daily  Labwork:  End of this week - BMET  Testing/Procedures:  Your physician has requested that you have en exercise stress myoview. For further information please visit [email or web address]. Please follow instruction sheet, as given.   Follow-Up:  3-4 weeks with Dr. Debara Pickett  Any Other Special Instructions Will Be Listed Below (If Applicable).

## 2015-07-27 LAB — BASIC METABOLIC PANEL
BUN: 15 mg/dL (ref 7–25)
CHLORIDE: 98 mmol/L (ref 98–110)
CO2: 30 mmol/L (ref 20–31)
CREATININE: 0.92 mg/dL (ref 0.70–1.33)
Calcium: 9.4 mg/dL (ref 8.6–10.3)
GLUCOSE: 113 mg/dL — AB (ref 65–99)
Potassium: 4.4 mmol/L (ref 3.5–5.3)
Sodium: 135 mmol/L (ref 135–146)

## 2015-08-01 ENCOUNTER — Telehealth (HOSPITAL_COMMUNITY): Payer: Self-pay

## 2015-08-01 NOTE — Telephone Encounter (Signed)
Encounter complete. 

## 2015-08-06 ENCOUNTER — Ambulatory Visit (HOSPITAL_COMMUNITY)
Admission: RE | Admit: 2015-08-06 | Discharge: 2015-08-06 | Disposition: A | Discharge status: Home / Self Care | Payer: 59 | Source: Ambulatory Visit | Attending: Cardiovascular Disease | Admitting: Cardiovascular Disease

## 2015-08-06 DIAGNOSIS — I10 Essential (primary) hypertension: Secondary | ICD-10-CM | POA: Insufficient documentation

## 2015-08-06 DIAGNOSIS — R5383 Other fatigue: Secondary | ICD-10-CM | POA: Insufficient documentation

## 2015-08-06 DIAGNOSIS — R0609 Other forms of dyspnea: Secondary | ICD-10-CM | POA: Insufficient documentation

## 2015-08-06 DIAGNOSIS — R9431 Abnormal electrocardiogram [ECG] [EKG]: Secondary | ICD-10-CM | POA: Diagnosis not present

## 2015-08-06 DIAGNOSIS — R0602 Shortness of breath: Secondary | ICD-10-CM | POA: Diagnosis not present

## 2015-08-06 DIAGNOSIS — R55 Syncope and collapse: Secondary | ICD-10-CM | POA: Insufficient documentation

## 2015-08-06 DIAGNOSIS — R42 Dizziness and giddiness: Secondary | ICD-10-CM | POA: Insufficient documentation

## 2015-08-06 DIAGNOSIS — Z8249 Family history of ischemic heart disease and other diseases of the circulatory system: Secondary | ICD-10-CM | POA: Insufficient documentation

## 2015-08-06 LAB — MYOCARDIAL PERFUSION IMAGING
CHL CUP MPHR: 161 {beats}/min
CHL CUP RESTING HR STRESS: 50 {beats}/min
CHL RATE OF PERCEIVED EXERTION: 17
CSEPEDS: 36 s
CSEPEW: 10.4 METS
CSEPHR: 89 %
CSEPPHR: 144 {beats}/min
Exercise duration (min): 9 min
LVDIAVOL: 132 mL
LVSYSVOL: 57 mL
NUC STRESS TID: 0.97
SDS: 0
SRS: 0
SSS: 0

## 2015-08-06 MED ORDER — TECHNETIUM TC 99M SESTAMIBI GENERIC - CARDIOLITE
10.7000 | Freq: Once | INTRAVENOUS | Status: AC | PRN
  Administered 2015-08-06: 10.7 via INTRAVENOUS

## 2015-08-06 MED ORDER — TECHNETIUM TC 99M SESTAMIBI GENERIC - CARDIOLITE
30.7000 | Freq: Once | INTRAVENOUS | Status: AC | PRN
  Administered 2015-08-06: 30.7 via INTRAVENOUS

## 2015-08-21 ENCOUNTER — Encounter: Payer: Self-pay | Admitting: Internal Medicine

## 2015-08-21 ENCOUNTER — Ambulatory Visit (INDEPENDENT_AMBULATORY_CARE_PROVIDER_SITE_OTHER): Payer: 59 | Admitting: Internal Medicine

## 2015-08-21 VITALS — BP 136/74 | HR 57 | Ht 69.0 in | Wt 238.9 lb

## 2015-08-21 DIAGNOSIS — E785 Hyperlipidemia, unspecified: Secondary | ICD-10-CM

## 2015-08-21 DIAGNOSIS — R9431 Abnormal electrocardiogram [ECG] [EKG]: Secondary | ICD-10-CM | POA: Diagnosis not present

## 2015-08-21 DIAGNOSIS — I10 Essential (primary) hypertension: Secondary | ICD-10-CM

## 2015-08-21 NOTE — Patient Instructions (Signed)
Medication Instructions:  Your physician recommends that you continue on your current medications as directed. Please refer to the Current Medication list given to you today.   Labwork: none  Testing/Procedures: none  Follow-Up: Your physician recommends that you schedule a follow-up appointment in: 2 months with Dr. Debara Pickett.   Any Other Special Instructions Will Be Listed Below (If Applicable).     If you need a refill on your cardiac medications before your next appointment, please call your pharmacy.

## 2015-08-21 NOTE — Progress Notes (Signed)
OFFICE NOTE  Chief Complaint:  Follow-up stress test  Primary Care Physician: Luke Morale, MD  HPI:  Luke Skinner is a pleasant 58 year old male who was kindly referred to me by Luke Skinner for evaluation of a labile hypertension and dyspnea. He recently had changes in his blood pressure medicine including the addition of Toprol-XL 50 mg daily. He had reported some improvement in his blood pressures on this however was having problems with diastolic hypotension. His losartan was then decreased from 100 mg daily to 50 mg daily. Blood pressure then ran higher up in the 161W to 960 systolic. For a few days he took his wife's HCTZ and reported blood pressures in the 454U to 981 systolic. He has been having some dizziness and thinks this is related to blood pressure changes. He also reports some worsening fatigue and exercise intolerance, although denies any chest pain. He has difficult sleep at night and often times dozes off during the day. His Epworth sleepiness scale score was 10, suggesting the possibility of sleep apnea. He says that he had a sleep study in 2008 and this was negative for apnea, but did report that he does not get into REM sleep. An EKG in the office today is abnormal demonstrating anterolateral T-wave inversions in sinus bradycardia 55.  I saw Mr. Dr. back in the office today for follow-up of his stress test. This was negative for ischemia. Notably with the change in his medications including adding back his HCTZ, the blood pressure seems to be better controlled. He is currently on losartan 50 mg (one half tablet and (daily, HCTZ 25 mg daily and Toprol-XL 50 mg which she takes at night. He denies any worsening chest pain or shortness of breath.  PMHx:  Past Medical History  Diagnosis Date  . Hyperlipidemia   . Allergy   . Thyroid disease     hypothyroidism  . Arthritis   . Herpes stomatitis   . Depression     per Luke Skinner  . Hypertension     Past Surgical  History  Procedure Laterality Date  . Colonoscopy  12-09-10    per Luke Skinner, repeat in 10 yrs  . Total knee arthroplasty  2008    on left per Luke Skinner  . Hernia repair      left inguinal   . Nasal septum surgery    . Shoulder arthroscopy  2005    on right per Luke Skinner  . Esophagus surgery      as infant   . Gastrostomy  1989    per Luke Skinner    FAMHx:  Family History  Problem Relation Age of Onset  . Heart disease Mother   . Thyroid disease Mother   . Parkinsonism Father     SOCHx:   reports that he quit smoking about 16 years ago. His smoking use included Cigarettes. He has a 14 pack-year smoking history. He has never used smokeless tobacco. He reports that he does not drink alcohol or use illicit drugs.  ALLERGIES:  Allergies  Allergen Reactions  . Simvastatin     Joint pain.    . Sulfamethoxazole     REACTION: hives    ROS: A comprehensive review of systems was negative.  HOME MEDS: Current Outpatient Prescriptions  Medication Sig Dispense Refill  . acetaminophen (TYLENOL) 325 MG tablet Take 325 mg by mouth every 6 (six) hours as needed for moderate pain or fever.     Marland Kitchen acyclovir (ZOVIRAX)  200 MG capsule Take 1 capsule (200 mg total) by mouth as needed. Fever blisters 180 capsule 3  . amoxicillin (AMOXIL) 500 MG capsule Take 500 mg by mouth as needed. Take prior to dental visit    . Ascorbic Acid (VITAMIN C) 1000 MG tablet Take 1,000 mg by mouth daily.      Marland Kitchen azithromycin (ZITHROMAX) 250 MG tablet Use as directed 6 tablet 0  . diphenhydrAMINE (BENADRYL) 25 MG tablet Take 25 mg by mouth at bedtime as needed for sleep. For sleep.    Marland Kitchen ezetimibe (ZETIA) 10 MG tablet Take 1 tablet (10 mg total) by mouth daily. 90 tablet 3  . Flaxseed, Linseed, 1000 MG CAPS Take 1,000 each by mouth 2 (two) times daily.     . hydrochlorothiazide (HYDRODIURIL) 25 MG tablet Take 1 tablet (25 mg total) by mouth daily. 90 tablet 3  . HYDROcodone-acetaminophen (NORCO/VICODIN) 5-325  MG per tablet Take 1 tablet by mouth every 6 (six) hours as needed for moderate pain. 20 tablet 0  . HYDROcodone-homatropine (HYDROMET) 5-1.5 MG/5ML syrup Take 5 mLs by mouth every 4 (four) hours as needed. 240 mL 0  . ibuprofen (ADVIL,MOTRIN) 200 MG tablet Take 600 mg by mouth at bedtime.     Marland Kitchen ketoconazole (NIZORAL) 2 % cream Apply 1 application topically 2 (two) times daily. 30 g 5  . levothyroxine (SYNTHROID, LEVOTHROID) 150 MCG tablet Take 1 tablet (150 mcg total) by mouth daily. 90 tablet 3  . losartan (COZAAR) 100 MG tablet Take 1 tablet (100 mg total) by mouth daily. 90 tablet 3  . metoprolol succinate (TOPROL-XL) 50 MG 24 hr tablet Take 1 tablet (50 mg total) by mouth daily. Take with or immediately following a meal. 30 tablet 3  . mometasone (ELOCON) 0.1 % cream Apply topically as needed. 45 g 11  . Multiple Vitamins-Minerals (ONE-A-DAY MENS 50+ ADVANTAGE PO) Take 1 each by mouth daily.      . naproxen sodium (ANAPROX) 220 MG tablet Take 220 mg by mouth every morning.     . ondansetron (ZOFRAN ODT) 4 MG disintegrating tablet Take 1 tablet (4 mg total) by mouth every 8 (eight) hours as needed for nausea. 10 tablet 0  . POTASSIUM GLUCONATE PO Take 1,190 mg by mouth 4 (four) times a week. Monday, Wednesday, Friday, Sunday    . Sennosides-Docusate Sodium (STOOL SOFTENER LAXATIVE PO) Take by mouth. 3 times weekly- Tuesday, Thursday, Saturday    . sildenafil (VIAGRA) 50 MG tablet Take 0.5 tablets (25 mg total) by mouth daily as needed. 30 tablet 3   No current facility-administered medications for this visit.    LABS/IMAGING: No results found for this or any previous visit (from the past 48 hour(s)). No results found.  WEIGHTS: Wt Readings from Last 3 Encounters:  08/21/15 238 lb 14.4 oz (108.364 kg)  08/06/15 235 lb (106.595 kg)  07/22/15 235 lb (106.595 kg)    VITALS: BP 136/74 mmHg  Pulse 57  Ht 5\' 9"  (1.753 m)  Wt 238 lb 14.4 oz (108.364 kg)  BMI 35.26  kg/m2  EXAM: Deferred  EKG: Deferred   ASSESSMENT: 1. Fatigue and dyspnea on exertion with abnormal ischemic EKG changes - low risk Myoview 2. Labile hypertension - currently improved 3. High risk features for sleep apnea  PLAN: 1.   Mr. Mcgreal has had some improvement in his symptoms. Blood pressure seems to be a little better controlled now. This may need to be adjusted further in the future. One option  could be a longer acting diuretic such as chlorthalidone instead of HCTZ. For now I would continue his current medications. His Myoview was low risk. His symptoms may be related to hypertension. Plan to see him back annually or sooner as necessary for medication adjustment.  Pixie Casino, MD, [facility] Attending Cardiologist [facility] 08/21/2015, 1:24 PM

## 2015-10-17 ENCOUNTER — Telehealth: Payer: Self-pay | Admitting: Internal Medicine

## 2015-10-17 MED ORDER — METOPROLOL SUCCINATE ER 50 MG PO TB24: 50.0000 mg | ORAL_TABLET | Freq: Every day | ORAL | Status: DC

## 2015-10-17 MED ORDER — LOSARTAN POTASSIUM-HCTZ 100-25 MG PO TABS: 1.0000 | ORAL_TABLET | Freq: Every day | ORAL | Status: DC

## 2015-10-17 NOTE — Telephone Encounter (Signed)
Ok to combine losartan with HCTZ (100/25 mg daily). No indication to do twice daily dosing or increase dose. It is beneficial for him to be on Toprol as well and I would continue that. We had discussed changing to chlorthalidone, from HCTZ, but why don't we just keep the HCTZ and put them together for convenience.  Please refill the Toprol as well.  Thanks.  Dr. Lemmie Evens

## 2015-10-17 NOTE — Telephone Encounter (Signed)
Called patient and gave him instructions from Dr. Debara Pickett: Ok to combine losartan with HCTZ (100/25 mg daily). No indication to do twice daily dosing or increase dose. It is beneficial for him to be on Toprol as well and I would continue that. We had discussed changing to chlorthalidone, from HCTZ, but why don't we just keep the HCTZ and put them together for convenience.  Please refill the Toprol as well   Patient repeated instructions correctly.  Rx sent to [facility] on [address]

## 2015-10-17 NOTE — Telephone Encounter (Signed)
Patient left message to call him about his metoprolol,HCTZ and Lorsartan. It is getting time for a refill. Patient asked about combining the Losartan and HCTZ to take twice a day and doing away with the metoprolol. Patient believes Dr. Debara Pickett talked about doing such at the office visit 11/02 Patient would like to change to the combination RX and doing away with the metoprolol prior to getting a refill. He has four days of medications left  Routed to Dr. Debara Pickett

## 2015-10-17 NOTE — Telephone Encounter (Signed)
Please call,concerning his Metoprolol. °

## 2015-10-31 ENCOUNTER — Ambulatory Visit (INDEPENDENT_AMBULATORY_CARE_PROVIDER_SITE_OTHER): Payer: 59 | Admitting: Family Medicine

## 2015-10-31 ENCOUNTER — Encounter: Payer: Self-pay | Admitting: Family Medicine

## 2015-10-31 VITALS — BP 108/60 | HR 72 | Temp 98.1°F | Ht 69.0 in | Wt 245.5 lb

## 2015-10-31 DIAGNOSIS — J069 Acute upper respiratory infection, unspecified: Secondary | ICD-10-CM | POA: Diagnosis not present

## 2015-10-31 NOTE — Patient Instructions (Signed)
INSTRUCTIONS FOR UPPER RESPIRATORY INFECTION:  -plenty of rest and fluids  -nasal saline wash 2-3 times daily (use prepackaged nasal saline or bottled/distilled water if making your own)   -can use AFRIN nasal spray for drainage and nasal congestion - but do NOT use longer then 3-4 days  -in the winter time, using a humidifier at night is helpful (please follow cleaning instructions)  -if you are taking a cough medication - use only as directed, may also try a teaspoon of honey to coat the throat and throat lozenges. If given a cough medication with codeine or hydrocodone or other narcotic please be advised that this contains a strong and  potentially addicting medication. Please follow instructions carefully, take as little as possible and only use AS NEEDED for severe cough. Discuss potential side effects with your pharmacy. Please do not drive or operate machinery while taking these types of medications. Please do not take other sedating medications, drugs or alcohol while taking this medication without discussing with your doctor.  -for sore throat, salt water gargles can help  -follow up if you have fevers, facial pain, tooth pain, difficulty breathing or are worsening or symptoms persist longer then expected  Upper Respiratory Infection, Adult An upper respiratory infection (URI) is also known as the common cold. It is often caused by a type of germ (virus). Colds are easily spread (contagious). You can pass it to others by kissing, coughing, sneezing, or drinking out of the same glass. Usually, you get better in 1 to 3  weeks.  However, the cough can last for even longer. HOME CARE   Only take medicine as told by your doctor. Follow instructions provided above.  Drink enough water and fluids to keep your pee (urine) clear or pale yellow.  Get plenty of rest.  Return to work when your temperature is < 100 for 24 hours or as told by your doctor. You may use a face mask and wash your  hands to stop your cold from spreading. GET HELP RIGHT AWAY IF:   After the first few days, you feel you are getting worse.  You have questions about your medicine.  You have chills, shortness of breath, or red spit (mucus).  You have pain in the face for more then 1-2 days, especially when you bend forward.  You have a fever, puffy (swollen) neck, pain when you swallow, or white spots in the back of your throat.  You have a bad headache, ear pain, sinus pain, or chest pain.  You have a high-pitched whistling sound when you breathe in and out (wheezing).  You cough up blood.  You have sore muscles or a stiff neck. MAKE SURE YOU:   Understand these instructions.  Will watch your condition.  Will get help right away if you are not doing well or get worse. Document Released: 03/23/2008 Document Revised: 12/28/2011 Document Reviewed: 01/10/2014 Exit[employer] Patient Information 2015 [employer]. This information is not intended to replace advice given to you by your health care provider. Make sure you discuss any questions you have with your health care provider.  

## 2015-10-31 NOTE — Progress Notes (Signed)
HPI:   URI: -started: 4-5 days ago, getting better but wanted to get checked out as reports has hx flu with pneumonia -symptoms:nasal congestion, sore throat, cough -denies:fever, SOB, NVD, tooth pain, body aches, chills, sinus pain -has tried: cough medication he has from prior illness - has plenty has been effective -sick contacts/travel/risks: denies flu exposure, tick exposure or or Ebola risks -Hx of: allergies -reports he saw his opthomologist for L eye irritation and has abrasion from chopping wood and is on tx for this and is improving  ROS: See pertinent positives and negatives per HPI.  Past Medical History  Diagnosis Date  . Hyperlipidemia   . Allergy   . Thyroid disease     hypothyroidism  . Arthritis   . Herpes stomatitis   . Depression     per Dr. Caprice Beaver  . Hypertension     Past Surgical History  Procedure Laterality Date  . Colonoscopy  12-09-10    per Dr. Henrene Pastor, repeat in 10 yrs  . Total knee arthroplasty  2008    on left per Dr. Wynelle Link  . Hernia repair      left inguinal   . Nasal septum surgery    . Shoulder arthroscopy  2005    on right per Dr. Wynelle Link  . Esophagus surgery      as infant   . Gastrostomy  1989    per Dr. Annamaria Boots    Family History  Problem Relation Age of Onset  . Heart disease Mother   . Thyroid disease Mother   . Parkinsonism Father     Social History   Social History  . Marital Status: Married    Spouse Name: N/A  . Number of Children: N/A  . Years of Education: 71   Social History Main Topics  . Smoking status: Former Smoker -- 0.50 packs/day for 28 years    Types: Cigarettes    Quit date: 10/19/1998  . Smokeless tobacco: Never Used  . Alcohol Use: No  . Drug Use: No  . Sexual Activity: Not Asked   Other Topics Concern  . None   Social History Narrative   Epworth Sleepiness Scale = 10 (07/22/15)     Current outpatient prescriptions:  .  acetaminophen (TYLENOL) 325 MG tablet, Take 325 mg by mouth  every 6 (six) hours as needed for moderate pain or fever. , Disp: , Rfl:  .  acyclovir (ZOVIRAX) 200 MG capsule, Take 1 capsule (200 mg total) by mouth as needed. Fever blisters, Disp: 180 capsule, Rfl: 3 .  amoxicillin (AMOXIL) 500 MG capsule, Take 500 mg by mouth as needed. Take prior to dental visit, Disp: , Rfl:  .  Ascorbic Acid (VITAMIN C) 1000 MG tablet, Take 1,000 mg by mouth daily.  , Disp: , Rfl:  .  diphenhydrAMINE (BENADRYL) 25 MG tablet, Take 25 mg by mouth at bedtime as needed for sleep. For sleep., Disp: , Rfl:  .  ezetimibe (ZETIA) 10 MG tablet, Take 1 tablet (10 mg total) by mouth daily., Disp: 90 tablet, Rfl: 3 .  Flaxseed, Linseed, 1000 MG CAPS, Take 1,000 each by mouth 2 (two) times daily. , Disp: , Rfl:  .  HYDROcodone-acetaminophen (NORCO/VICODIN) 5-325 MG per tablet, Take 1 tablet by mouth every 6 (six) hours as needed for moderate pain., Disp: 20 tablet, Rfl: 0 .  HYDROcodone-homatropine (HYDROMET) 5-1.5 MG/5ML syrup, Take 5 mLs by mouth every 4 (four) hours as needed., Disp: 240 mL, Rfl: 0 .  ibuprofen (  ADVIL,MOTRIN) 200 MG tablet, Take 600 mg by mouth at bedtime. , Disp: , Rfl:  .  ketoconazole (NIZORAL) 2 % cream, Apply 1 application topically 2 (two) times daily., Disp: 30 g, Rfl: 5 .  levothyroxine (SYNTHROID, LEVOTHROID) 150 MCG tablet, Take 1 tablet (150 mcg total) by mouth daily., Disp: 90 tablet, Rfl: 3 .  losartan-hydrochlorothiazide (HYZAAR) 100-25 MG tablet, Take 1 tablet by mouth daily., Disp: 90 tablet, Rfl: 3 .  metoprolol succinate (TOPROL-XL) 50 MG 24 hr tablet, Take 1 tablet (50 mg total) by mouth daily. Take with or immediately following a meal., Disp: 90 tablet, Rfl: 3 .  mometasone (ELOCON) 0.1 % cream, Apply topically as needed., Disp: 45 g, Rfl: 11 .  Multiple Vitamins-Minerals (ONE-A-DAY MENS 50+ ADVANTAGE PO), Take 1 each by mouth daily.  , Disp: , Rfl:  .  naproxen sodium (ANAPROX) 220 MG tablet, Take 220 mg by mouth every morning. , Disp: , Rfl:  .   ondansetron (ZOFRAN ODT) 4 MG disintegrating tablet, Take 1 tablet (4 mg total) by mouth every 8 (eight) hours as needed for nausea., Disp: 10 tablet, Rfl: 0 .  POTASSIUM GLUCONATE PO, Take 1,190 mg by mouth 4 (four) times a week. Monday, Wednesday, Friday, Sunday, Disp: , Rfl:  .  Sennosides-Docusate Sodium (STOOL SOFTENER LAXATIVE PO), Take by mouth. 3 times weekly- Tuesday, Thursday, Saturday, Disp: , Rfl:  .  sildenafil (VIAGRA) 50 MG tablet, Take 0.5 tablets (25 mg total) by mouth daily as needed., Disp: 30 tablet, Rfl: 3  EXAM:  Filed Vitals:   10/31/15 1340  BP: 108/60  Pulse: 72  Temp: 98.1 F (36.7 C)    Body mass index is 36.24 kg/(m^2).  GENERAL: vitals reviewed and listed above, alert, oriented, appears well hydrated and in no acute distress  HEENT: atraumatic, conjunttiva clear R, mild erythema L, no obvious abnormalities on inspection of external nose and ears, normal appearance of ear canals and TMs, clear nasal congestion, mild post oropharyngeal erythema with PND, no tonsillar edema or exudate, no sinus TTP  NECK: no obvious masses on inspection  LUNGS: clear to auscultation bilaterally, no wheezes, rales or rhonchi, good air movement  CV: HRRR, no peripheral edema  MS: moves all extremities without noticeable abnormality  PSYCH: pleasant and cooperative, no obvious depression or anxiety  ASSESSMENT AND PLAN:  Discussed the following assessment and plan:  Acute upper respiratory infection  -given HPI and exam findings today, a serious infection or illness is unlikely. We discussed potential etiologies, with VURI being most likely, and advised supportive care and monitoring. We discussed treatment side effects, likely course, antibiotic misuse, transmission, and signs of developing a serious illness. -of course, we advised to return or notify a doctor immediately if symptoms worsen or persist or new concerns arise.    Patient Instructions  INSTRUCTIONS FOR  UPPER RESPIRATORY INFECTION:  -plenty of rest and fluids  -nasal saline wash 2-3 times daily (use prepackaged nasal saline or bottled/distilled water if making your own)   -can use AFRIN nasal spray for drainage and nasal congestion - but do NOT use longer then 3-4 days  -in the winter time, using a humidifier at night is helpful (please follow cleaning instructions)  -if you are taking a cough medication - use only as directed, may also try a teaspoon of honey to coat the throat and throat lozenges. If given a cough medication with codeine or hydrocodone or other narcotic please be advised that this contains a strong and  potentially addicting medication. Please follow instructions carefully, take as little as possible and only use AS NEEDED for severe cough. Discuss potential side effects with your pharmacy. Please do not drive or operate machinery while taking these types of medications. Please do not take other sedating medications, drugs or alcohol while taking this medication without discussing with your doctor.  -for sore throat, salt water gargles can help  -follow up if you have fevers, facial pain, tooth pain, difficulty breathing or are worsening or symptoms persist longer then expected  Upper Respiratory Infection, Adult An upper respiratory infection (URI) is also known as the common cold. It is often caused by a type of germ (virus). Colds are easily spread (contagious). You can pass it to others by kissing, coughing, sneezing, or drinking out of the same glass. Usually, you get better in 1 to 3  weeks.  However, the cough can last for even longer. HOME CARE   Only take medicine as told by your doctor. Follow instructions provided above.  Drink enough water and fluids to keep your pee (urine) clear or pale yellow.  Get plenty of rest.  Return to work when your temperature is < 100 for 24 hours or as told by your doctor. You may use a face mask and wash your hands to stop your  cold from spreading. GET HELP RIGHT AWAY IF:   After the first few days, you feel you are getting worse.  You have questions about your medicine.  You have chills, shortness of breath, or red spit (mucus).  You have pain in the face for more then 1-2 days, especially when you bend forward.  You have a fever, puffy (swollen) neck, pain when you swallow, or white spots in the back of your throat.  You have a bad headache, ear pain, sinus pain, or chest pain.  You have a high-pitched whistling sound when you breathe in and out (wheezing).  You cough up blood.  You have sore muscles or a stiff neck. MAKE SURE YOU:   Understand these instructions.  Will watch your condition.  Will get help right away if you are not doing well or get worse. Document Released: 03/23/2008 Document Revised: 12/28/2011 Document Reviewed: 01/10/2014 [facility] Patient Information 2015 [place], [place]. This information is not intended to replace advice given to you by your health care provider. Make sure you discuss any questions you have with your health care provider.      Colin Benton R.

## 2015-10-31 NOTE — Progress Notes (Signed)
Pre visit review using our clinic review tool, if applicable. No additional management support is needed unless otherwise documented below in the visit note. 

## 2015-11-01 ENCOUNTER — Encounter: Payer: Self-pay | Admitting: Internal Medicine

## 2015-11-01 ENCOUNTER — Ambulatory Visit (INDEPENDENT_AMBULATORY_CARE_PROVIDER_SITE_OTHER): Payer: 59 | Admitting: Internal Medicine

## 2015-11-01 ENCOUNTER — Other Ambulatory Visit: Payer: Self-pay | Admitting: Internal Medicine

## 2015-11-01 VITALS — BP 130/72 | HR 64 | Ht 69.0 in | Wt 243.2 lb

## 2015-11-01 DIAGNOSIS — I10 Essential (primary) hypertension: Secondary | ICD-10-CM

## 2015-11-01 DIAGNOSIS — E785 Hyperlipidemia, unspecified: Secondary | ICD-10-CM

## 2015-11-01 MED ORDER — METOPROLOL SUCCINATE ER 50 MG PO TB24: 50.0000 mg | ORAL_TABLET | Freq: Every day | ORAL | Status: DC

## 2015-11-01 MED ORDER — LOSARTAN POTASSIUM-HCTZ 100-25 MG PO TABS: 1.0000 | ORAL_TABLET | Freq: Every day | ORAL | Status: DC

## 2015-11-01 NOTE — Patient Instructions (Signed)
Your physician recommends that you schedule a follow-up appointment in: one year. We will contact you for that appointment.  90 day supplies of your blood pressure medicine will be called in.

## 2015-11-01 NOTE — Progress Notes (Signed)
OFFICE NOTE  Chief Complaint:  Follow-up hypertension  Primary Care Physician: Laurey Morale, MD  HPI:  Luke Skinner is a pleasant 60 year old male who was kindly referred to me by Dr. Sarajane Jews for evaluation of a labile hypertension and dyspnea. He recently had changes in his blood pressure medicine including the addition of Toprol-XL 50 mg daily. He had reported some improvement in his blood pressures on this however was having problems with diastolic hypotension. His losartan was then decreased from 100 mg daily to 50 mg daily. Blood pressure then ran higher up in the 123456 to 123XX123 systolic. For a few days he took his wife's HCTZ and reported blood pressures in the [id number] to [id number] systolic. He has been having some dizziness and thinks this is related to blood pressure changes. He also reports some worsening fatigue and exercise intolerance, although denies any chest pain. He has difficult sleep at night and often times dozes off during the day. His Epworth sleepiness scale score was 10, suggesting the possibility of sleep apnea. He says that he had a sleep study in 2008 and this was negative for apnea, but did report that he does not get into REM sleep. An EKG in the office today is abnormal demonstrating anterolateral T-wave inversions in sinus bradycardia 55.  I saw Luke Skinner in the office today for follow-up of his stress test. This was negative for ischemia. Notably with the change in his medications including adding back his HCTZ, the blood pressure seems to be better controlled. He is currently on losartan 50 mg (one half tablet and (daily, HCTZ 25 mg daily and Toprol-XL 50 mg which she takes at night. He denies any worsening chest pain or shortness of breath.  Luke Skinner returns today for follow-up. We have further adjusted his blood pressure medicine and currently he is on the Hyzaar 10/25 and Toprol XL 50 mg daily. He reports his blood pressures at home range between 123456 and [id number]  systolic over 0000000. Overall he is feeling very well. Blood pressure today in the office was 130/72.  PMHx:  Past Medical History  Diagnosis Date  . Hyperlipidemia   . Allergy   . Thyroid disease     hypothyroidism  . Arthritis   . Herpes stomatitis   . Depression     per Dr. Caprice Beaver  . Hypertension     Past Surgical History  Procedure Laterality Date  . Colonoscopy  12-09-10    per Dr. Henrene Pastor, repeat in 10 yrs  . Total knee arthroplasty  2008    on left per Dr. Wynelle Link  . Hernia repair      left inguinal   . Nasal septum surgery    . Shoulder arthroscopy  2005    on right per Dr. Wynelle Link  . Esophagus surgery      as infant   . Gastrostomy  1989    per Dr. Annamaria Boots    FAMHx:  Family History  Problem Relation Age of Onset  . Heart disease Mother   . Thyroid disease Mother   . Parkinsonism Father     SOCHx:   reports that he quit smoking about 17 years ago. His smoking use included Cigarettes. He has a 14 pack-year smoking history. He has never used smokeless tobacco. He reports that he does not drink alcohol or use illicit drugs.  ALLERGIES:  Allergies  Allergen Reactions  . Simvastatin     Joint pain.    . Sulfamethoxazole  REACTION: hives    ROS: A comprehensive review of systems was negative.  HOME MEDS: Current Outpatient Prescriptions  Medication Sig Dispense Refill  . acetaminophen (TYLENOL) 325 MG tablet Take 325 mg by mouth every 6 (six) hours as needed for moderate pain or fever.     Marland Kitchen acyclovir (ZOVIRAX) 200 MG capsule Take 1 capsule (200 mg total) by mouth as needed. Fever blisters 180 capsule 3  . amoxicillin (AMOXIL) 500 MG capsule Take 500 mg by mouth as needed. Take prior to dental visit    . Ascorbic Acid (VITAMIN C) 1000 MG tablet Take 1,000 mg by mouth daily.      . diphenhydrAMINE (BENADRYL) 25 MG tablet Take 25 mg by mouth at bedtime as needed for sleep. For sleep.    Marland Kitchen ezetimibe (ZETIA) 10 MG tablet Take 1 tablet (10 mg total) by mouth  daily. 90 tablet 3  . Flaxseed, Linseed, 1000 MG CAPS Take 1,000 each by mouth 2 (two) times daily.     Marland Kitchen HYDROcodone-acetaminophen (NORCO/VICODIN) 5-325 MG per tablet Take 1 tablet by mouth every 6 (six) hours as needed for moderate pain. 20 tablet 0  . HYDROcodone-homatropine (HYDROMET) 5-1.5 MG/5ML syrup Take 5 mLs by mouth every 4 (four) hours as needed. 240 mL 0  . ibuprofen (ADVIL,MOTRIN) 200 MG tablet Take 600 mg by mouth at bedtime.     Marland Kitchen ketoconazole (NIZORAL) 2 % cream Apply 1 application topically 2 (two) times daily. 30 g 5  . levothyroxine (SYNTHROID, LEVOTHROID) 150 MCG tablet Take 1 tablet (150 mcg total) by mouth daily. 90 tablet 3  . losartan-hydrochlorothiazide (HYZAAR) 100-25 MG tablet Take 1 tablet by mouth daily. 90 tablet 3  . metoprolol succinate (TOPROL-XL) 50 MG 24 hr tablet Take 1 tablet (50 mg total) by mouth daily. Take with or immediately following a meal. 90 tablet 3  . mometasone (ELOCON) 0.1 % cream Apply topically as needed. 45 g 11  . Multiple Vitamins-Minerals (ONE-A-DAY MENS 50+ ADVANTAGE PO) Take 1 each by mouth daily.      . naproxen sodium (ANAPROX) 220 MG tablet Take 220 mg by mouth every morning.     . ondansetron (ZOFRAN ODT) 4 MG disintegrating tablet Take 1 tablet (4 mg total) by mouth every 8 (eight) hours as needed for nausea. 10 tablet 0  . POTASSIUM GLUCONATE PO Take 1,190 mg by mouth 4 (four) times a week. Monday, Wednesday, Friday, Sunday    . Sennosides-Docusate Sodium (STOOL SOFTENER LAXATIVE PO) Take by mouth. 3 times weekly- Tuesday, Thursday, Saturday    . sildenafil (VIAGRA) 50 MG tablet Take 0.5 tablets (25 mg total) by mouth daily as needed. 30 tablet 3   No current facility-administered medications for this visit.    LABS/IMAGING: No results found for this or any previous visit (from the past 48 hour(s)). No results found.  WEIGHTS: Wt Readings from Last 3 Encounters:  11/01/15 243 lb 3.2 oz (110.315 kg)  10/31/15 245 lb 8 oz  (111.358 kg)  08/21/15 238 lb 14.4 oz (108.364 kg)    VITALS: BP 130/72 mmHg  Pulse 64  Ht 5\' 9"  (1.753 m)  Wt 243 lb 3.2 oz (110.315 kg)  BMI 35.90 kg/m2  EXAM: Deferred  EKG: Deferred   ASSESSMENT: 1. Fatigue and dyspnea on exertion with abnormal ischemic EKG changes - low risk Myoview 2. Labile hypertension - BP stable on current regimen 3. High risk features for sleep apnea  PLAN: 1.   Luke Skinner has a stable  blood pressure now on his current blood pressure regimen. 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 Plan to see him back annually or sooner as necessary.   Pixie Casino, MD, [facility] Attending Cardiologist Luke Skinner 11/01/2015, 8:38 AM

## 2015-11-05 IMAGING — CT CT ABD-PELV W/O CM
1 series · 15 of 24 positions shown, 19 images · non-contrast
Comparison: None.

CLINICAL DATA: Right flank pain

EXAM:
CT ABDOMEN AND PELVIS WITHOUT CONTRAST
TECHNIQUE: Multidetector CT imaging of the abdomen and pelvis was performed
following the standard protocol without IV contrast.

[Series 6: lung · axial · 0.85mm/px · z∈[+1578,+1683]mm · 15 of 24 slices shown, 19 images]
[im 2/24  soft-tissue]
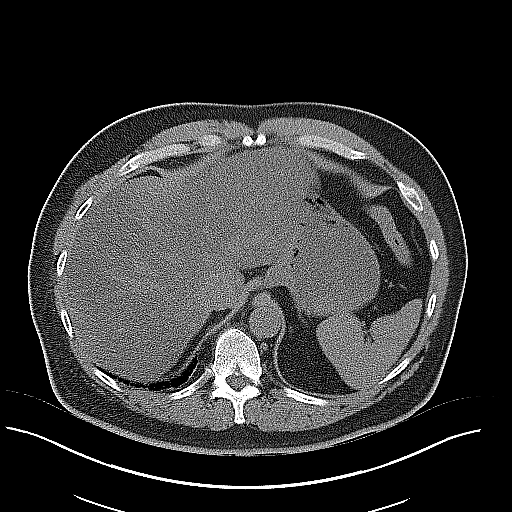
[im 2/24  bone]
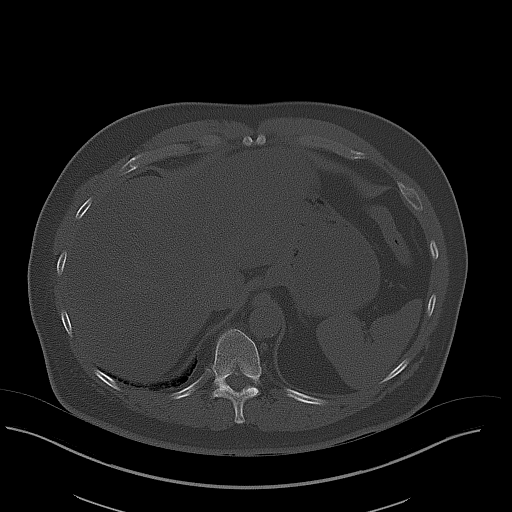
[im 4/24  soft-tissue]
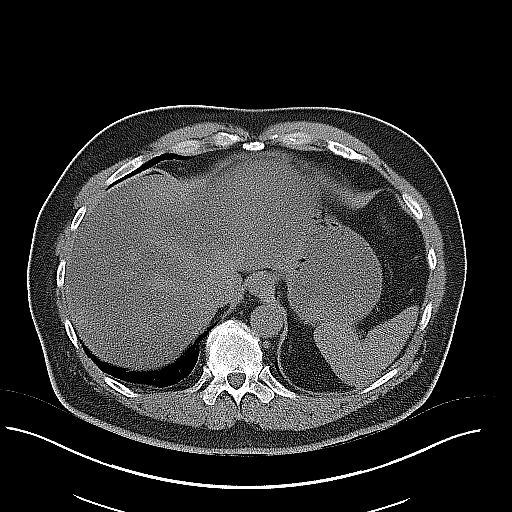
[im 6/24  soft-tissue]
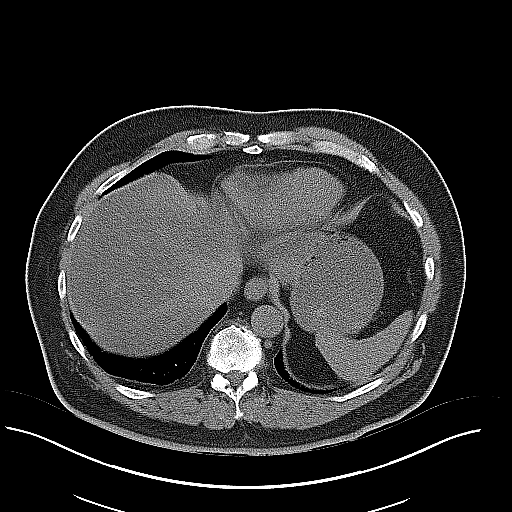
[im 7/24  soft-tissue]
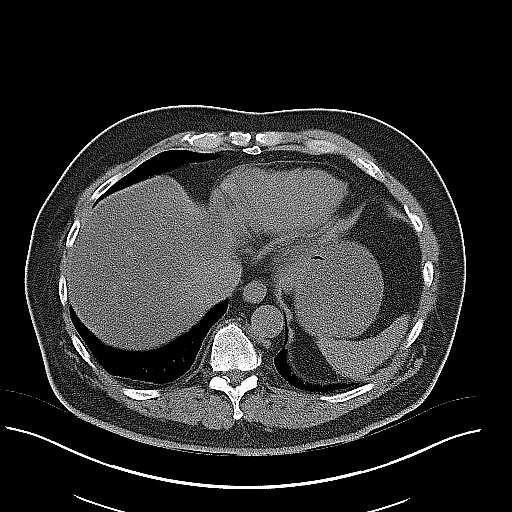
[im 9/24  soft-tissue]
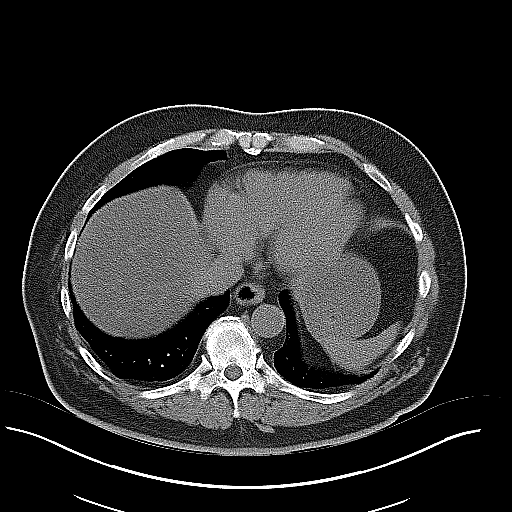
[im 11/24  soft-tissue]
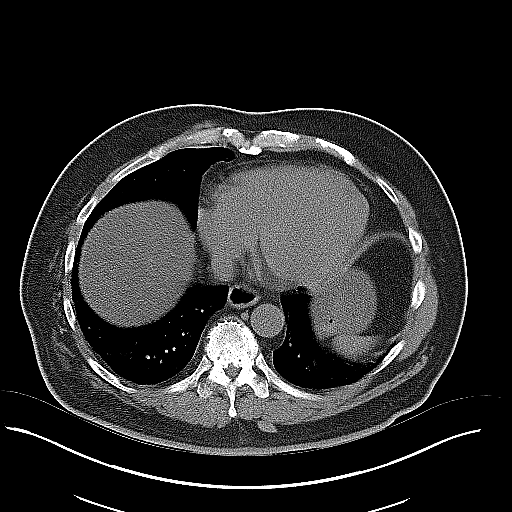
[im 13/24  soft-tissue]
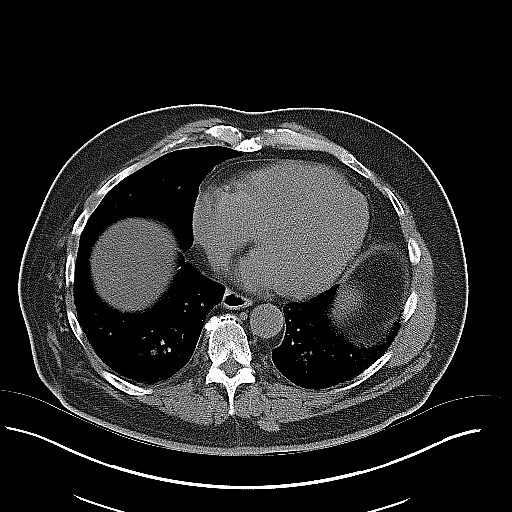
[im 14/24  soft-tissue]
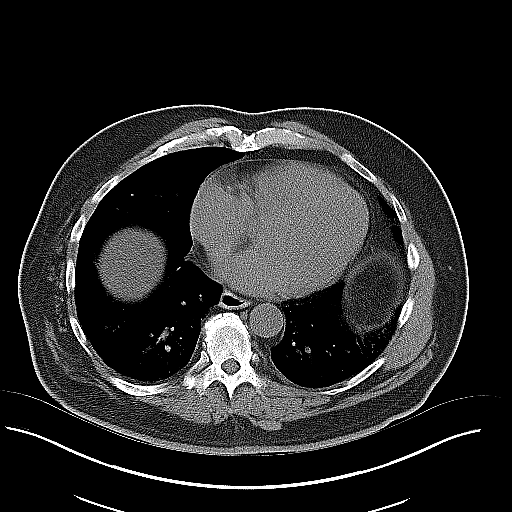
[im 16/24  soft-tissue]
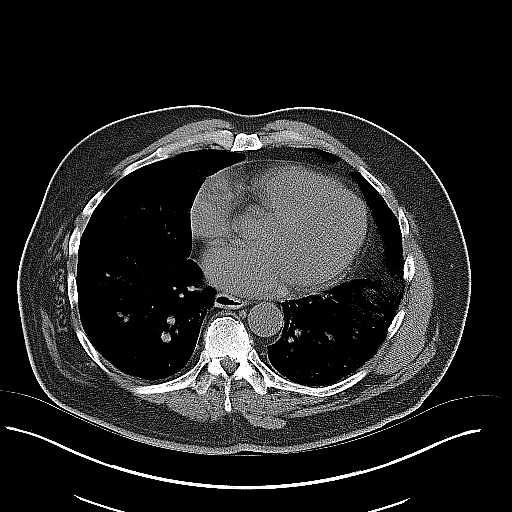
[im 16/24  bone]
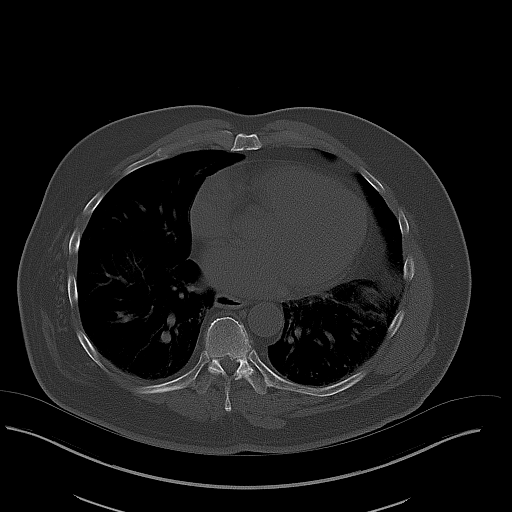
[im 18/24  soft-tissue]
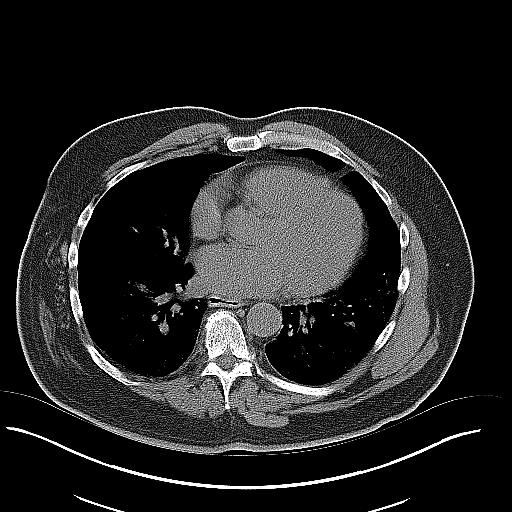
[im 19/24  soft-tissue]
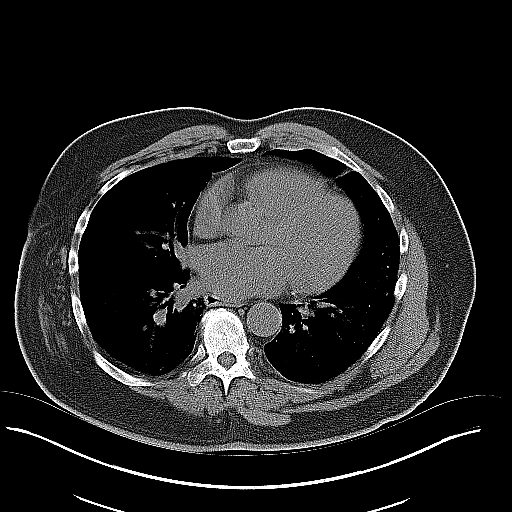
[im 20/24  lung]
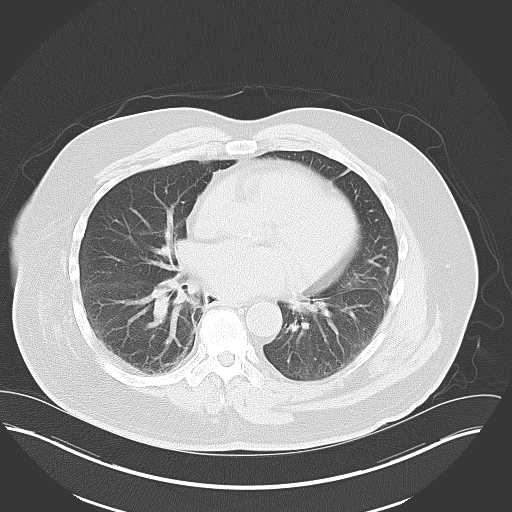
[im 21/24  soft-tissue]
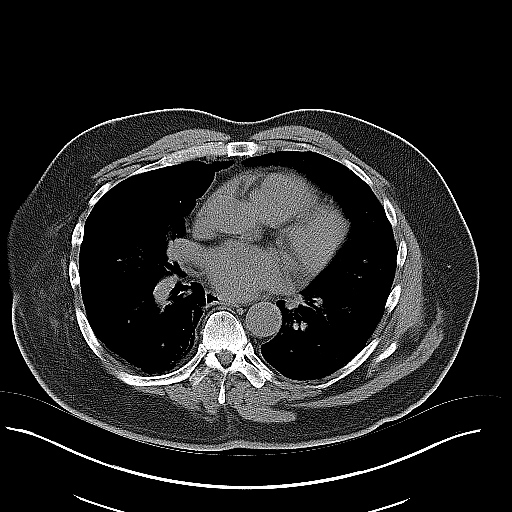
[im 21/24  lung]
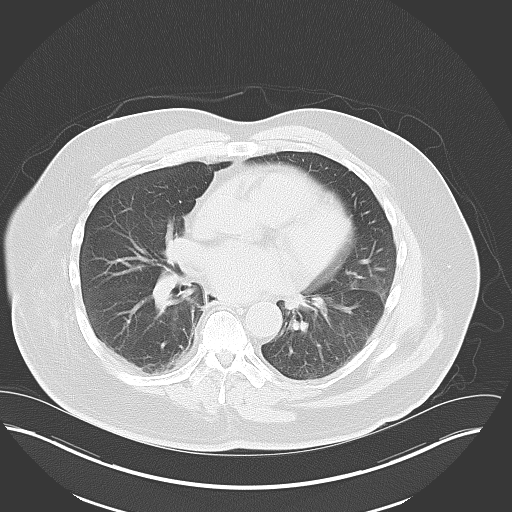
[im 22/24  lung]
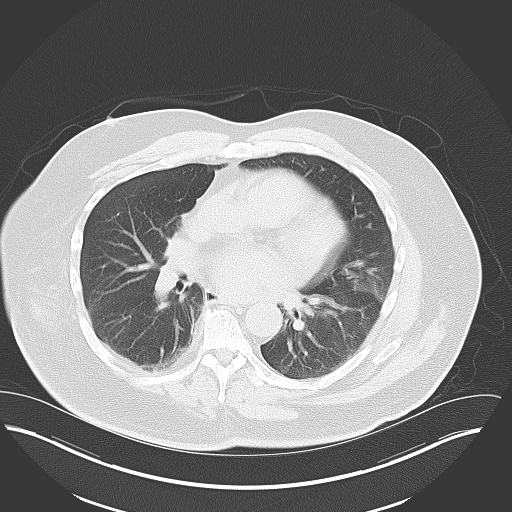
[im 23/24  soft-tissue]
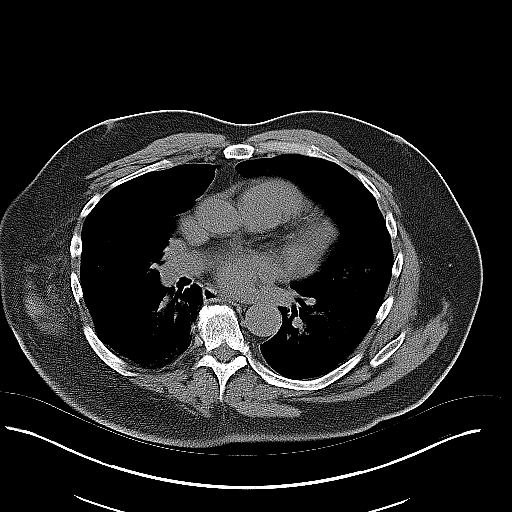
[im 23/24  lung]
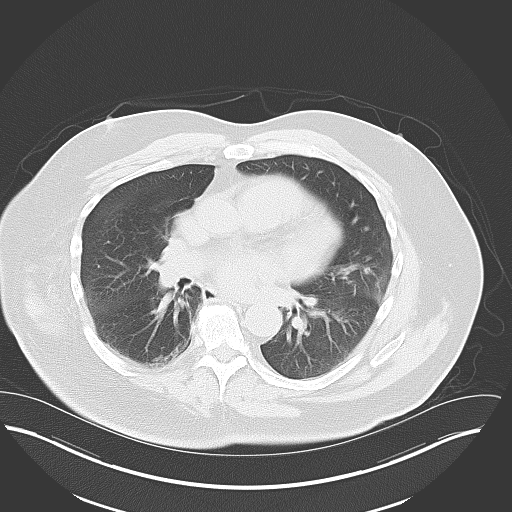

[15 of 24 positions shown; findings below may reference images not displayed]

FINDINGS: Lung bases are unremarkable. Degenerative changes are noted thoracic
and lumbar spine. Significant disc space flattening multilevel lower
lumbar spine.

Atherosclerotic calcifications of abdominal aorta and iliac
arteries. No aortic aneurysm.

There is fatty infiltration of the liver. No calcified gallstones
are noted within gallbladder.

Unenhanced pancreas, spleen and adrenal glands are unremarkable.
There is mild right hydronephrosis and right hydroureter. Mild right
perinephric and periureteral stranding. Nonobstructive calcified
calculus midpole posterior aspect of the right kidney measures
mm.

There is infraumbilical region hernia containing fat without
evidence of acute complication measures 2.6 x 2.5 cm.

Nonobstructive calcified calculus midpole of the left kidney
measures 1 mm. Tiny cyst midpole of the left kidney posterior aspect
measures 9 mm. No proximal or mid ureteral calculi are noted
bilaterally.

In axial image 86 there is calcified obstructive calculus in right
UVJ/urinary bladder wall measures about 3 mm.

No small bowel obstruction. No pericecal inflammation. Normal
retrocecal appendix partially visualized axial image 63. Pelvic
phleboliths are noted. Distal left ureter is unremarkable. Prostate
gland and seminal vesicles are unremarkable. No ascites or free air.
IMPRESSION: 1. There is bilateral nonobstructive nephrolithiasis. Mild right
hydronephrosis and right hydroureter.
2. There is 3 mm calcified calculus in right UVJ/urinary bladder
wall.
3. Normal retrocecal appendix.
4. Small infraumbilical hernia containing fat measures 2.5 x 2.6 cm
without evidence of acute complication.
5. No small bowel obstruction.
6. Degenerative changes thoracolumbar spine.

## 2015-11-12 ENCOUNTER — Ambulatory Visit: Payer: 59 | Admitting: Internal Medicine

## 2015-12-04 ENCOUNTER — Encounter: Payer: Self-pay | Admitting: Family Medicine

## 2015-12-04 ENCOUNTER — Ambulatory Visit (INDEPENDENT_AMBULATORY_CARE_PROVIDER_SITE_OTHER): Payer: 59 | Admitting: Family Medicine

## 2015-12-04 VITALS — BP 113/56 | HR 53 | Temp 97.8°F | Ht 69.0 in | Wt 242.0 lb

## 2015-12-04 DIAGNOSIS — R5383 Other fatigue: Secondary | ICD-10-CM

## 2015-12-04 DIAGNOSIS — E039 Hypothyroidism, unspecified: Secondary | ICD-10-CM

## 2015-12-04 DIAGNOSIS — I10 Essential (primary) hypertension: Secondary | ICD-10-CM | POA: Diagnosis not present

## 2015-12-04 LAB — CBC WITH DIFFERENTIAL/PLATELET
BASOS ABS: 0 10*3/uL (ref 0.0–0.1)
Basophils Relative: 0.4 % (ref 0.0–3.0)
EOS ABS: 0.1 10*3/uL (ref 0.0–0.7)
Eosinophils Relative: 1.7 % (ref 0.0–5.0)
HEMATOCRIT: 40.7 % (ref 39.0–52.0)
HEMOGLOBIN: 13.8 g/dL (ref 13.0–17.0)
LYMPHS PCT: 29.2 % (ref 12.0–46.0)
Lymphs Abs: 2.1 10*3/uL (ref 0.7–4.0)
MCHC: 33.8 g/dL (ref 30.0–36.0)
MCV: 83 fl (ref 78.0–100.0)
MONOS PCT: 9.3 % (ref 3.0–12.0)
Monocytes Absolute: 0.7 10*3/uL (ref 0.1–1.0)
NEUTROS ABS: 4.3 10*3/uL (ref 1.4–7.7)
Neutrophils Relative %: 59.4 % (ref 43.0–77.0)
PLATELETS: 274 10*3/uL (ref 150.0–400.0)
RBC: 4.9 Mil/uL (ref 4.22–5.81)
RDW: 13.6 % (ref 11.5–15.5)
WBC: 7.3 10*3/uL (ref 4.0–10.5)

## 2015-12-04 LAB — LIPID PANEL
CHOLESTEROL: 205 mg/dL — AB (ref 0–200)
HDL: 43 mg/dL (ref >39.00)
LDL CALC: 135 mg/dL — AB (ref 0–99)
NONHDL: 162.37
Total CHOL/HDL Ratio: 5
Triglycerides: 135 mg/dL (ref 0.0–149.0)
VLDL: 27 mg/dL (ref 0.0–40.0)

## 2015-12-04 LAB — TSH: TSH: 4.73 u[IU]/mL — AB (ref 0.35–4.50)

## 2015-12-04 LAB — BASIC METABOLIC PANEL
BUN: 22 mg/dL (ref 6–23)
CALCIUM: 9.9 mg/dL (ref 8.4–10.5)
CO2: 31 meq/L (ref 19–32)
Chloride: 102 mEq/L (ref 96–112)
Creatinine, Ser: 0.97 mg/dL (ref 0.40–1.50)
GFR: 83.93 mL/min (ref >60.00)
Glucose, Bld: 101 mg/dL — ABNORMAL HIGH (ref 70–99)
Potassium: 4.5 mEq/L (ref 3.5–5.1)
SODIUM: 140 meq/L (ref 135–145)

## 2015-12-04 LAB — HEPATIC FUNCTION PANEL
ALBUMIN: 4.4 g/dL (ref 3.5–5.2)
ALT: 50 U/L (ref 0–53)
AST: 28 U/L (ref 0–37)
Alkaline Phosphatase: 66 U/L (ref 39–117)
BILIRUBIN TOTAL: 0.7 mg/dL (ref 0.2–1.2)
Bilirubin, Direct: 0.1 mg/dL (ref 0.0–0.3)
Total Protein: 7.3 g/dL (ref 6.0–8.3)

## 2015-12-04 LAB — T4, FREE: FREE T4: 1 ng/dL (ref 0.60–1.60)

## 2015-12-04 LAB — T3, FREE: T3, Free: 2.8 pg/mL (ref 2.3–4.2)

## 2015-12-04 NOTE — Progress Notes (Signed)
   Subjective:    Patient ID: Luke Skinner, male    DOB: February 04, 1956, 60 y.o.   MRN: [id number]  HPI Here asking for labs to check his thyroid level among other things. He has been feeling a bit out of sorts for several months, and e variously describes this as feeling tired vs feeling lightheaded vs feeling dizzy. Some of what he describes sounds like vertigo and other things sound like generalized fatigue. He has been seeing Dr. Debara Pickett frequently to adjust his HTN meds, and he seems to be at a nice level for this right now. His labs last September were unremarkable, but he thinks something is not quite right. No headaches or SOB or chest pain.    Review of Systems  Constitutional: Positive for fatigue. Negative for activity change, appetite change and unexpected weight change.  HENT: Negative.   Eyes: Negative.   Respiratory: Negative.   Cardiovascular: Negative.   Neurological: Positive for dizziness and light-headedness. Negative for tremors, seizures, syncope, facial asymmetry, speech difficulty, weakness, numbness and headaches.       Objective:   Physical Exam  Constitutional: He is oriented to person, place, and time. He appears well-developed and well-nourished. No distress.  Neck: No thyromegaly present.  Cardiovascular: Normal rate, regular rhythm, normal heart sounds and intact distal pulses.   Pulmonary/Chest: Effort normal and breath sounds normal.  Lymphadenopathy:    He has no cervical adenopathy.  Neurological: He is alert and oriented to person, place, and time. He exhibits normal muscle tone. Coordination normal.          Assessment & Plan:  His BP is well controlled. We will get labs to investigate his symptoms, including a full thyroid panel.

## 2015-12-04 NOTE — Progress Notes (Signed)
Pre visit review using our clinic review tool, if applicable. No additional management support is needed unless otherwise documented below in the visit note. 

## 2015-12-10 NOTE — Addendum Note (Signed)
Addended by: Alysia Penna A on: 12/10/2015 05:47 PM   Modules accepted: Orders

## 2015-12-31 ENCOUNTER — Encounter: Payer: Self-pay | Admitting: Family Medicine

## 2015-12-31 ENCOUNTER — Ambulatory Visit (INDEPENDENT_AMBULATORY_CARE_PROVIDER_SITE_OTHER): Payer: 59 | Admitting: Family Medicine

## 2015-12-31 VITALS — BP 110/60 | Temp 97.7°F | Wt 245.0 lb

## 2015-12-31 DIAGNOSIS — J019 Acute sinusitis, unspecified: Secondary | ICD-10-CM

## 2015-12-31 MED ORDER — HYDROCODONE-HOMATROPINE 5-1.5 MG/5ML PO SYRP: 5.0000 mL | ORAL_SOLUTION | ORAL | Status: DC | PRN

## 2015-12-31 MED ORDER — AZITHROMYCIN 250 MG PO TABS: ORAL_TABLET | ORAL | Status: DC

## 2015-12-31 NOTE — Progress Notes (Signed)
   Subjective:    Patient ID: Luke Skinner, male    DOB: [birth year], 61 y.o.   MRN: [id number]  HPI Here for 3 days of sinus pressure, PND, ST, and coughing up yellow sputum. No fever.    Review of Systems  Constitutional: Negative.   HENT: Positive for congestion and postnasal drip.   Eyes: Negative.   Respiratory: Positive for cough.        Objective:   Physical Exam  Constitutional: He appears well-developed and well-nourished.  HENT:  Right Ear: External ear normal.  Left Ear: External ear normal.  Nose: Nose normal.  Mouth/Throat: Oropharynx is clear and moist.  Eyes: Conjunctivae are normal.  Neck: No thyromegaly present.  Pulmonary/Chest: Effort normal and breath sounds normal.  Lymphadenopathy:    He has no cervical adenopathy.          Assessment & Plan:  Sinusitis, treat with a Zpack.

## 2015-12-31 NOTE — Progress Notes (Signed)
Pre visit review using our clinic review tool, if applicable. No additional management support is needed unless otherwise documented below in the visit note. 

## 2016-01-02 ENCOUNTER — Telehealth: Payer: Self-pay | Admitting: Family Medicine

## 2016-01-02 NOTE — Telephone Encounter (Signed)
[facility]     Patient Name: Luke Skinner Initial Comment Caller states, Tues afternoon, he was seen and given Zpack and cough medicines, he feels worse , bad cough, and has a lot of pain in his chest when he coughs   DOB: Jan 07, 1956      Nurse Assessment  Nurse: Luther Parody, RN, Malachy Mood Date/Time (Eastern Time): 01/02/2016 1:22:55 PM  Confirm and document reason for call. If symptomatic, describe symptoms. You must click the next button to save text entered. ---Caller states that he was seen in the office on Tuesday for a cough and he was prescribed a z pack. Since that time his cough has become severe keeping him up all night last pm. Pt is c/o chest pain with coughing and productive cough of yellow/green sputum. No fever today but was febrile yesterday.  Has the patient traveled out of the country within the last 30 days? ---Not Applicable  Does the patient have any new or worsening symptoms? ---Yes  Will a triage be completed? ---Yes  Related visit to physician within the last 2 weeks? ---Yes  Does the PT have any chronic conditions? (i.e. diabetes, asthma, etc.) ---Yes  List chronic conditions. ---thyroid, htn  Is this a behavioral health or substance abuse call? ---No    Guidelines     Guideline Title Affirmed Question Affirmed Notes   Common Cold Cold with no complications (all triage questions negative)    Final Disposition User   See Physician within 24 Hours Luther Parody, RN, [employer]     Referrals   REFERRED TO PCP OFFICE   Disagree/Comply: Leta Baptist

## 2016-01-02 NOTE — Telephone Encounter (Signed)
Patient has an appt tomorrow with Dr. Sarajane Jews - [facility]

## 2016-01-03 ENCOUNTER — Encounter: Payer: Self-pay | Admitting: Family Medicine

## 2016-01-03 ENCOUNTER — Ambulatory Visit (INDEPENDENT_AMBULATORY_CARE_PROVIDER_SITE_OTHER): Payer: 59 | Admitting: Family Medicine

## 2016-01-03 VITALS — BP 122/62 | HR 73 | Temp 98.4°F | Ht 69.0 in | Wt 245.0 lb

## 2016-01-03 DIAGNOSIS — J209 Acute bronchitis, unspecified: Secondary | ICD-10-CM

## 2016-01-03 MED ORDER — LEVOFLOXACIN 500 MG PO TABS
500.0000 mg | ORAL_TABLET | Freq: Every day | ORAL | Status: AC
Start: 2016-01-03 — End: 2016-01-13

## 2016-01-03 MED ORDER — HYDROCOD POLST-CPM POLST ER 10-8 MG/5ML PO SUER: 5.0000 mL | Freq: Two times a day (BID) | ORAL | Status: DC | PRN

## 2016-01-03 MED ORDER — CEFTRIAXONE SODIUM 1 G IJ SOLR
1.0000 g | Freq: Once | INTRAMUSCULAR | Status: AC
  Administered 2016-01-03: 1 g via INTRAMUSCULAR

## 2016-01-03 NOTE — Addendum Note (Signed)
Addended by: Aggie Hacker A on: 01/03/2016 12:31 PM   Modules accepted: Orders

## 2016-01-03 NOTE — Progress Notes (Signed)
Pre visit review using our clinic review tool, if applicable. No additional management support is needed unless otherwise documented below in the visit note. 

## 2016-01-03 NOTE — Progress Notes (Signed)
   Subjective:    Patient ID: Luke Skinner, male    DOB: [birth year], 60 y.o.   MRN: [id number]  HPI Here for worsening upper respiratory symptoms. He was seen here 2 days ago with an apparent sinus infection and he was started on a Zpack. Yesterday however he developed chest congestion, SOB , and a deep non-productive cough. No fever. Using Hydromet for the cough but this is not working at all. He has coughed so much his chest feels sore.    Review of Systems  Constitutional: Negative.   HENT: Positive for congestion. Negative for ear pain, postnasal drip, sinus pressure and sore throat.   Eyes: Negative.   Respiratory: Positive for cough, chest tightness and wheezing.   Cardiovascular: Positive for chest pain. Negative for palpitations and leg swelling.       Objective:   Physical Exam  Constitutional: He appears well-developed and well-nourished.  HENT:  Right Ear: External ear normal.  Left Ear: External ear normal.  Nose: Nose normal.  Mouth/Throat: Oropharynx is clear and moist.  Eyes: Conjunctivae are normal.  Neck: No thyromegaly present.  Cardiovascular: Normal rate, regular rhythm, normal heart sounds and intact distal pulses.   Pulmonary/Chest: Effort normal. No respiratory distress. He has no rales.  Scattered rhonchi and wheezes  Lymphadenopathy:    He has no cervical adenopathy.          Assessment & Plan:  Bronchitis, given a shot of Rocephin. Switch from the [place] to [place]. Try Tussionex for the cough.

## 2016-01-08 ENCOUNTER — Encounter: Payer: Self-pay | Admitting: Endocrinology

## 2016-01-08 ENCOUNTER — Ambulatory Visit (INDEPENDENT_AMBULATORY_CARE_PROVIDER_SITE_OTHER): Payer: 59 | Admitting: Endocrinology

## 2016-01-08 VITALS — BP 124/72 | HR 57 | Temp 98.4°F | Resp 16 | Ht 69.0 in | Wt 243.4 lb

## 2016-01-08 DIAGNOSIS — E038 Other specified hypothyroidism: Secondary | ICD-10-CM | POA: Diagnosis not present

## 2016-01-08 DIAGNOSIS — E291 Testicular hypofunction: Secondary | ICD-10-CM

## 2016-01-08 DIAGNOSIS — R7301 Impaired fasting glucose: Secondary | ICD-10-CM | POA: Diagnosis not present

## 2016-01-08 DIAGNOSIS — R5383 Other fatigue: Secondary | ICD-10-CM | POA: Diagnosis not present

## 2016-01-08 DIAGNOSIS — E063 Autoimmune thyroiditis: Secondary | ICD-10-CM

## 2016-01-08 DIAGNOSIS — R6882 Decreased libido: Secondary | ICD-10-CM

## 2016-01-08 NOTE — Progress Notes (Signed)
Patient ID: Luke Skinner, male   DOB: 09/05/1956, 60 y.o.   MRN: [id number]            Reason for consultation:  Feeling weak, thyroid problem    History of Present Illness:   Hypothyroidism was first diagnosed  when he was in his 52s.  At the time of diagnosis patient was having symptoms of fatigue, some difficulty with weight loss but does not remember other symptoms  The patient has been treated with  levothyroxine 150 g for most of the duration of his hypothyroidism  With starting thyroid supplementation the patient's symptoms had improved  Over the last few months he thinks he has been feeling more tired and has lack of energy preventing him from doing a lot of things He does not have any significant cold intolerance except occasionally He thinks he has some difficulty losing weight and may have gained some weight recently also  He is taking his thyroid supplement quite regularly in the morning but is also taking his multivitamin which contains 200 mg of calcium at the same time     Patient's weight history is as follows:   Wt Readings from Last 3 Encounters:  01/08/16 243 lb 6.4 oz (110.406 kg)  01/03/16 245 lb (111.131 kg)  12/31/15 245 lb (111.131 kg)    His TSH level has been consistently normal since 2008 except in 2/17  Thyroid function results have been as follows:  Lab Results  Component Value Date   TSH 4.73* 12/04/2015   TSH 2.80 07/05/2015   TSH 2.62 07/10/2014   FREET4 1.00 12/04/2015   FREET4 0.88 07/22/2012    Other problems:  History of HYPOGONADISM:  He thinks he was having significant amount of fatigue, lack of energy in 2011 and because of his low testosterone level he was given AndroGel. He does not know if he felt better with this as he did not take it for long.  He stopped it apparently because his PSA went up to 4.5 He thinks he feels the same way now Also has had long-standing erectile dysfunction of unclear etiology   Lab Results    Component Value Date   TESTOSTERONE 434.17 02/18/2011   TESTOSTERONE 262.09* 01/06/2010   TESTOSTERONE 317.15* 05/03/2009   TESTOSTERONE 381.70 08/02/2007      Past Medical History  Diagnosis Date  . Hyperlipidemia   . Allergy   . Thyroid disease     hypothyroidism  . Arthritis   . Herpes stomatitis   . Depression     per Dr. Caprice Beaver  . Hypertension     Past Surgical History  Procedure Laterality Date  . Colonoscopy  12-09-10    per Dr. Henrene Pastor, repeat in 10 yrs  . Total knee arthroplasty  2008    on left per Dr. Wynelle Link  . Hernia repair      left inguinal   . Nasal septum surgery    . Shoulder arthroscopy  2005    on right per Dr. Wynelle Link  . Esophagus surgery      as infant   . Gastrostomy  1989    per Dr. Annamaria Boots    Family History  Problem Relation Age of Onset  . Heart disease Mother   . Thyroid disease Mother   . Parkinsonism Father   . Thyroid disease Sister   . Diabetes Maternal Uncle     Social History:  reports that he quit smoking about 17 years ago. His smoking use included Cigarettes.  He has a 14 pack-year smoking history. He has never used smokeless tobacco. He reports that he does not drink alcohol or use illicit drugs.  Allergies:  Allergies  Allergen Reactions  . Simvastatin     Joint pain.    . Sulfamethoxazole     REACTION: hives      Medication List       This list is accurate as of: 01/08/16  1:21 PM.  Always use your most recent med list.               acetaminophen 325 MG tablet  Commonly known as:  TYLENOL  Take 325 mg by mouth every 6 (six) hours as needed for moderate pain or fever.     acyclovir 200 MG capsule  Commonly known as:  ZOVIRAX  Take 1 capsule (200 mg total) by mouth as needed. Fever blisters     chlorpheniramine-HYDROcodone 10-8 MG/5ML Suer  Commonly known as:  TUSSIONEX PENNKINETIC ER  Take 5 mLs by mouth every 12 (twelve) hours as needed for cough.     diphenhydrAMINE 25 MG tablet  Commonly known as:   BENADRYL  Take 25 mg by mouth at bedtime as needed for sleep. For sleep.     ezetimibe 10 MG tablet  Commonly known as:  ZETIA  Take 1 tablet (10 mg total) by mouth daily.     Flaxseed (Linseed) 1000 MG Caps  Take 1,000 each by mouth 2 (two) times daily.     ketoconazole 2 % cream  Commonly known as:  NIZORAL  Apply 1 application topically 2 (two) times daily.     levofloxacin 500 MG tablet  Commonly known as:  LEVAQUIN  Take 1 tablet (500 mg total) by mouth daily.     levothyroxine 150 MCG tablet  Commonly known as:  SYNTHROID, LEVOTHROID  Take 1 tablet (150 mcg total) by mouth daily.     losartan-hydrochlorothiazide 100-25 MG tablet  Commonly known as:  HYZAAR  Take 1 tablet by mouth daily.     metoprolol succinate 50 MG 24 hr tablet  Commonly known as:  TOPROL-XL  Take 1 tablet (50 mg total) by mouth daily. Take with or immediately following a meal.     mometasone 0.1 % cream  Commonly known as:  ELOCON  Apply topically as needed.     naproxen sodium 220 MG tablet  Commonly known as:  ANAPROX  Take 220 mg by mouth 2 (two) times daily.     ONE-A-DAY MENS 50+ ADVANTAGE PO  Take 1 each by mouth daily.     POTASSIUM GLUCONATE PO  Take 1,190 mg by mouth 4 (four) times a week. Monday, Wednesday, Friday, Sunday     sildenafil 50 MG tablet  Commonly known as:  VIAGRA  Take 0.5 tablets (25 mg total) by mouth daily as needed.     STOOL SOFTENER LAXATIVE PO  Take by mouth. 3 times weekly- Tuesday, Thursday, Saturday     vitamin C 1000 MG tablet  Take 1,000 mg by mouth daily.        Review of Systems:  Review of Systems  Constitutional: Positive for weight gain.       He has gained 6 pounds in the last 6 months  HENT: Negative for headaches and trouble swallowing.   Eyes: Negative for blurred vision.  Respiratory: Positive for daytime sleepiness.   Cardiovascular: Negative for leg swelling.  Gastrointestinal: Negative for constipation.  Endocrine: Positive  for cold intolerance, heat intolerance  and erectile dysfunction.       He periodically will feel warm and sweaty but not at night.  Occasionally  feels chilled He has had long-standing erectile dysfunction  Genitourinary: Negative for nocturia and slow stream.       He thinks taking Benadryl stops him from getting up to go to the bathroom at night  Musculoskeletal: Negative for joint pain.  Skin: Negative for rash.  Neurological: Negative for numbness and tingling.  Psychiatric/Behavioral: Positive for insomnia.       He takes Benadryl to sleep         Examination:    BP 124/72 mmHg  Pulse 57  Temp(Src) 98.4 F (36.9 C)  Resp 16  Ht 5\' 9"  (1.753 m)  Wt 243 lb 6.4 oz (110.406 kg)  BMI 35.93 kg/m2  SpO2 94%  GENERAL:  Average build.   No pallor, clubbing, lymphadenopathy or edema.  Skin:  no rash or pigmentation.  EYES:  No prominence of the eyes or swelling of the eyelids  ENT: Oral mucosa and tongue normal.  THYROID:  Not palpable.  HEART:  Normal  S1 and S2; no murmur or click.  CHEST:  No gynecomastia present    Lungs: Vescicular breath sounds heard equally.  No crepitations/ wheeze.  ABDOMEN:  No distention.  Liver and spleen not palpable.  No other mass or tenderness.  NEUROLOGICAL: Reflexes are normal bilaterally at biceps and ankles.  JOINTS:  Normal.   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 Currently appears to be having some drug interaction with taking his calcium at the same time as his levothyroxine reducing the absorption   IMPAIRED fasting glucose: His last fasting glucose was 101 and has had blood sugars as high as 176 in the past, no recent A1c available   PLAN:   Check free testosterone level in early morning hours  Also will check LH and prolactin level  Also will check his A1c to rule out early diabetes  Start taking multivitamin either at lunch or dinner instead of with his thyroid supplement.  If his TSH continues to be relatively high will consider increasing his Synthroid dose by at least 12.5 g  Consider either AndroGel or clomiphene if he has significant hypogonadism, will need to monitor his PSA while doing this  Encouraged him to lose weight because of his impaired fasting glucose and likely insulin resistance syndrome  Follow-up in 6 weeks with repeat thyroid levels   Jianni Batten 01/08/2016, 1:21 PM

## 2016-01-10 ENCOUNTER — Other Ambulatory Visit (INDEPENDENT_AMBULATORY_CARE_PROVIDER_SITE_OTHER): Payer: 59

## 2016-01-10 DIAGNOSIS — R7301 Impaired fasting glucose: Secondary | ICD-10-CM | POA: Diagnosis not present

## 2016-01-10 DIAGNOSIS — R6882 Decreased libido: Secondary | ICD-10-CM

## 2016-01-10 DIAGNOSIS — R5383 Other fatigue: Secondary | ICD-10-CM | POA: Diagnosis not present

## 2016-01-10 LAB — LUTEINIZING HORMONE: LH: 4.08 m[IU]/mL (ref 1.50–9.30)

## 2016-01-10 LAB — HEMOGLOBIN A1C: Hgb A1c MFr Bld: 6.2 % (ref 4.6–6.5)

## 2016-01-10 LAB — PROLACTIN: PROLACTIN: 7.3 ng/mL (ref 2.0–18.0)

## 2016-01-12 LAB — TESTOSTERONE, FREE, TOTAL, SHBG
SEX HORMONE BINDING: 64.8 nmol/L (ref 19.3–76.4)
Testosterone, Free: 5.9 pg/mL — ABNORMAL LOW (ref 6.6–18.1)
Testosterone: 500 ng/dL (ref 348–1197)

## 2016-01-14 NOTE — Progress Notes (Signed)
Quick Note:  Please let patient know that the sensitive testosterone level is mildly low related to sluggish pituitary, diabetes test in the prediabetes range. Suggested trial of clomiphene tablets, 50 mg a half tablet 3 times a week to see if he feels better with this, follow-up as scheduled with free and total testosterone from [facility] ______

## 2016-01-15 ENCOUNTER — Other Ambulatory Visit: Payer: Self-pay | Admitting: *Deleted

## 2016-01-15 DIAGNOSIS — R5381 Other malaise: Secondary | ICD-10-CM

## 2016-01-15 DIAGNOSIS — R5383 Other fatigue: Principal | ICD-10-CM

## 2016-01-15 MED ORDER — CLOMIPHENE CITRATE 50 MG PO TABS
50.0000 mg | ORAL_TABLET | Freq: Every day | ORAL | Status: DC
Start: 2016-01-15 — End: 2016-01-16

## 2016-01-16 ENCOUNTER — Telehealth: Payer: Self-pay | Admitting: Endocrinology

## 2016-01-16 NOTE — Telephone Encounter (Signed)
He's suppose to take 1/2 tablet three times a week.

## 2016-01-16 NOTE — Telephone Encounter (Signed)
Pharmacy told him to do somthing totally different from what you told him to do.   Pharmacy told him to take 1 pill a day for 10 days.  You told him to take 1/2 pill for 10 days which is correct.  Please advise

## 2016-02-05 ENCOUNTER — Other Ambulatory Visit: Payer: Self-pay | Admitting: Endocrinology

## 2016-02-05 ENCOUNTER — Other Ambulatory Visit (INDEPENDENT_AMBULATORY_CARE_PROVIDER_SITE_OTHER): Payer: 59

## 2016-02-05 DIAGNOSIS — E039 Hypothyroidism, unspecified: Secondary | ICD-10-CM | POA: Diagnosis not present

## 2016-02-05 DIAGNOSIS — R7301 Impaired fasting glucose: Secondary | ICD-10-CM

## 2016-02-05 DIAGNOSIS — E291 Testicular hypofunction: Secondary | ICD-10-CM

## 2016-02-05 DIAGNOSIS — R5381 Other malaise: Secondary | ICD-10-CM

## 2016-02-05 DIAGNOSIS — R5383 Other fatigue: Principal | ICD-10-CM

## 2016-02-05 LAB — LUTEINIZING HORMONE: LH: 6.51 m[IU]/mL (ref 1.50–9.30)

## 2016-02-05 LAB — GLUCOSE, RANDOM: Glucose, Bld: 101 mg/dL — ABNORMAL HIGH (ref 70–99)

## 2016-02-05 LAB — TSH: TSH: 1.57 u[IU]/mL (ref 0.35–4.50)

## 2016-02-08 LAB — SEX HORMONE BINDING GLOBULIN: Sex Hormone Binding: 81 nmol/L — ABNORMAL HIGH (ref 19.3–76.4)

## 2016-02-08 LAB — TESTOSTERONE, FREE: Testosterone, Free: 15 pg/mL (ref 6.6–18.1)

## 2016-02-10 ENCOUNTER — Ambulatory Visit (INDEPENDENT_AMBULATORY_CARE_PROVIDER_SITE_OTHER): Payer: 59 | Admitting: Endocrinology

## 2016-02-10 ENCOUNTER — Encounter: Payer: Self-pay | Admitting: Endocrinology

## 2016-02-10 VITALS — BP 138/82 | HR 67 | Temp 98.5°F | Resp 14 | Ht 69.0 in | Wt 243.4 lb

## 2016-02-10 DIAGNOSIS — E038 Other specified hypothyroidism: Secondary | ICD-10-CM | POA: Diagnosis not present

## 2016-02-10 DIAGNOSIS — E291 Testicular hypofunction: Secondary | ICD-10-CM | POA: Diagnosis not present

## 2016-02-10 DIAGNOSIS — E063 Autoimmune thyroiditis: Secondary | ICD-10-CM

## 2016-02-10 DIAGNOSIS — R7301 Impaired fasting glucose: Secondary | ICD-10-CM

## 2016-02-10 NOTE — Patient Instructions (Signed)
Take Mon and [employer]

## 2016-02-10 NOTE — Progress Notes (Signed)
Patient ID: Luke Skinner, male   DOB: 11/17/1955, 60 y.o.   MRN: [id number]            Reason for visit: Follow-up of testosterone and thyroid    History of Present Illness:   Hypothyroidism was first diagnosed  when he was in his 69s. At the time of diagnosis patient was having symptoms of fatigue, difficulty with weight loss but does not remember other symptoms  The patient has been treated with  levothyroxine 150 g for most of the duration of his hypothyroidism With starting thyroid supplementation the patient's symptoms had improved  He is now taking his thyroid supplement quite regularly in the morning and takes his multivitamin later in the day He does not feel quite as tired but also is starting treatment for his hypogonadism     Patient's weight history is as follows:   Wt Readings from Last 3 Encounters:  02/10/16 243 lb 6.4 oz (110.406 kg)  01/08/16 243 lb 6.4 oz (110.406 kg)  01/03/16 245 lb (111.131 kg)    His TSH level has been consistently normal since 2008 except in 2/17, now back to normal  Thyroid function results have been as follows:  Lab Results  Component Value Date   TSH 1.57 02/05/2016   TSH 4.73* 12/04/2015   TSH 2.80 07/05/2015   FREET4 1.00 12/04/2015   FREET4 0.88 07/22/2012    Other problems:  History of HYPOGONADISM:  He was having significant amount of fatigue, lack of energy in 2011 and because of his low testosterone level he was given AndroGel. He does not know if he felt better with this as he did not take it for long.  He stopped it apparently because his PSA went up to 4.5  He was seen in consultation because of symptoms for a few months of feeling more tired and has lack of energy preventing him from doing a lot of things He thinks he has some difficulty losing weight and may have gained some weight recently also Also has had long-standing erectile dysfunction of unclear etiology   Although his total testosterone was normal  his free testosterone was no at 5.6 Since his LH and prolactin level and baseline were in the normal range he was started on a trial of clomiphene half tablet a times a week of 50 mg  He says he feels better with his energy level although generally this is only a day or so after he takes his clomiphene dose; started feeling better about a week after starting this. However he thinks his libido is not any better and only time this was better was when he took prednisone previously  Free testosterone level now is quite normal at 15  Lab Results  Component Value Date   TESTOSTERONE 500 01/10/2016   TESTOSTERONE 434.17 02/18/2011   TESTOSTERONE 262.09* 01/06/2010   TESTOSTERONE 317.15* 05/03/2009    Lab on 02/05/2016  Component Date Value Ref Range Status  . Testosterone, Free 02/05/2016 15.0  6.6 - 18.1 pg/mL Final  . Sex Hormone Binding 02/05/2016 81.0* 19.3 - 76.4 nmol/L Final  . TSH 02/05/2016 1.57  0.35 - 4.50 uIU/mL Final  . Glucose, Bld 02/05/2016 101* 70 - 99 mg/dL Final  . LH 02/05/2016 6.51  1.50 - 9.30 mIU/mL Final   Comment: Male Reference Range:20-70 yrs     1.5-9.3 mIU/mL>70 yrs       3.1-35.6 mIU/mLFemale Reference Range:Follicular Phase     [id number] mIU/mLMidcycle  8.7-76.3 mIU/mLLuteal Phase         0.5-16.9 mIU/mL  Post Menopausal      15.9-54.0  mIU/mLPregnant             <1.5 mIU/mLContraceptives       0.7-5.6 mIU/mL       Past Medical History  Diagnosis Date  . Hyperlipidemia   . Allergy   . Thyroid disease     hypothyroidism  . Arthritis   . Herpes stomatitis   . Depression     per Dr. Caprice Beaver  . Hypertension     Past Surgical History  Procedure Laterality Date  . Colonoscopy  12-09-10    per Dr. Henrene Pastor, repeat in 10 yrs  . Total knee arthroplasty  2008    on left per Dr. Wynelle Link  . Hernia repair      left inguinal   . Nasal septum surgery    . Shoulder arthroscopy  2005    on right per Dr. Wynelle Link  . Esophagus surgery      as infant     . Gastrostomy  1989    per Dr. Annamaria Boots    Family History  Problem Relation Age of Onset  . Heart disease Mother   . Thyroid disease Mother   . Parkinsonism Father   . Thyroid disease Sister   . Diabetes Maternal Uncle     Social History:  reports that he quit smoking about 17 years ago. His smoking use included Cigarettes. He has a 14 pack-year smoking history. He has never used smokeless tobacco. He reports that he does not drink alcohol or use illicit drugs.  Allergies:  Allergies  Allergen Reactions  . Simvastatin     Joint pain.    . Sulfamethoxazole     REACTION: hives      Medication List       This list is accurate as of: 02/10/16 12:43 PM.  Always use your most recent med list.               acetaminophen 325 MG tablet  Commonly known as:  TYLENOL  Take 325 mg by mouth every 6 (six) hours as needed for moderate pain or fever.     acyclovir 200 MG capsule  Commonly known as:  ZOVIRAX  Take 1 capsule (200 mg total) by mouth as needed. Fever blisters     chlorpheniramine-HYDROcodone 10-8 MG/5ML Suer  Commonly known as:  TUSSIONEX PENNKINETIC ER  Take 5 mLs by mouth every 12 (twelve) hours as needed for cough.     clomiPHENE 50 MG tablet  Commonly known as:  CLOMID  Take 50 mg by mouth. Take 1/2 tablet three times a week.     diphenhydrAMINE 25 MG tablet  Commonly known as:  BENADRYL  Take 25 mg by mouth at bedtime as needed for sleep. For sleep.     ezetimibe 10 MG tablet  Commonly known as:  ZETIA  Take 1 tablet (10 mg total) by mouth daily.     Flaxseed (Linseed) 1000 MG Caps  Take 1,000 each by mouth 2 (two) times daily.     ketoconazole 2 % cream  Commonly known as:  NIZORAL  Apply 1 application topically 2 (two) times daily.     levothyroxine 150 MCG tablet  Commonly known as:  SYNTHROID, LEVOTHROID  Take 1 tablet (150 mcg total) by mouth daily.     losartan-hydrochlorothiazide 100-25 MG tablet  Commonly known as:  HYZAAR  Take 1 tablet  by mouth daily.     metoprolol succinate 50 MG 24 hr tablet  Commonly known as:  TOPROL-XL  Take 1 tablet (50 mg total) by mouth daily. Take with or immediately following a meal.     mometasone 0.1 % cream  Commonly known as:  ELOCON  Apply topically as needed.     naproxen sodium 220 MG tablet  Commonly known as:  ANAPROX  Take 220 mg by mouth 2 (two) times daily.     ONE-A-DAY MENS 50+ ADVANTAGE PO  Take 1 each by mouth daily.     POTASSIUM GLUCONATE PO  Take 1,190 mg by mouth 4 (four) times a week. Monday, Wednesday, Friday, Sunday     sildenafil 50 MG tablet  Commonly known as:  VIAGRA  Take 0.5 tablets (25 mg total) by mouth daily as needed.     STOOL SOFTENER LAXATIVE PO  Take by mouth. 3 times weekly- Tuesday, Thursday, Saturday     vitamin C 1000 MG tablet  Take 1,000 mg by mouth daily.         Review of Systems      His fasting glucose was 101, A1c is upper normal also He says he is trying to start being more active especially with weather improving and his feeling less tired  Lab Results  Component Value Date   HGBA1C 6.2 01/10/2016   HGBA1C 5.7 12/08/2006   Lab Results  Component Value Date   LDLCALC 135* 12/04/2015   CREATININE 0.97 12/04/2015     Examination:    BP 138/82 mmHg  Pulse 67  Temp(Src) 98.5 F (36.9 C)  Resp 14  Ht 5\' 9"  (1.753 m)  Wt 243 lb 6.4 oz (110.406 kg)  BMI 35.93 kg/m2  SpO2 97%     Assessment:  HYPOGONADOTROPIC hypogonadism   He does have nonspecific fatigue and decreased libido, now on a trial of clomiphene 25 mg 3 times a week He is subjectively feeling less fatigued although still having decreased libido  His baseline free testosterone was below normal and now it is upper normal at 15  HYPOTHYROIDISM, long-standing and secondary to autoimmune disease with strong family History  His TSH is quite normal now with his using his multivitamin separate from the thyroid supplement   IMPAIRED fasting glucose:  His last fasting glucose was 101 and emphasized the need for weight loss and physical exercise  Decreased libido: He will discuss his antihypertensive drugs with cardiologist, not clear if this is related to medications or other factors  PLAN:   Reduce clomiphene to twice a week on Mondays and Thursdays since his testosterone level is high normal and he has a history of rising PSA  Weight loss  Continue same dose of 150 g for Synthroid  Follow-up in 12 weeks with repeat morning testosterone and glucose as well as PSA levels   Rumi Taras 02/10/2016, 12:43 PM

## 2016-04-01 ENCOUNTER — Ambulatory Visit (INDEPENDENT_AMBULATORY_CARE_PROVIDER_SITE_OTHER): Payer: 59 | Admitting: Family Medicine

## 2016-04-01 ENCOUNTER — Encounter: Payer: Self-pay | Admitting: Family Medicine

## 2016-04-01 VITALS — BP 107/57 | HR 65 | Temp 98.3°F | Ht 69.0 in | Wt 245.0 lb

## 2016-04-01 DIAGNOSIS — J019 Acute sinusitis, unspecified: Secondary | ICD-10-CM

## 2016-04-01 MED ORDER — LEVOFLOXACIN 500 MG PO TABS: 500.0000 mg | ORAL_TABLET | Freq: Every day | ORAL | Status: AC

## 2016-04-01 MED ORDER — HYDROCODONE-HOMATROPINE 5-1.5 MG/5ML PO SYRP: 5.0000 mL | ORAL_SOLUTION | ORAL | Status: DC | PRN

## 2016-04-01 NOTE — Progress Notes (Signed)
   Subjective:    Patient ID: Naquan Shrestha Vandevender, male    DOB: September 11, 1956, 60 y.o.   MRN: [id number]  HPI Here for 3 days of sinus pressure, PND, and coughing up green sputum. On Mucinex.    Review of Systems  Constitutional: Negative.   HENT: Positive for congestion, postnasal drip and sinus pressure. Negative for sore throat.   Eyes: Negative.   Respiratory: Positive for cough.        Objective:   Physical Exam  Constitutional: He appears well-developed and well-nourished.  HENT:  Right Ear: External ear normal.  Left Ear: External ear normal.  Nose: Nose normal.  Mouth/Throat: Oropharynx is clear and moist.  Eyes: Conjunctivae are normal.  Neck: No thyromegaly present.  Pulmonary/Chest: Effort normal and breath sounds normal.  Lymphadenopathy:    He has no cervical adenopathy.          Assessment & Plan:  Sinusitis, treat with Levaquin.  Laurey Morale, MD

## 2016-04-01 NOTE — Progress Notes (Signed)
Pre visit review using our clinic review tool, if applicable. No additional management support is needed unless otherwise documented below in the visit note. 

## 2016-05-07 ENCOUNTER — Other Ambulatory Visit (INDEPENDENT_AMBULATORY_CARE_PROVIDER_SITE_OTHER): Payer: 59

## 2016-05-07 ENCOUNTER — Other Ambulatory Visit: Payer: Self-pay | Admitting: Endocrinology

## 2016-05-07 DIAGNOSIS — R972 Elevated prostate specific antigen [PSA]: Secondary | ICD-10-CM

## 2016-05-07 DIAGNOSIS — E038 Other specified hypothyroidism: Secondary | ICD-10-CM

## 2016-05-07 DIAGNOSIS — E291 Testicular hypofunction: Secondary | ICD-10-CM

## 2016-05-07 DIAGNOSIS — R7301 Impaired fasting glucose: Secondary | ICD-10-CM

## 2016-05-07 DIAGNOSIS — E063 Autoimmune thyroiditis: Secondary | ICD-10-CM

## 2016-05-07 LAB — TSH: TSH: 3.18 u[IU]/mL (ref 0.35–4.50)

## 2016-05-07 LAB — HEMOGLOBIN A1C: Hgb A1c MFr Bld: 6.2 % (ref 4.6–6.5)

## 2016-05-07 LAB — T4, FREE: FREE T4: 1.05 ng/dL (ref 0.60–1.60)

## 2016-05-07 LAB — PSA: PSA: 1.72 ng/mL (ref 0.10–4.00)

## 2016-05-07 LAB — GLUCOSE, RANDOM: GLUCOSE: 99 mg/dL (ref 70–99)

## 2016-05-08 LAB — TESTOSTERONE, FREE, TOTAL, SHBG
SEX HORMONE BINDING: 52.2 nmol/L (ref 19.3–76.4)
Testosterone, Free: 7.9 pg/mL (ref 6.6–18.1)
Testosterone: 428 ng/dL (ref 264–916)

## 2016-05-14 ENCOUNTER — Ambulatory Visit (INDEPENDENT_AMBULATORY_CARE_PROVIDER_SITE_OTHER): Payer: 59 | Admitting: Endocrinology

## 2016-05-14 ENCOUNTER — Encounter: Payer: Self-pay | Admitting: Endocrinology

## 2016-05-14 VITALS — BP 122/72 | HR 57 | Wt 247.0 lb

## 2016-05-14 DIAGNOSIS — E291 Testicular hypofunction: Secondary | ICD-10-CM | POA: Diagnosis not present

## 2016-05-14 DIAGNOSIS — E038 Other specified hypothyroidism: Secondary | ICD-10-CM

## 2016-05-14 DIAGNOSIS — E063 Autoimmune thyroiditis: Secondary | ICD-10-CM

## 2016-05-14 MED ORDER — CLOMIPHENE CITRATE 50 MG PO TABS: 25.0000 mg | ORAL_TABLET | ORAL | 1 refills | Status: DC

## 2016-05-14 NOTE — Progress Notes (Signed)
Patient ID: Luke Skinner, male   DOB: [birth year], 60 y.o.   MRN: [id number]            Reason for visit: Follow-up of testosterone and thyroid    History of Present Illness:   Hypothyroidism was first diagnosed  when he was in his 68s. At the time of diagnosis patient was having symptoms of fatigue, difficulty with weight loss but does not remember other symptoms  The patient has been treated with  levothyroxine 150 g for most of the duration of his hypothyroidism With starting thyroid supplementation the patient's symptoms had improved  He is taking his thyroid supplement quite regularly in the morning and takes his multivitamin later in the day He does not feel  Any unusual fatigue lately     Patient's weight history is as follows:   Wt Readings from Last 3 Encounters:  05/14/16 247 lb (112 kg)  04/01/16 245 lb (111.1 kg)  02/10/16 243 lb 6.4 oz (110.4 kg)    His TSH level has been consistently normal   Thyroid function results have been as follows:  Lab Results  Component Value Date   TSH 3.18 05/07/2016   TSH 1.57 02/05/2016   TSH 4.73 (H) 12/04/2015   FREET4 1.05 05/07/2016   FREET4 1.00 12/04/2015   FREET4 0.88 07/22/2012    Other problems:  History of HYPOGONADISM:  He was having significant amount of fatigue, lack of energy in 2011 and because of his low testosterone level he was given AndroGel. He does not know if he felt better with this as he did not take it for long.  He stopped it apparently because his PSA went up to 4.5  He was seen in consultation because of symptoms for a few months of feeling more tired and has lack of energy preventing him from doing a lot of things He thinks he has some difficulty losing weight and may have gained some weight recently also Also has had long-standing erectile dysfunction of unclear etiology  Although his total testosterone was normal his free testosterone was  Relatively low at 5.6 Since his LH and prolactin  level and baseline were in the normal range he was started on a trial of clomiphene half tablet 3 times a week of 50 mg  He says he feels better with his energy level  Again overall feels fairly good  He is not concerned about his libido or ED because of not being sexually active currently    He has gained a little weight because of hip pain and not being active  Free testosterone level  Has come back to normal with treatment and now his total testosterone is also consistently normal  Since his testosterone was on the higher side of the range his clomiphene was reduced down to twice a week in 4/17  This is now quite normal at 428   Lab Results  Component Value Date   TESTOSTERONE 428 05/07/2016   TESTOSTERONE 500 01/10/2016   TESTOSTERONE 434.17 02/18/2011   TESTOSTERONE 262.09 (L) 01/06/2010    No visits with results within 1 Week(s) from this visit.  Latest known visit with results is:  Lab on 05/07/2016  Component Date Value Ref Range Status  . Hgb A1c MFr Bld 05/07/2016 6.2  4.6 - 6.5 % Final  . Free T4 05/07/2016 1.05  0.60 - 1.60 ng/dL Final  . TSH 05/07/2016 3.18  0.35 - 4.50 uIU/mL Final  . Testosterone 05/08/2016 428  264 -  916 ng/dL Final   Comment: **Please note reference interval change** Adult male reference interval is based on a population of healthy nonobese males (BMI <30) between 29 and 74 years old. [place], [place] [phone]. PMID: [id number].   Marland Kitchen Testosterone, Free 05/08/2016 7.9  6.6 - 18.1 pg/mL Final  . Sex Hormone Binding 05/08/2016 52.2  19.3 - 76.4 nmol/L Final  . Glucose, Bld 05/07/2016 99  70 - 99 mg/dL Final  . PSA 05/07/2016 1.72  0.10 - 4.00 ng/mL Final      Past Medical History:  Diagnosis Date  . Allergy   . Arthritis   . Depression    per Dr. Caprice Beaver  . Herpes stomatitis   . Hyperlipidemia   . Hypertension   . Thyroid disease    hypothyroidism    Past Surgical History:  Procedure Laterality Date  . COLONOSCOPY   12-09-10   per Dr. Henrene Pastor, repeat in 10 yrs  . ESOPHAGUS SURGERY     as infant   . GASTROSTOMY  1989   per Dr. Annamaria Boots  . HERNIA REPAIR     left inguinal   . NASAL SEPTUM SURGERY    . SHOULDER ARTHROSCOPY  2005   on right per Dr. Wynelle Link  . TOTAL KNEE ARTHROPLASTY  2008   on left per Dr. Wynelle Link    Family History  Problem Relation Age of Onset  . Heart disease Mother   . Thyroid disease Mother   . Parkinsonism Father   . Thyroid disease Sister   . Diabetes Maternal Uncle     Social History:  reports that he quit smoking about 17 years ago. His smoking use included Cigarettes. He has a 14.00 pack-year smoking history. He has never used smokeless tobacco. He reports that he does not drink alcohol or use drugs.  Allergies:  Allergies  Allergen Reactions  . Simvastatin     Joint pain.    . Sulfamethoxazole     REACTION: hives      Medication List       Accurate as of 05/14/16  8:33 AM. Always use your most recent med list.          acetaminophen 325 MG tablet Commonly known as:  TYLENOL Take 325 mg by mouth every 6 (six) hours as needed for moderate pain or fever.   acyclovir 200 MG capsule Commonly known as:  ZOVIRAX Take 1 capsule (200 mg total) by mouth as needed. Fever blisters   clomiPHENE 50 MG tablet Commonly known as:  CLOMID Take 25 mg by mouth 2 (two) times a week.   diphenhydrAMINE 25 MG tablet Commonly known as:  BENADRYL Take 25 mg by mouth at bedtime as needed for sleep. For sleep.   ezetimibe 10 MG tablet Commonly known as:  ZETIA Take 1 tablet (10 mg total) by mouth daily.   Flaxseed (Linseed) 1000 MG Caps Take 1,000 each by mouth 2 (two) times daily.   HYDROcodone-homatropine 5-1.5 MG/5ML syrup Commonly known as:  HYDROMET Take 5 mLs by mouth every 4 (four) hours as needed.   ketoconazole 2 % cream Commonly known as:  NIZORAL Apply 1 application topically 2 (two) times daily.   levothyroxine 150 MCG tablet Commonly known as:   SYNTHROID, LEVOTHROID Take 1 tablet (150 mcg total) by mouth daily.   losartan-hydrochlorothiazide 100-25 MG tablet Commonly known as:  HYZAAR Take 1 tablet by mouth daily.   metoprolol succinate 50 MG 24 hr tablet Commonly known as:  TOPROL-XL Take 1 tablet (  50 mg total) by mouth daily. Take with or immediately following a meal.   mometasone 0.1 % cream Commonly known as:  ELOCON Apply topically as needed.   naproxen sodium 220 MG tablet Commonly known as:  ANAPROX Take 220 mg by mouth 2 (two) times daily.   ONE-A-DAY MENS 50+ ADVANTAGE PO Take 1 each by mouth daily.   POTASSIUM GLUCONATE PO Take 1,190 mg by mouth 4 (four) times a week. Monday, Wednesday, Friday, Sunday   sildenafil 50 MG tablet Commonly known as:  VIAGRA Take 0.5 tablets (25 mg total) by mouth daily as needed.   STOOL SOFTENER LAXATIVE PO Take by mouth. 3 times weekly- Tuesday, Thursday, Saturday   vitamin C 1000 MG tablet Take 1,000 mg by mouth daily.        Review of Systems      His fasting glucose was 101, A1c is upper normal also He says he is trying to start being more active especially with weather improving and his feeling less tired  Lab Results  Component Value Date   HGBA1C 6.2 05/07/2016   HGBA1C 6.2 01/10/2016   HGBA1C 5.7 12/08/2006   Lab Results  Component Value Date   LDLCALC 135 (H) 12/04/2015   CREATININE 0.97 12/04/2015     Examination:    BP 122/72 (BP Location: Left Arm, Patient Position: Sitting)   Pulse (!) 57   Wt 247 lb (112 kg)   SpO2 95%   BMI 36.48 kg/m      Assessment:  HYPOGONADOTROPIC hypogonadism   He does have nonspecific fatigue and decreased libido, now on a trial of clomiphene 25 mg 2 times a week He is subjectively feeling less fatigued  His testosterone level has been normal with clomiphene needs to be continued unchanged PSA is stable  HYPOTHYROIDISM, long-standing and secondary to autoimmune disease with strong family  History  His TSH is quite normal now with his using his multivitamin separate from the thyroid supplement  IMPAIRED fasting glucose: A1c is stable at 6.2, needs weight loss.  Fasting glucose 99  PLAN:   No change in clomiphene  Follow-up in 6 months with repeat testosterone  Weight loss, he thinks he will do better after his hip surgery  Continue same dose of 150 g for Synthroid    Dhruti Ghuman 05/14/2016, 8:33 AM

## 2016-05-22 ENCOUNTER — Ambulatory Visit (INDEPENDENT_AMBULATORY_CARE_PROVIDER_SITE_OTHER): Payer: 59 | Admitting: Family Medicine

## 2016-05-22 ENCOUNTER — Encounter: Payer: Self-pay | Admitting: Family Medicine

## 2016-05-22 VITALS — BP 127/67 | HR 66 | Temp 98.0°F | Ht 69.0 in | Wt 244.0 lb

## 2016-05-22 DIAGNOSIS — J189 Pneumonia, unspecified organism: Secondary | ICD-10-CM | POA: Diagnosis not present

## 2016-05-22 MED ORDER — DOXYCYCLINE HYCLATE 100 MG PO CAPS: 100.0000 mg | ORAL_CAPSULE | Freq: Two times a day (BID) | ORAL | 0 refills | Status: AC

## 2016-05-22 MED ORDER — HYDROCODONE-HOMATROPINE 5-1.5 MG/5ML PO SYRP: 5.0000 mL | ORAL_SOLUTION | ORAL | 0 refills | Status: DC | PRN

## 2016-05-22 NOTE — Progress Notes (Signed)
Pre visit review using our clinic review tool, if applicable. No additional management support is needed unless otherwise documented below in the visit note. 

## 2016-05-22 NOTE — Progress Notes (Signed)
   Subjective:    Patient ID: Luke Skinner, male    DOB: [birth year], 60 y.o.   MRN: [id number]  HPI Here to recheck a pneumonia. He saw a [facility] on 05-16-16 for coughing up brown sputum and they heard rales in the LLL on exam. He was diagnosed with "walking pneumonia" and was given a 7 day course of Doxycycline. Today he feels much better but still has an occasional dry cough. No fever.    Review of Systems  Constitutional: Negative.   HENT: Negative.   Eyes: Negative.   Respiratory: Positive for cough. Negative for chest tightness, shortness of breath and wheezing.   Cardiovascular: Negative.        Objective:   Physical Exam  Constitutional: He is oriented to person, place, and time. He appears well-developed and well-nourished. No distress.  HENT:  Right Ear: External ear normal.  Left Ear: External ear normal.  Nose: Nose normal.  Mouth/Throat: Oropharynx is clear and moist.  Eyes: Conjunctivae are normal.  Neck: No thyromegaly present.  Cardiovascular: Normal rate, regular rhythm, normal heart sounds and intact distal pulses.   Pulmonary/Chest: Effort normal and breath sounds normal. No respiratory distress. He has no wheezes. He has no rales.  Lymphadenopathy:    He has no cervical adenopathy.  Neurological: He is alert and oriented to person, place, and time.          Assessment & Plan:  Resolving pneumonia. He seems to be recovering well. We will give him 3 more days of Doxycycline to complete a 10 day course. Recheck prn.  Laurey Morale, MD

## 2016-07-12 ENCOUNTER — Other Ambulatory Visit: Payer: Self-pay | Admitting: Family Medicine

## 2016-07-16 ENCOUNTER — Other Ambulatory Visit: Payer: 59

## 2016-07-22 ENCOUNTER — Encounter: Payer: 59 | Admitting: Family Medicine

## 2016-07-22 ENCOUNTER — Other Ambulatory Visit (INDEPENDENT_AMBULATORY_CARE_PROVIDER_SITE_OTHER): Payer: 59

## 2016-07-22 DIAGNOSIS — Z0000 Encounter for general adult medical examination without abnormal findings: Secondary | ICD-10-CM

## 2016-07-22 LAB — CBC WITH DIFFERENTIAL/PLATELET
Basophils Absolute: 0 10*3/uL (ref 0.0–0.1)
Basophils Relative: 0.2 % (ref 0.0–3.0)
EOS PCT: 2 % (ref 0.0–5.0)
Eosinophils Absolute: 0.1 10*3/uL (ref 0.0–0.7)
HCT: 41.8 % (ref 39.0–52.0)
HEMOGLOBIN: 14.1 g/dL (ref 13.0–17.0)
LYMPHS PCT: 33.2 % (ref 12.0–46.0)
Lymphs Abs: 2.1 10*3/uL (ref 0.7–4.0)
MCHC: 33.8 g/dL (ref 30.0–36.0)
MCV: 83.1 fl (ref 78.0–100.0)
MONOS PCT: 10.5 % (ref 3.0–12.0)
Monocytes Absolute: 0.6 10*3/uL (ref 0.1–1.0)
Neutro Abs: 3.3 10*3/uL (ref 1.4–7.7)
Neutrophils Relative %: 54.1 % (ref 43.0–77.0)
Platelets: 247 10*3/uL (ref 150.0–400.0)
RBC: 5.03 Mil/uL (ref 4.22–5.81)
RDW: 14.5 % (ref 11.5–15.5)
WBC: 6.2 10*3/uL (ref 4.0–10.5)

## 2016-07-22 LAB — HEPATIC FUNCTION PANEL
ALBUMIN: 4 g/dL (ref 3.5–5.2)
ALT: 39 U/L (ref 0–53)
AST: 27 U/L (ref 0–37)
Alkaline Phosphatase: 52 U/L (ref 39–117)
Bilirubin, Direct: 0.1 mg/dL (ref 0.0–0.3)
Total Bilirubin: 0.7 mg/dL (ref 0.2–1.2)
Total Protein: 6.9 g/dL (ref 6.0–8.3)

## 2016-07-22 LAB — LIPID PANEL
CHOL/HDL RATIO: 5
CHOLESTEROL: 170 mg/dL (ref 0–200)
HDL: 37.5 mg/dL — ABNORMAL LOW (ref >39.00)
NonHDL: 132.6
Triglycerides: 207 mg/dL — ABNORMAL HIGH (ref 0.0–149.0)
VLDL: 41.4 mg/dL — ABNORMAL HIGH (ref 0.0–40.0)

## 2016-07-22 LAB — POC URINALSYSI DIPSTICK (AUTOMATED)
Bilirubin, UA: NEGATIVE
Blood, UA: NEGATIVE
Glucose, UA: NEGATIVE
Ketones, UA: NEGATIVE
Leukocytes, UA: NEGATIVE
Nitrite, UA: NEGATIVE
PH UA: 5.5
PROTEIN UA: NEGATIVE
SPEC GRAV UA: 1.025
UROBILINOGEN UA: 0.2

## 2016-07-22 LAB — BASIC METABOLIC PANEL
BUN: 16 mg/dL (ref 6–23)
CO2: 32 mEq/L (ref 19–32)
Calcium: 9.5 mg/dL (ref 8.4–10.5)
Chloride: 102 mEq/L (ref 96–112)
Creatinine, Ser: 1.01 mg/dL (ref 0.40–1.50)
GFR: 79.93 mL/min (ref >60.00)
Glucose, Bld: 97 mg/dL (ref 70–99)
POTASSIUM: 4.8 meq/L (ref 3.5–5.1)
SODIUM: 140 meq/L (ref 135–145)

## 2016-07-22 LAB — PSA: PSA: 1.54 ng/mL (ref 0.10–4.00)

## 2016-07-22 LAB — TSH: TSH: 1.79 u[IU]/mL (ref 0.35–4.50)

## 2016-07-22 LAB — LDL CHOLESTEROL, DIRECT: Direct LDL: 99 mg/dL

## 2016-07-23 ENCOUNTER — Encounter: Payer: 59 | Admitting: Family Medicine

## 2016-07-29 ENCOUNTER — Encounter: Payer: Self-pay | Admitting: Family Medicine

## 2016-07-29 ENCOUNTER — Ambulatory Visit (INDEPENDENT_AMBULATORY_CARE_PROVIDER_SITE_OTHER): Payer: 59 | Admitting: Family Medicine

## 2016-07-29 VITALS — BP 136/82 | Temp 98.1°F | Ht 69.0 in | Wt 249.0 lb

## 2016-07-29 DIAGNOSIS — Z23 Encounter for immunization: Secondary | ICD-10-CM

## 2016-07-29 DIAGNOSIS — Z0000 Encounter for general adult medical examination without abnormal findings: Secondary | ICD-10-CM | POA: Diagnosis not present

## 2016-07-29 MED ORDER — METHOCARBAMOL 500 MG PO TABS: 500.0000 mg | ORAL_TABLET | Freq: Four times a day (QID) | ORAL | 5 refills | Status: DC

## 2016-07-29 MED ORDER — METOPROLOL SUCCINATE ER 50 MG PO TB24: 50.0000 mg | ORAL_TABLET | Freq: Every day | ORAL | 3 refills | Status: DC

## 2016-07-29 MED ORDER — MELOXICAM 15 MG PO TABS: 15.0000 mg | ORAL_TABLET | Freq: Every day | ORAL | 11 refills | Status: DC

## 2016-07-29 MED ORDER — ACYCLOVIR 200 MG PO CAPS: 200.0000 mg | ORAL_CAPSULE | Freq: Every day | ORAL | 3 refills | Status: DC

## 2016-07-29 MED ORDER — LEVOTHYROXINE SODIUM 150 MCG PO TABS: 150.0000 ug | ORAL_TABLET | Freq: Every day | ORAL | 3 refills | Status: DC

## 2016-07-29 MED ORDER — HYDROCODONE-ACETAMINOPHEN 10-325 MG PO TABS: 1.0000 | ORAL_TABLET | Freq: Four times a day (QID) | ORAL | 0 refills | Status: DC | PRN

## 2016-07-29 MED ORDER — EZETIMIBE 10 MG PO TABS: 10.0000 mg | ORAL_TABLET | Freq: Every day | ORAL | 3 refills | Status: DC

## 2016-07-29 MED ORDER — LOSARTAN POTASSIUM-HCTZ 100-25 MG PO TABS: 1.0000 | ORAL_TABLET | Freq: Every day | ORAL | 3 refills | Status: DC

## 2016-07-29 NOTE — Progress Notes (Signed)
Pre visit review using our clinic review tool, if applicable. No additional management support is needed unless otherwise documented below in the visit note. 

## 2016-07-29 NOTE — Progress Notes (Signed)
   Subjective:    Patient ID: Luke Skinner, male    DOB: 09/15/1956, 60 y.o.   MRN: [id number]  HPI 60 yr old male for a well exam. He feels fine except for hip pain. He is scheduled for a right hip total replacement on 10-21-16. He takes Meloxicam, Robaxin, and Norco as needed. He still plays golf several times a week.     Review of Systems  Constitutional: Negative.   HENT: Negative.   Eyes: Negative.   Respiratory: Negative.   Cardiovascular: Negative.   Gastrointestinal: Negative.   Genitourinary: Negative.   Musculoskeletal: Positive for arthralgias and gait problem. Negative for back pain, joint swelling, myalgias, neck pain and neck stiffness.  Skin: Negative.   Psychiatric/Behavioral: Negative.        Objective:   Physical Exam  Constitutional: He is oriented to person, place, and time. He appears well-developed and well-nourished. No distress.  HENT:  Head: Normocephalic and atraumatic.  Right Ear: External ear normal.  Left Ear: External ear normal.  Nose: Nose normal.  Mouth/Throat: Oropharynx is clear and moist. No oropharyngeal exudate.  Eyes: Conjunctivae and EOM are normal. Pupils are equal, round, and reactive to light. Right eye exhibits no discharge. Left eye exhibits no discharge. No scleral icterus.  Neck: Neck supple. No JVD present. No tracheal deviation present. No thyromegaly present.  Cardiovascular: Normal rate, regular rhythm, normal heart sounds and intact distal pulses.  Exam reveals no gallop and no friction rub.   No murmur heard. EKG is within normal limits   Pulmonary/Chest: Effort normal and breath sounds normal. No respiratory distress. He has no wheezes. He has no rales. He exhibits no tenderness.  Abdominal: Soft. Bowel sounds are normal. He exhibits no distension and no mass. There is no tenderness. There is no rebound and no guarding.  Genitourinary: Rectum normal, prostate normal and penis normal. Rectal exam shows guaiac negative stool.  No penile tenderness.  Musculoskeletal: Normal range of motion. He exhibits no edema or tenderness.  Lymphadenopathy:    He has no cervical adenopathy.  Neurological: He is alert and oriented to person, place, and time. He has normal reflexes. No cranial nerve deficit. He exhibits normal muscle tone. Coordination normal.  Skin: Skin is warm and dry. No rash noted. He is not diaphoretic. No erythema. No pallor.  Psychiatric: He has a normal mood and affect. His behavior is normal. Judgment and thought content normal.          Assessment & Plan:  Well exam. We discussed diet and exercise. He is cleared for his hip surgery. He will stop taking all NSAIDs 7 days prior to the surgery.  Laurey Morale, MD

## 2016-09-24 ENCOUNTER — Ambulatory Visit: Payer: Self-pay | Admitting: Orthopedic Surgery

## 2016-09-24 NOTE — Progress Notes (Signed)
Scheduling pre op- please PLACE SURGICAL ORDERS IN EPIC  thanks

## 2016-10-06 ENCOUNTER — Ambulatory Visit: Payer: Self-pay | Admitting: Orthopedic Surgery

## 2016-10-06 NOTE — H&P (Signed)
Luke Skinner DOB: 04/18/1956 Married / Language: English / Race: White Male Date of Admission:  10/21/2016 CC:  Right Hip Pain History of Present Illness  The patient is a 60 year old male who comes in for a preoperative History and Physical. The patient is scheduled for a right total hip arthroplasty (anterior) to be performed by Dr. Dione Plover. [name], MD at [facility] on 10-21-2016. The patient is a 60 year old male who presented for follow up of their hip. The patient is being followed for their right hip pain and osteoarthritis. They are months out from intra-articular injection. Symptoms reported include: pain and aching. The patient feels that they are doing poorly and report their pain level to be moderate to severe. Current treatment includes: NSAIDs (Meloxicam) and pain medications (Hydrocodone). The patient has not gotten any relief of their symptoms with Cortisone injections. Luke's hip has gotten progressively worse in the past several months. He is hurting at all times now. He is limiting what he can and cannot do. The last injection did help. His ready to proceed with surgery at this time. They have been treated conservatively in the past for the above stated problem and despite conservative measures, they continue to have progressive pain and severe functional limitations and dysfunction. They have failed non-operative management including home exercise, medications, and injections. It is felt that they would benefit from undergoing total joint replacement. Risks and benefits of the procedure have been discussed with the patient and they elect to proceed with surgery. There are no active contraindications to surgery such as ongoing infection or rapidly progressive neurological disease.  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 Statins Depletion *DIETARY PRODUCTS/DIETARY MANAGEMENT PRODUCTS*  Joint Pain  Family History Heart Disease  mother and grandfather fathers side Heart disease in male family member before age 71  Hypertension  mother Osteoarthritis  father  Social History  Number of flights of stairs before winded  greater than 5 Marital status  married Living situation  live with spouse Tobacco use  former smoker; smoke(d) 1/2 pack(s) per day Tobacco / smoke exposure  no Pain Contract  no Current work status  unemployed Children  1 Alcohol use  never consumed alcohol Illicit drug use  no Exercise  Exercises weekly; does running / walking Drug/Alcohol Rehab (Previously)  no  Medication History  Potassium Gluconate (Oral) Specific strength unknown - Active. Fish Oil (1000MG  Capsule, Oral) Active. Flax Seed Oil (1 (one) Oral) Specific strength unknown - Active. Vitamin C (1 (one) Oral) Specific strength unknown - Active. Multiple Vitamin (1 (one) Oral) Active. Sudafed 12 Hour (120MG  Tablet ER, Oral) Active. Stool Softener (100MG  Tablet, Oral) Active. Benadryl (25MG  Tablet, Oral) Active. Ketoconazole (2% Cream, External) Active. Mometasone Furoate (0.1% Cream, External) Active. Acyclovir (200MG  Capsule, Oral) Active. Viagra (Oral) Specific strength unknown - Active. Meloxicam (7.5MG  Tablet, 1 (one) Oral daily, Taken starting 06/05/2016) Active. (after a meal) Robaxin (500MG  Tablet, Oral) Active. Hydrocodone-Acetaminophen (5-325MG  Tablet, Oral as needed) Active. ClomiPHENE Citrate (50MG  Tablet, Oral) Active. Metoprolol Succinate ER (50MG  Tablet ER 24HR, Oral) Active. Zetia (10MG  Tablet, Oral) Active. Losartan Potassium (100MG  Tablet, Oral) Active. Synthroid (150MCG Tablet, Oral) Active.  Past Surgical History  Arthroscopy of Knee   left Arthroscopy of Shoulder  right Inguinal Hernia  Repair  Date: 55. open: left Straighten Nasal Septum  Date: 71. Total Knee Replacement  Date: 2008. left Right Shoulder Surgery  Date: 2005.   Review of Systems General Not Present- Chills, Fatigue, Fever, Memory Loss, Night Sweats, Weight Gain and Weight Loss. Skin Not Present- Eczema, Hives, Itching, Lesions and Rash. HEENT Not Present- Dentures, Double Vision, Headache, Hearing Loss, Tinnitus and Visual Loss. Respiratory Not Present- Allergies, Chronic Cough, Coughing up blood, Shortness of breath at rest and Shortness of breath with exertion. Cardiovascular Not Present- Chest Pain, Difficulty Breathing Lying Down, Murmur, Palpitations, Racing/skipping heartbeats and Swelling. Gastrointestinal Not Present- Abdominal Pain, Bloody Stool, Constipation, Diarrhea, Difficulty Swallowing, Heartburn, Jaundice, Loss of appetitie, Nausea and Vomiting. Male Genitourinary Not Present- Blood in Urine, Discharge, Flank Pain, Incontinence, Painful Urination, Urgency, Urinary frequency, Urinary Retention, Urinating at Night and Weak urinary stream. Musculoskeletal Present- Back Pain, Joint Pain, Morning Stiffness, Muscle Pain and Muscle Weakness. Not Present- Joint Swelling and Spasms. Neurological Not Present- Blackout spells, Difficulty with balance, Dizziness, Paralysis, Tremor and Weakness. Psychiatric Not Present- Insomnia.  Vitals Weight: 240 lb Height: 69in Weight was reported by patient. Height was reported by patient. Body Surface Area: 2.23 m Body Mass Index: 35.44 kg/m  Pulse: 68 (Regular)  BP: 142/72 (Sitting, Right Arm, Standard)   Physical Exam General Mental Status -Alert, cooperative and good historian. General Appearance-pleasant, Not in acute distress. Orientation-Oriented X3. Build & Nutrition-Well nourished and Well developed.  Head and Neck Head-normocephalic, atraumatic . Neck Global  Assessment - supple, no bruit auscultated on the right, no bruit auscultated on the left.  Eye Pupil - Bilateral-Regular and Round. Motion - Bilateral-EOMI.  Chest and Lung Exam Auscultation Breath sounds - clear at anterior chest wall and clear at posterior chest wall. Adventitious sounds - No Adventitious sounds.  Cardiovascular Auscultation Rhythm - Regular rate and rhythm. Heart Sounds - S1 WNL and S2 WNL. Murmurs & Other Heart Sounds - Auscultation of the heart reveals - No Murmurs.  Abdomen Palpation/Percussion Tenderness - Abdomen is non-tender to palpation. Rigidity (guarding) - Abdomen is soft. Auscultation Auscultation of the abdomen reveals - Bowel sounds normal.  Male Genitourinary Note: Not done, not pertinent to present illness   Musculoskeletal Note: On exam, he is in no distress. He is relatively flexed about 100, no internal rotation, about 20 external rotation, 20 abduction.  Assessment & Plan  Primary osteoarthritis of right hip (M16.11)  Note:Surgical Plans: Right Total Knee Replacement - Anterior Approach  Disposition: Home  PCP: Dr. Sharlene Motts - Patient has been seen preoperatively and felt to be stable for surgery.  IV TXA  Anesthesia Issues: None  Signed electronically by Ok Edwards, III PA-C

## 2016-10-13 ENCOUNTER — Other Ambulatory Visit (HOSPITAL_COMMUNITY): Payer: Self-pay | Admitting: *Deleted

## 2016-10-13 NOTE — Progress Notes (Signed)
LOV 11/01/15 cardio dr hilty Stress test 08/06/15 EPIC EKG 07/29/16 EPIC Medical clearance dr fry 07/29/16 in EPIC and on chart

## 2016-10-15 ENCOUNTER — Encounter (HOSPITAL_COMMUNITY): Payer: Self-pay

## 2016-10-15 ENCOUNTER — Encounter (HOSPITAL_COMMUNITY)
Admission: RE | Admit: 2016-10-15 | Discharge: 2016-10-15 | Disposition: A | Discharge status: Home / Self Care | Payer: 59 | Source: Ambulatory Visit | Attending: Orthopedic Surgery | Admitting: Orthopedic Surgery

## 2016-10-15 DIAGNOSIS — E039 Hypothyroidism, unspecified: Secondary | ICD-10-CM | POA: Insufficient documentation

## 2016-10-15 DIAGNOSIS — R42 Dizziness and giddiness: Secondary | ICD-10-CM | POA: Insufficient documentation

## 2016-10-15 DIAGNOSIS — M1990 Unspecified osteoarthritis, unspecified site: Secondary | ICD-10-CM | POA: Insufficient documentation

## 2016-10-15 DIAGNOSIS — L578 Other skin changes due to chronic exposure to nonionizing radiation: Secondary | ICD-10-CM | POA: Insufficient documentation

## 2016-10-15 DIAGNOSIS — E785 Hyperlipidemia, unspecified: Secondary | ICD-10-CM | POA: Insufficient documentation

## 2016-10-15 DIAGNOSIS — I10 Essential (primary) hypertension: Secondary | ICD-10-CM | POA: Insufficient documentation

## 2016-10-15 DIAGNOSIS — Z01812 Encounter for preprocedural laboratory examination: Secondary | ICD-10-CM | POA: Diagnosis not present

## 2016-10-15 DIAGNOSIS — R5383 Other fatigue: Secondary | ICD-10-CM | POA: Insufficient documentation

## 2016-10-15 HISTORY — DX: Personal history of urinary calculi: Z87.442

## 2016-10-15 HISTORY — DX: Hypothyroidism, unspecified: E03.9

## 2016-10-15 HISTORY — DX: Rash and other nonspecific skin eruption: R21

## 2016-10-15 HISTORY — DX: Pneumonia, unspecified organism: J18.9

## 2016-10-15 LAB — CBC
HCT: 42.9 % (ref 39.0–52.0)
HEMOGLOBIN: 13.9 g/dL (ref 13.0–17.0)
MCH: 27.6 pg (ref 26.0–34.0)
MCHC: 32.4 g/dL (ref 30.0–36.0)
MCV: 85.1 fL (ref 78.0–100.0)
PLATELETS: 257 10*3/uL (ref 150–400)
RBC: 5.04 MIL/uL (ref 4.22–5.81)
RDW: 13.8 % (ref 11.5–15.5)
WBC: 6.8 10*3/uL (ref 4.0–10.5)

## 2016-10-15 LAB — COMPREHENSIVE METABOLIC PANEL
ALBUMIN: 4.3 g/dL (ref 3.5–5.0)
ALK PHOS: 58 U/L (ref 38–126)
ALT: 41 U/L (ref 17–63)
ANION GAP: 7 (ref 5–15)
AST: 28 U/L (ref 15–41)
BUN: 18 mg/dL (ref 6–20)
CALCIUM: 9.8 mg/dL (ref 8.9–10.3)
CHLORIDE: 105 mmol/L (ref 101–111)
CO2: 28 mmol/L (ref 22–32)
Creatinine, Ser: 0.97 mg/dL (ref 0.61–1.24)
GFR calc Af Amer: >60 mL/min (ref >60)
GFR calc non Af Amer: >60 mL/min (ref >60)
GLUCOSE: 101 mg/dL — AB (ref 65–99)
Potassium: 4.2 mmol/L (ref 3.5–5.1)
SODIUM: 140 mmol/L (ref 135–145)
Total Bilirubin: 0.5 mg/dL (ref 0.3–1.2)
Total Protein: 7 g/dL (ref 6.5–8.1)

## 2016-10-15 LAB — PROTIME-INR
INR: 0.98
Prothrombin Time: 13 seconds (ref 11.4–15.2)

## 2016-10-15 LAB — SURGICAL PCR SCREEN
MRSA, PCR: NEGATIVE
Staphylococcus aureus: NEGATIVE

## 2016-10-15 LAB — APTT: APTT: 33 s (ref 24–36)

## 2016-10-15 NOTE — Patient Instructions (Signed)
Luke Skinner  10/15/2016   Your procedure is scheduled on: 10-21-16  Report to [facility] Main  Entrance take [facility]  elevators to 3rd floor to  [address] at 600  AM.  Call this number if you have problems the morning of surgery [phone]   Remember: ONLY 1 PERSON MAY GO WITH YOU TO SHORT STAY TO GET  READY MORNING OF [place].  Do not eat food or drink liquids :After Midnight.     Take these medicines the morning of surgery with A SIP OF WATER: SYNTHROID, HYDROCODONE IF NEEDED                               You may not have any metal on your body including hair pins and              piercings  Do not wear jewelry, make-up, lotions, powders or perfumes, deodorant             Do not wear nail polish.  Do not shave  48 hours prior to surgery.              Men may shave face and neck.   Do not bring valuables to the hospital. [place].  Contacts, dentures or bridgework may not be worn into surgery.  Leave suitcase in the car. After surgery it may be brought to your room.                  Please read over the following fact sheets you were given: _____________________________________________________________________             [facility] - Preparing for Surgery Before surgery, you can play an important role.  Because skin is not sterile, your skin needs to be as free of germs as possible.  You can reduce the number of germs on your skin by washing with CHG (chlorahexidine gluconate) soap before surgery.  CHG is an antiseptic cleaner which kills germs and bonds with the skin to continue killing germs even after washing. Please DO NOT use if you have an allergy to CHG or antibacterial soaps.  If your skin becomes reddened/irritated stop using the CHG and inform your nurse when you arrive at Short Stay. Do not shave (including legs and underarms) for at least 48 hours prior to the first CHG shower.   You may shave your face/neck. Please follow these instructions carefully:  1.  Shower with CHG Soap the night before surgery and the  morning of Surgery.  2.  If you choose to wash your hair, wash your hair first as usual with your  normal  shampoo.  3.  After you shampoo, rinse your hair and body thoroughly to remove the  shampoo.                           4.  Use CHG as you would any other liquid soap.  You can apply chg directly  to the skin and wash                       Gently with a scrungie or clean washcloth.  5.  Apply  the CHG Soap to your body ONLY FROM THE NECK DOWN.   Do not use on face/ open                           Wound or open sores. Avoid contact with eyes, ears mouth and genitals (private parts).                       Wash face,  Genitals (private parts) with your normal soap.             6.  Wash thoroughly, paying special attention to the area where your surgery  will be performed.  7.  Thoroughly rinse your body with warm water from the neck down.  8.  DO NOT shower/wash with your normal soap after using and rinsing off  the CHG Soap.                9.  Pat yourself dry with a clean towel.            10.  Wear clean pajamas.            11.  Place clean sheets on your bed the night of your first shower and do not  sleep with pets. Day of Surgery : Do not apply any lotions/deodorants the morning of surgery.  Please wear clean clothes to the hospital/surgery center.  FAILURE TO FOLLOW THESE INSTRUCTIONS MAY RESULT IN THE CANCELLATION OF YOUR SURGERY PATIENT SIGNATURE_________________________________  NURSE SIGNATURE__________________________________  ________________________________________________________________________   Adam Phenix  An incentive spirometer is a tool that can help keep your lungs clear and active. This tool measures how well you are filling your lungs with each breath. Taking long deep breaths may help reverse or decrease the chance of  developing breathing (pulmonary) problems (especially infection) following:  A long period of time when you are unable to move or be active. BEFORE THE PROCEDURE   If the spirometer includes an indicator to show your best effort, your nurse or respiratory therapist will set it to a desired goal.  If possible, sit up straight or lean slightly forward. Try not to slouch.  Hold the incentive spirometer in an upright position. INSTRUCTIONS FOR USE  1. Sit on the edge of your bed if possible, or sit up as far as you can in bed or on a chair. 2. Hold the incentive spirometer in an upright position. 3. Breathe out normally. 4. Place the mouthpiece in your mouth and seal your lips tightly around it. 5. Breathe in slowly and as deeply as possible, raising the piston or the ball toward the top of the column. 6. Hold your breath for 3-5 seconds or for as long as possible. Allow the piston or ball to fall to the bottom of the column. 7. Remove the mouthpiece from your mouth and breathe out normally. 8. Rest for a few seconds and repeat Steps 1 through 7 at least 10 times every 1-2 hours when you are awake. Take your time and take a few normal breaths between deep breaths. 9. The spirometer may include an indicator to show your best effort. Use the indicator as a goal to work toward during each repetition. 10. After each set of 10 deep breaths, practice coughing to be sure your lungs are clear. If you have an incision (the cut made at the time of surgery), support your incision when coughing by placing a pillow or rolled  up towels firmly against it. Once you are able to get out of bed, walk around indoors and cough well. You may stop using the incentive spirometer when instructed by your caregiver.  RISKS AND COMPLICATIONS  Take your time so you do not get dizzy or light-headed.  If you are in pain, you may need to take or ask for pain medication before doing incentive spirometry. It is harder to take a  deep breath if you are having pain. AFTER USE  Rest and breathe slowly and easily.  It can be helpful to keep track of a log of your progress. Your caregiver can provide you with a simple table to help with this. If you are using the spirometer at home, follow these instructions: [place] IF:   You are having difficultly using the spirometer.  You have trouble using the spirometer as often as instructed.  Your pain medication is not giving enough relief while using the spirometer.  You develop fever of 100.5 F (38.1 C) or higher. SEEK IMMEDIATE MEDICAL CARE IF:   You cough up bloody sputum that had not been present before.  You develop fever of 102 F (38.9 C) or greater.  You develop worsening pain at or near the incision site. MAKE SURE YOU:   Understand these instructions.  Will watch your condition.  Will get help right away if you are not doing well or get worse. Document Released: 02/15/2007 Document Revised: 12/28/2011 Document Reviewed: 04/18/2007 Exit[employer] Patient Information 2014 Exit[employer], [place].   ________________________________________________________________________   WHAT IS A BLOOD TRANSFUSION? Blood Transfusion Information  A transfusion is the replacement of blood or some of its parts. Blood is made up of multiple cells which provide different functions.  Red blood cells carry oxygen and are used for blood loss replacement.  White blood cells fight against infection.  Platelets control bleeding.  Plasma helps clot blood.  Other blood products are available for specialized needs, such as hemophilia or other clotting disorders. BEFORE THE TRANSFUSION  Who gives blood for transfusions?   Healthy volunteers who are fully evaluated to make sure their blood is safe. This is blood bank blood. Transfusion therapy is the safest it has ever been in the practice of medicine. Before blood is taken from a donor, a complete history is taken to make  sure that person has no history of diseases nor engages in risky social behavior (examples are intravenous drug use or sexual activity with multiple partners). The donor's travel history is screened to minimize risk of transmitting infections, such as malaria. The donated blood is tested for signs of infectious diseases, such as HIV and hepatitis. The blood is then tested to be sure it is compatible with you in order to minimize the chance of a transfusion reaction. If you or a relative donates blood, this is often done in anticipation of surgery and is not appropriate for emergency situations. It takes many days to process the donated blood. RISKS AND COMPLICATIONS Although transfusion therapy is very safe and saves many lives, the main dangers of transfusion include:   Getting an infectious disease.  Developing a transfusion reaction. This is an allergic reaction to something in the blood you were given. Every precaution is taken to prevent this. The decision to have a blood transfusion has been considered carefully by your caregiver before blood is given. Blood is not given unless the benefits outweigh the risks. AFTER THE TRANSFUSION  Right after receiving a blood transfusion, you will usually  feel much better and more energetic. This is especially true if your red blood cells have gotten low (anemic). The transfusion raises the level of the red blood cells which carry oxygen, and this usually causes an energy increase.  The nurse administering the transfusion will monitor you carefully for complications. HOME CARE INSTRUCTIONS  No special instructions are needed after a transfusion. You may find your energy is better. Speak with your caregiver about any limitations on activity for underlying diseases you may have. SEEK MEDICAL CARE IF:   Your condition is not improving after your transfusion.  You develop redness or irritation at the intravenous (IV) site. SEEK IMMEDIATE MEDICAL CARE IF:   Any of the following symptoms occur over the next 12 hours:  Shaking chills.  You have a temperature by mouth above 102 F (38.9 C), not controlled by medicine.  Chest, back, or muscle pain.  People around you feel you are not acting correctly or are confused.  Shortness of breath or difficulty breathing.  Dizziness and fainting.  You get a rash or develop hives.  You have a decrease in urine output.  Your urine turns a dark color or changes to pink, red, or brown. Any of the following symptoms occur over the next 10 days:  You have a temperature by mouth above 102 F (38.9 C), not controlled by medicine.  Shortness of breath.  Weakness after normal activity.  The white part of the eye turns yellow (jaundice).  You have a decrease in the amount of urine or are urinating less often.  Your urine turns a dark color or changes to pink, red, or brown. Document Released: 10/02/2000 Document Revised: 12/28/2011 Document Reviewed: 05/21/2008 [facility] Patient Information 2014 [place], [place].  _______________________________________________________________________

## 2016-10-21 ENCOUNTER — Inpatient Hospital Stay (HOSPITAL_COMMUNITY): Payer: 59

## 2016-10-21 ENCOUNTER — Inpatient Hospital Stay (HOSPITAL_COMMUNITY)
Admission: RE | Admit: 2016-10-21 | Discharge: 2016-10-23 | DRG: 470 | Disposition: A | Discharge status: Home / Self Care | Payer: 59 | Source: Ambulatory Visit | Attending: Orthopedic Surgery | Admitting: Orthopedic Surgery

## 2016-10-21 ENCOUNTER — Inpatient Hospital Stay (HOSPITAL_COMMUNITY): Payer: 59 | Admitting: Anesthesiology

## 2016-10-21 ENCOUNTER — Encounter (HOSPITAL_COMMUNITY): Payer: Self-pay | Admitting: *Deleted

## 2016-10-21 ENCOUNTER — Encounter (HOSPITAL_COMMUNITY)
Admission: RE | Disposition: A | Discharge status: Home / Self Care | Payer: Self-pay | Source: Ambulatory Visit | Attending: Orthopedic Surgery

## 2016-10-21 DIAGNOSIS — M1611 Unilateral primary osteoarthritis, right hip: Principal | ICD-10-CM | POA: Diagnosis present

## 2016-10-21 DIAGNOSIS — Z6836 Body mass index (BMI) 36.0-36.9, adult: Secondary | ICD-10-CM

## 2016-10-21 DIAGNOSIS — E039 Hypothyroidism, unspecified: Secondary | ICD-10-CM | POA: Diagnosis present

## 2016-10-21 DIAGNOSIS — Z79899 Other long term (current) drug therapy: Secondary | ICD-10-CM | POA: Diagnosis not present

## 2016-10-21 DIAGNOSIS — E669 Obesity, unspecified: Secondary | ICD-10-CM | POA: Diagnosis present

## 2016-10-21 DIAGNOSIS — Z96649 Presence of unspecified artificial hip joint: Secondary | ICD-10-CM

## 2016-10-21 DIAGNOSIS — E785 Hyperlipidemia, unspecified: Secondary | ICD-10-CM | POA: Diagnosis present

## 2016-10-21 DIAGNOSIS — I10 Essential (primary) hypertension: Secondary | ICD-10-CM | POA: Diagnosis present

## 2016-10-21 DIAGNOSIS — M169 Osteoarthritis of hip, unspecified: Secondary | ICD-10-CM | POA: Diagnosis present

## 2016-10-21 HISTORY — PX: TOTAL HIP ARTHROPLASTY: SHX124

## 2016-10-21 LAB — TYPE AND SCREEN
ABO/RH(D): O POS
Antibody Screen: NEGATIVE

## 2016-10-21 SURGERY — ARTHROPLASTY, HIP, TOTAL, ANTERIOR APPROACH
Anesthesia: General | Site: Hip | Laterality: Right

## 2016-10-21 MED ORDER — ROCURONIUM BROMIDE 10 MG/ML (PF) SYRINGE
PREFILLED_SYRINGE | INTRAVENOUS | Status: DC | PRN
  Administered 2016-10-21: 40 mg via INTRAVENOUS

## 2016-10-21 MED ORDER — PROPOFOL 10 MG/ML IV BOLUS
INTRAVENOUS | Status: AC
  Filled 2016-10-21: qty 20

## 2016-10-21 MED ORDER — ONDANSETRON HCL 4 MG/2ML IJ SOLN: 4.0000 mg | Freq: Four times a day (QID) | INTRAMUSCULAR | Status: DC | PRN

## 2016-10-21 MED ORDER — SUCCINYLCHOLINE CHLORIDE 200 MG/10ML IV SOSY
PREFILLED_SYRINGE | INTRAVENOUS | Status: DC | PRN
  Administered 2016-10-21: 120 mg via INTRAVENOUS

## 2016-10-21 MED ORDER — ACETAMINOPHEN 650 MG RE SUPP: 650.0000 mg | Freq: Four times a day (QID) | RECTAL | Status: DC | PRN

## 2016-10-21 MED ORDER — FLEET ENEMA 7-19 GM/118ML RE ENEM: 1.0000 | ENEMA | Freq: Once | RECTAL | Status: DC | PRN

## 2016-10-21 MED ORDER — DIPHENHYDRAMINE HCL 12.5 MG/5ML PO ELIX: 12.5000 mg | ORAL_SOLUTION | ORAL | Status: DC | PRN

## 2016-10-21 MED ORDER — CEFAZOLIN SODIUM-DEXTROSE 2-4 GM/100ML-% IV SOLN
2.0000 g | Freq: Four times a day (QID) | INTRAVENOUS | Status: AC
  Administered 2016-10-21 (×2): 2 g via INTRAVENOUS
  Filled 2016-10-21 (×2): qty 100

## 2016-10-21 MED ORDER — SODIUM CHLORIDE 0.9 % IV SOLN
1000.0000 mg | INTRAVENOUS | Status: AC
  Administered 2016-10-21: 1000 mg via INTRAVENOUS
  Filled 2016-10-21: qty 1100

## 2016-10-21 MED ORDER — OXYCODONE HCL 5 MG PO TABS
5.0000 mg | ORAL_TABLET | ORAL | Status: DC | PRN
  Administered 2016-10-21 (×2): 5 mg via ORAL
  Administered 2016-10-21 – 2016-10-22 (×4): 10 mg via ORAL
  Filled 2016-10-21 (×2): qty 2
  Filled 2016-10-21: qty 1
  Filled 2016-10-21 (×2): qty 2
  Filled 2016-10-21: qty 1
  Filled 2016-10-21: qty 2

## 2016-10-21 MED ORDER — DEXAMETHASONE SODIUM PHOSPHATE 10 MG/ML IJ SOLN
INTRAMUSCULAR | Status: AC
  Filled 2016-10-21: qty 1

## 2016-10-21 MED ORDER — LEVOTHYROXINE SODIUM 75 MCG PO TABS
150.0000 ug | ORAL_TABLET | Freq: Every day | ORAL | Status: DC
  Administered 2016-10-22 – 2016-10-23 (×2): 150 ug via ORAL
  Filled 2016-10-21 (×2): qty 2

## 2016-10-21 MED ORDER — MENTHOL 3 MG MT LOZG: 1.0000 | LOZENGE | OROMUCOSAL | Status: DC | PRN

## 2016-10-21 MED ORDER — HYDROMORPHONE HCL 2 MG/ML IJ SOLN
INTRAMUSCULAR | Status: AC
  Filled 2016-10-21: qty 1

## 2016-10-21 MED ORDER — POLYETHYLENE GLYCOL 3350 17 G PO PACK
17.0000 g | PACK | Freq: Every day | ORAL | Status: DC | PRN
Start: 2016-10-21 — End: 2016-10-23

## 2016-10-21 MED ORDER — ACETAMINOPHEN 10 MG/ML IV SOLN
1000.0000 mg | Freq: Once | INTRAVENOUS | Status: AC
  Administered 2016-10-21: 1000 mg via INTRAVENOUS
  Filled 2016-10-21: qty 100

## 2016-10-21 MED ORDER — SODIUM CHLORIDE 0.9 % IV SOLN
INTRAVENOUS | Status: DC
Start: 2016-10-21 — End: 2016-10-22
  Administered 2016-10-21: 13:00:00 via INTRAVENOUS

## 2016-10-21 MED ORDER — PROPOFOL 10 MG/ML IV BOLUS
INTRAVENOUS | Status: DC | PRN
  Administered 2016-10-21: 180 mg via INTRAVENOUS

## 2016-10-21 MED ORDER — HYDROCHLOROTHIAZIDE 25 MG PO TABS
25.0000 mg | ORAL_TABLET | Freq: Every day | ORAL | Status: DC
  Administered 2016-10-23: 25 mg via ORAL
  Filled 2016-10-21: qty 1

## 2016-10-21 MED ORDER — LOSARTAN POTASSIUM-HCTZ 100-25 MG PO TABS: 1.0000 | ORAL_TABLET | Freq: Every day | ORAL | Status: DC

## 2016-10-21 MED ORDER — SUGAMMADEX SODIUM 500 MG/5ML IV SOLN
INTRAVENOUS | Status: AC
  Filled 2016-10-21: qty 5

## 2016-10-21 MED ORDER — EZETIMIBE 10 MG PO TABS
10.0000 mg | ORAL_TABLET | Freq: Every day | ORAL | Status: DC
  Administered 2016-10-22 – 2016-10-23 (×2): 10 mg via ORAL
  Filled 2016-10-21 (×2): qty 1

## 2016-10-21 MED ORDER — MIDAZOLAM HCL 5 MG/5ML IJ SOLN
INTRAMUSCULAR | Status: DC | PRN
  Administered 2016-10-21: 2 mg via INTRAVENOUS

## 2016-10-21 MED ORDER — TRANEXAMIC ACID 1000 MG/10ML IV SOLN
1000.0000 mg | Freq: Once | INTRAVENOUS | Status: AC
  Administered 2016-10-21: 1000 mg via INTRAVENOUS
  Filled 2016-10-21: qty 10

## 2016-10-21 MED ORDER — RIVAROXABAN 10 MG PO TABS
10.0000 mg | ORAL_TABLET | Freq: Every day | ORAL | Status: DC
  Administered 2016-10-22 – 2016-10-23 (×2): 10 mg via ORAL
  Filled 2016-10-21 (×2): qty 1

## 2016-10-21 MED ORDER — BUPIVACAINE HCL (PF) 0.25 % IJ SOLN
INTRAMUSCULAR | Status: AC
  Filled 2016-10-21: qty 30

## 2016-10-21 MED ORDER — FENTANYL CITRATE (PF) 100 MCG/2ML IJ SOLN
INTRAMUSCULAR | Status: DC | PRN
  Administered 2016-10-21 (×2): 50 ug via INTRAVENOUS
  Administered 2016-10-21: 100 ug via INTRAVENOUS

## 2016-10-21 MED ORDER — METOCLOPRAMIDE HCL 5 MG/ML IJ SOLN: 5.0000 mg | Freq: Three times a day (TID) | INTRAMUSCULAR | Status: DC | PRN

## 2016-10-21 MED ORDER — PHENOL 1.4 % MT LIQD: 1.0000 | OROMUCOSAL | Status: DC | PRN

## 2016-10-21 MED ORDER — ONDANSETRON HCL 4 MG PO TABS: 4.0000 mg | ORAL_TABLET | Freq: Four times a day (QID) | ORAL | Status: DC | PRN

## 2016-10-21 MED ORDER — METOPROLOL SUCCINATE ER 50 MG PO TB24
50.0000 mg | ORAL_TABLET | Freq: Every day | ORAL | Status: DC
  Administered 2016-10-21 – 2016-10-22 (×2): 50 mg via ORAL
  Filled 2016-10-21 (×2): qty 1

## 2016-10-21 MED ORDER — CEFAZOLIN SODIUM-DEXTROSE 2-4 GM/100ML-% IV SOLN
2.0000 g | INTRAVENOUS | Status: AC
  Administered 2016-10-21: 2 g via INTRAVENOUS

## 2016-10-21 MED ORDER — MEPERIDINE HCL 50 MG/ML IJ SOLN: 6.2500 mg | INTRAMUSCULAR | Status: DC | PRN

## 2016-10-21 MED ORDER — FENTANYL CITRATE (PF) 100 MCG/2ML IJ SOLN
INTRAMUSCULAR | Status: AC
  Filled 2016-10-21: qty 4

## 2016-10-21 MED ORDER — EPHEDRINE 5 MG/ML INJ
INTRAVENOUS | Status: AC
  Filled 2016-10-21: qty 10

## 2016-10-21 MED ORDER — LOSARTAN POTASSIUM 50 MG PO TABS
100.0000 mg | ORAL_TABLET | Freq: Every day | ORAL | Status: DC
  Administered 2016-10-23: 100 mg via ORAL
  Filled 2016-10-21: qty 2

## 2016-10-21 MED ORDER — HYDROMORPHONE HCL 1 MG/ML IJ SOLN
0.2500 mg | INTRAMUSCULAR | Status: DC | PRN
  Administered 2016-10-21: 0.25 mg via INTRAVENOUS
  Administered 2016-10-21: 0.5 mg via INTRAVENOUS

## 2016-10-21 MED ORDER — DEXAMETHASONE SODIUM PHOSPHATE 10 MG/ML IJ SOLN
10.0000 mg | Freq: Once | INTRAMUSCULAR | Status: AC
  Administered 2016-10-21: 10 mg via INTRAVENOUS

## 2016-10-21 MED ORDER — LIDOCAINE 2% (20 MG/ML) 5 ML SYRINGE
INTRAMUSCULAR | Status: AC
  Filled 2016-10-21: qty 5

## 2016-10-21 MED ORDER — METHOCARBAMOL 1000 MG/10ML IJ SOLN
500.0000 mg | Freq: Four times a day (QID) | INTRAMUSCULAR | Status: DC | PRN
  Administered 2016-10-21: 500 mg via INTRAVENOUS
  Filled 2016-10-21: qty 5
  Filled 2016-10-21: qty 550

## 2016-10-21 MED ORDER — DOCUSATE SODIUM 100 MG PO CAPS
100.0000 mg | ORAL_CAPSULE | Freq: Two times a day (BID) | ORAL | Status: DC
  Administered 2016-10-21 – 2016-10-23 (×4): 100 mg via ORAL
  Filled 2016-10-21 (×4): qty 1

## 2016-10-21 MED ORDER — MIDAZOLAM HCL 2 MG/2ML IJ SOLN
INTRAMUSCULAR | Status: AC
  Filled 2016-10-21: qty 2

## 2016-10-21 MED ORDER — STERILE WATER FOR IRRIGATION IR SOLN
Status: DC | PRN
  Administered 2016-10-21: 2000 mL

## 2016-10-21 MED ORDER — CEFAZOLIN SODIUM-DEXTROSE 2-4 GM/100ML-% IV SOLN
INTRAVENOUS | Status: AC
  Filled 2016-10-21: qty 100

## 2016-10-21 MED ORDER — HYDROMORPHONE HCL 1 MG/ML IJ SOLN
INTRAMUSCULAR | Status: DC | PRN
  Administered 2016-10-21 (×4): 0.5 mg via INTRAVENOUS

## 2016-10-21 MED ORDER — LACTATED RINGERS IV SOLN
INTRAVENOUS | Status: DC
  Administered 2016-10-21 (×2): via INTRAVENOUS

## 2016-10-21 MED ORDER — EPHEDRINE SULFATE 50 MG/ML IJ SOLN
INTRAMUSCULAR | Status: DC | PRN
  Administered 2016-10-21 (×2): 10 mg via INTRAVENOUS

## 2016-10-21 MED ORDER — 0.9 % SODIUM CHLORIDE (POUR BTL) OPTIME
TOPICAL | Status: DC | PRN
  Administered 2016-10-21: 1000 mL

## 2016-10-21 MED ORDER — ACETAMINOPHEN 325 MG PO TABS: 650.0000 mg | ORAL_TABLET | Freq: Four times a day (QID) | ORAL | Status: DC | PRN

## 2016-10-21 MED ORDER — TRAMADOL HCL 50 MG PO TABS
50.0000 mg | ORAL_TABLET | Freq: Four times a day (QID) | ORAL | Status: DC | PRN
  Administered 2016-10-21 – 2016-10-22 (×2): 100 mg via ORAL
  Filled 2016-10-21 (×2): qty 2

## 2016-10-21 MED ORDER — BISACODYL 10 MG RE SUPP: 10.0000 mg | Freq: Every day | RECTAL | Status: DC | PRN

## 2016-10-21 MED ORDER — DEXAMETHASONE SODIUM PHOSPHATE 10 MG/ML IJ SOLN
10.0000 mg | Freq: Once | INTRAMUSCULAR | Status: AC
  Administered 2016-10-22: 10 mg via INTRAVENOUS
  Filled 2016-10-21: qty 1

## 2016-10-21 MED ORDER — PROMETHAZINE HCL 25 MG/ML IJ SOLN
6.2500 mg | INTRAMUSCULAR | Status: DC | PRN
Start: 2016-10-21 — End: 2016-10-21

## 2016-10-21 MED ORDER — ONDANSETRON HCL 4 MG/2ML IJ SOLN
INTRAMUSCULAR | Status: AC
  Filled 2016-10-21: qty 2

## 2016-10-21 MED ORDER — METHOCARBAMOL 500 MG PO TABS
500.0000 mg | ORAL_TABLET | Freq: Four times a day (QID) | ORAL | Status: DC | PRN
  Administered 2016-10-21 – 2016-10-23 (×5): 500 mg via ORAL
  Filled 2016-10-21 (×5): qty 1

## 2016-10-21 MED ORDER — HYDROMORPHONE HCL 1 MG/ML IJ SOLN
INTRAMUSCULAR | Status: AC
  Administered 2016-10-21: 0.25 mg via INTRAVENOUS
  Filled 2016-10-21: qty 1

## 2016-10-21 MED ORDER — ACETAMINOPHEN 500 MG PO TABS
1000.0000 mg | ORAL_TABLET | Freq: Four times a day (QID) | ORAL | Status: AC
  Administered 2016-10-21 – 2016-10-22 (×4): 1000 mg via ORAL
  Filled 2016-10-21 (×4): qty 2

## 2016-10-21 MED ORDER — LIDOCAINE 2% (20 MG/ML) 5 ML SYRINGE
INTRAMUSCULAR | Status: DC | PRN
  Administered 2016-10-21: 50 mg via INTRAVENOUS

## 2016-10-21 MED ORDER — MORPHINE SULFATE (PF) 2 MG/ML IV SOLN
1.0000 mg | INTRAVENOUS | Status: DC | PRN
  Administered 2016-10-21: 1 mg via INTRAVENOUS
  Filled 2016-10-21: qty 1

## 2016-10-21 MED ORDER — DIPHENHYDRAMINE HCL 25 MG PO CAPS
25.0000 mg | ORAL_CAPSULE | Freq: Every evening | ORAL | Status: DC | PRN
  Filled 2016-10-21: qty 1

## 2016-10-21 MED ORDER — BUPIVACAINE HCL (PF) 0.25 % IJ SOLN
INTRAMUSCULAR | Status: DC | PRN
Start: 2016-10-21 — End: 2016-10-21
  Administered 2016-10-21: 30 mL

## 2016-10-21 MED ORDER — SUGAMMADEX SODIUM 500 MG/5ML IV SOLN
INTRAVENOUS | Status: DC | PRN
  Administered 2016-10-21: 225 mg via INTRAVENOUS

## 2016-10-21 MED ORDER — METOCLOPRAMIDE HCL 5 MG PO TABS: 5.0000 mg | ORAL_TABLET | Freq: Three times a day (TID) | ORAL | Status: DC | PRN

## 2016-10-21 MED ORDER — ACETAMINOPHEN 10 MG/ML IV SOLN
INTRAVENOUS | Status: AC
  Filled 2016-10-21: qty 100

## 2016-10-21 SURGICAL SUPPLY — 38 items
BAG DECANTER FOR FLEXI CONT (MISCELLANEOUS) ×2 IMPLANT
BAG ZIPLOCK 12X15 (MISCELLANEOUS) ×2 IMPLANT
BLADE SAG 18X100X1.27 (BLADE) ×2 IMPLANT
CAPT HIP TOTAL 2 ×2 IMPLANT
CLOTH BEACON ORANGE TIMEOUT ST (SAFETY) ×2 IMPLANT
COVER PERINEAL POST (MISCELLANEOUS) ×2 IMPLANT
DECANTER SPIKE VIAL GLASS SM (MISCELLANEOUS) ×2 IMPLANT
DRAPE STERI IOBAN 125X83 (DRAPES) ×2 IMPLANT
DRAPE U-SHAPE 47X51 STRL (DRAPES) ×4 IMPLANT
DRSG ADAPTIC 3X8 NADH LF (GAUZE/BANDAGES/DRESSINGS) ×2 IMPLANT
DRSG MEPILEX BORDER 4X4 (GAUZE/BANDAGES/DRESSINGS) ×2 IMPLANT
DRSG MEPILEX BORDER 4X8 (GAUZE/BANDAGES/DRESSINGS) ×2 IMPLANT
DURAPREP 26ML APPLICATOR (WOUND CARE) ×2 IMPLANT
ELECT REM PT RETURN 9FT ADLT (ELECTROSURGICAL) ×2
ELECTRODE REM PT RTRN 9FT ADLT (ELECTROSURGICAL) ×1 IMPLANT
EVACUATOR 1/8 PVC DRAIN (DRAIN) ×2 IMPLANT
GLOVE BIO SURGEON STRL SZ7.5 (GLOVE) ×2 IMPLANT
GLOVE BIO SURGEON STRL SZ8 (GLOVE) ×6 IMPLANT
GLOVE BIOGEL PI IND STRL 7.0 (GLOVE) ×2 IMPLANT
GLOVE BIOGEL PI IND STRL 7.5 (GLOVE) ×1 IMPLANT
GLOVE BIOGEL PI IND STRL 8 (GLOVE) ×2 IMPLANT
GLOVE BIOGEL PI INDICATOR 7.0 (GLOVE) ×2
GLOVE BIOGEL PI INDICATOR 7.5 (GLOVE) ×1
GLOVE BIOGEL PI INDICATOR 8 (GLOVE) ×2
GLOVE SURG SS PI 7.5 STRL IVOR (GLOVE) ×2 IMPLANT
GOWN SPEC L3 XXLG W/TWL (GOWN DISPOSABLE) ×2 IMPLANT
GOWN STRL REUS W/TWL LRG LVL3 (GOWN DISPOSABLE) ×2 IMPLANT
GOWN STRL REUS W/TWL XL LVL3 (GOWN DISPOSABLE) ×4 IMPLANT
PACK ANTERIOR HIP CUSTOM (KITS) ×2 IMPLANT
STRIP CLOSURE SKIN 1/2X4 (GAUZE/BANDAGES/DRESSINGS) ×2 IMPLANT
SUT ETHIBOND NAB CT1 #1 30IN (SUTURE) ×2 IMPLANT
SUT MNCRL AB 4-0 PS2 18 (SUTURE) ×2 IMPLANT
SUT VIC AB 2-0 CT1 27 (SUTURE) ×3
SUT VIC AB 2-0 CT1 TAPERPNT 27 (SUTURE) ×3 IMPLANT
SUT VLOC 180 0 24IN GS25 (SUTURE) ×2 IMPLANT
SYR 50ML LL SCALE MARK (SYRINGE) IMPLANT
TRAY FOLEY W/METER SILVER 16FR (SET/KITS/TRAYS/PACK) ×2 IMPLANT
YANKAUER SUCT BULB TIP 10FT TU (MISCELLANEOUS) ×2 IMPLANT

## 2016-10-21 NOTE — Anesthesia Preprocedure Evaluation (Addendum)
Anesthesia Evaluation  Patient identified by MRN, date of birth, ID band Patient awake    Reviewed: Allergy & Precautions, NPO status , Patient's Chart, lab work & pertinent test results, reviewed documented beta blocker date and time   Airway Mallampati: III  TM Distance: >3 FB Neck ROM: Full    Dental  (+) Teeth Intact   Pulmonary pneumonia, unresolved, former smoker,    Pulmonary exam normal breath sounds clear to auscultation       Cardiovascular hypertension, Pt. on medications and Pt. on home beta blockers Normal cardiovascular exam Rhythm:Regular Rate:Normal     Neuro/Psych PSYCHIATRIC DISORDERS Depression negative neurological ROS     GI/Hepatic Neg liver ROS,   Endo/Other  Hypothyroidism Obesity Hyperlipidemia  Renal/GU negative Renal ROS   ED Elevated PSA    Musculoskeletal  (+) Arthritis , Osteoarthritis,  OA right hip   Abdominal (+) + obese,   Peds  Hematology negative hematology ROS (+)   Anesthesia Other Findings   Reproductive/Obstetrics                            Lab Results  Component Value Date   WBC 6.8 10/15/2016   HGB 13.9 10/15/2016   HCT 42.9 10/15/2016   MCV 85.1 10/15/2016   PLT 257 10/15/2016     Chemistry      Component Value Date/Time   NA 140 10/15/2016 1100   K 4.2 10/15/2016 1100   CL 105 10/15/2016 1100   CO2 28 10/15/2016 1100   BUN 18 10/15/2016 1100   CREATININE 0.97 10/15/2016 1100   CREATININE 0.92 07/26/2015 1326      Component Value Date/Time   CALCIUM 9.8 10/15/2016 1100   ALKPHOS 58 10/15/2016 1100   AST 28 10/15/2016 1100   ALT 41 10/15/2016 1100   BILITOT 0.5 10/15/2016 1100     EKG: normal sinus rhythm, widened QRS, possible ant wall MI.  Anesthesia Physical Anesthesia Plan  ASA: II  Anesthesia Plan: General   Post-op Pain Management:    Induction: Intravenous  Airway Management Planned: Oral ETT  Additional  Equipment:   Intra-op Plan:   Post-operative Plan: Extubation in OR  Informed Consent: I have reviewed the patients History and Physical, chart, labs and discussed the procedure including the risks, benefits and alternatives for the proposed anesthesia with the patient or authorized representative who has indicated his/her understanding and acceptance.   Dental advisory given  Plan Discussed with: Anesthesiologist, CRNA and Surgeon  Anesthesia Plan Comments:        Anesthesia Quick Evaluation

## 2016-10-21 NOTE — Anesthesia Procedure Notes (Addendum)

## 2016-10-21 NOTE — H&P (View-Only) (Signed)
Luke Skinner DOB: 1955/12/20 Married / Language: English / Race: White Male Date of Admission:  10/21/2016 CC:  Right Hip Pain History of Present Illness  The patient is a 61 year old male who comes in for a preoperative History and Physical. The patient is scheduled for a right total hip arthroplasty (anterior) to be performed by Dr. Dione Plover. [name], MD at [facility] on 10-21-2016. The patient is a 61 year old male who presented for follow up of their hip. The patient is being followed for their right hip pain and osteoarthritis. They are months out from intra-articular injection. Symptoms reported include: pain and aching. The patient feels that they are doing poorly and report their pain level to be moderate to severe. Current treatment includes: NSAIDs (Meloxicam) and pain medications (Hydrocodone). The patient has not gotten any relief of their symptoms with Cortisone injections. [name] has gotten progressively worse in the past several months. He is hurting at all times now. He is limiting what he can and cannot do. The last injection did help. His ready to proceed with surgery at this time. They have been treated conservatively in the past for the above stated problem and despite conservative measures, they continue to have progressive pain and severe functional limitations and dysfunction. They have failed non-operative management including home exercise, medications, and injections. It is felt that they would benefit from undergoing total joint replacement. Risks and benefits of the procedure have been discussed with the patient and they elect to proceed with surgery. There are no active contraindications to surgery such as ongoing infection or rapidly progressive neurological disease.  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 Statins Depletion *DIETARY PRODUCTS/DIETARY MANAGEMENT PRODUCTS*  Joint Pain  Family History Heart Disease  mother and grandfather fathers side Heart disease in male family member before age 63  Hypertension  mother Osteoarthritis  father  Social History  Number of flights of stairs before winded  greater than 5 Marital status  married Living situation  live with spouse Tobacco use  former smoker; smoke(d) 1/2 pack(s) per day Tobacco / smoke exposure  no Pain Contract  no Current work status  unemployed Children  1 Alcohol use  never consumed alcohol Illicit drug use  no Exercise  Exercises weekly; does running / walking Drug/Alcohol Rehab (Previously)  no  Medication History  Potassium Gluconate (Oral) Specific strength unknown - Active. Fish Oil (1000MG  Capsule, Oral) Active. Flax Seed Oil (1 (one) Oral) Specific strength unknown - Active. Vitamin C (1 (one) Oral) Specific strength unknown - Active. Multiple Vitamin (1 (one) Oral) Active. Sudafed 12 Hour (120MG  Tablet ER, Oral) Active. Stool Softener (100MG  Tablet, Oral) Active. Benadryl (25MG  Tablet, Oral) Active. Ketoconazole (2% Cream, External) Active. Mometasone Furoate (0.1% Cream, External) Active. Acyclovir (200MG  Capsule, Oral) Active. Viagra (Oral) Specific strength unknown - Active. Meloxicam (7.5MG  Tablet, 1 (one) Oral daily, Taken starting 06/05/2016) Active. (after a meal) Robaxin (500MG  Tablet, Oral) Active. Hydrocodone-Acetaminophen (5-325MG  Tablet, Oral as needed) Active. ClomiPHENE Citrate (50MG  Tablet, Oral) Active. Metoprolol Succinate ER (50MG  Tablet ER 24HR, Oral) Active. Zetia (10MG  Tablet, Oral) Active. Losartan Potassium (100MG  Tablet, Oral) Active. Synthroid (150MCG Tablet, Oral) Active.  Past Surgical History  Arthroscopy of Knee   left Arthroscopy of Shoulder  right Inguinal Hernia  Repair  Date: 41. open: left Straighten Nasal Septum  Date: 77. Total Knee Replacement  Date: 2008. left Right Shoulder Surgery  Date: 2005.   Review of Systems General Not Present- Chills, Fatigue, Fever, Memory Loss, Night Sweats, Weight Gain and Weight Loss. Skin Not Present- Eczema, Hives, Itching, Lesions and Rash. HEENT Not Present- Dentures, Double Vision, Headache, Hearing Loss, Tinnitus and Visual Loss. Respiratory Not Present- Allergies, Chronic Cough, Coughing up blood, Shortness of breath at rest and Shortness of breath with exertion. Cardiovascular Not Present- Chest Pain, Difficulty Breathing Lying Down, Murmur, Palpitations, Racing/skipping heartbeats and Swelling. Gastrointestinal Not Present- Abdominal Pain, Bloody Stool, Constipation, Diarrhea, Difficulty Swallowing, Heartburn, Jaundice, Loss of appetitie, Nausea and Vomiting. Male Genitourinary Not Present- Blood in Urine, Discharge, Flank Pain, Incontinence, Painful Urination, Urgency, Urinary frequency, Urinary Retention, Urinating at Night and Weak urinary stream. Musculoskeletal Present- Back Pain, Joint Pain, Morning Stiffness, Muscle Pain and Muscle Weakness. Not Present- Joint Swelling and Spasms. Neurological Not Present- Blackout spells, Difficulty with balance, Dizziness, Paralysis, Tremor and Weakness. Psychiatric Not Present- Insomnia.  Vitals Weight: 240 lb Height: 69in Weight was reported by patient. Height was reported by patient. Body Surface Area: 2.23 m Body Mass Index: 35.44 kg/m  Pulse: 68 (Regular)  BP: 142/72 (Sitting, Right Arm, Standard)   Physical Exam General Mental Status -Alert, cooperative and good historian. General Appearance-pleasant, Not in acute distress. Orientation-Oriented X3. Build & Nutrition-Well nourished and Well developed.  Head and Neck Head-normocephalic, atraumatic . Neck Global  Assessment - supple, no bruit auscultated on the right, no bruit auscultated on the left.  Eye Pupil - Bilateral-Regular and Round. Motion - Bilateral-EOMI.  Chest and Lung Exam Auscultation Breath sounds - clear at anterior chest wall and clear at posterior chest wall. Adventitious sounds - No Adventitious sounds.  Cardiovascular Auscultation Rhythm - Regular rate and rhythm. Heart Sounds - S1 WNL and S2 WNL. Murmurs & Other Heart Sounds - Auscultation of the heart reveals - No Murmurs.  Abdomen Palpation/Percussion Tenderness - Abdomen is non-tender to palpation. Rigidity (guarding) - Abdomen is soft. Auscultation Auscultation of the abdomen reveals - Bowel sounds normal.  Male Genitourinary Note: Not done, not pertinent to present illness   Musculoskeletal Note: On exam, he is in no distress. He is relatively flexed about 100, no internal rotation, about 20 external rotation, 20 abduction.  Assessment & Plan  Primary osteoarthritis of right hip (M16.11)  Note:Surgical Plans: Right Total Knee Replacement - Anterior Approach  Disposition: Home  PCP: Dr. Sharlene Motts - Patient has been seen preoperatively and felt to be stable for surgery.  IV TXA  Anesthesia Issues: None  Signed electronically by Ok Edwards, III PA-C

## 2016-10-21 NOTE — Evaluation (Signed)
Physical Therapy Evaluation Patient Details Name: Luke Skinner MRN: [id number] DOB: 28-Dec-1955 Today's Date: 10/21/2016   History of Present Illness  s/p R DA THA  Clinical Impression  Pt is s/p THA resulting in the deficits listed below (see PT Problem List). * Pt will benefit from skilled PT to increase their independence and safety with mobility to allow discharge to the venue listed below.      Follow Up Recommendations Home health PT;Outpatient PT;No PT follow up (defer to MD)    Equipment Recommendations  None recommended by PT    Recommendations for Other Services       Precautions / Restrictions Precautions Precautions: Fall Restrictions Other Position/Activity Restrictions: WBAT      Mobility  Bed Mobility Overal bed mobility: Needs Assistance Bed Mobility: Supine to Sit     Supine to sit: Min guard;Min assist     General bed mobility comments: assist for RLE  Transfers Overall transfer level: Needs assistance Equipment used: Rolling walker (2 wheeled) Transfers: Sit to/from Stand Sit to Stand: Min guard         General transfer comment: cues for hand placement  Ambulation/Gait Ambulation/Gait assistance: Min guard Ambulation Distance (Feet): 80 Feet Assistive device: Rolling walker (2 wheeled) Gait Pattern/deviations: Step-to pattern;Step-through pattern;Decreased weight shift to right     General Gait Details: cues for upward gaze, step length  Stairs            Wheelchair Mobility    Modified Rankin (Stroke Patients Only)       Balance                                             Pertinent Vitals/Pain Pain Assessment: 0-10 Pain Score: 3  Pain Location: right hip Pain Descriptors / Indicators: Sore Pain Intervention(s): Limited activity within patient's tolerance;Monitored during session    Home Living Family/patient expects to be discharged to:: Private residence Living Arrangements:  Spouse/significant other Available Help at Discharge: Family Type of Home: House Home Access: Stairs to enter   Technical brewer of Steps: 1 Home Layout: One level Home Equipment: Environmental consultant - 2 wheels;Bedside commode      Prior Function Level of Independence: Independent         Comments: avid golfer     Hand Dominance        Extremity/Trunk Assessment   Upper Extremity Assessment Upper Extremity Assessment: Overall WFL for tasks assessed    Lower Extremity Assessment Lower Extremity Assessment: RLE deficits/detail RLE Deficits / Details: knee and hip grossly 3/5, ankle WFL       Communication   Communication: No difficulties  Cognition Arousal/Alertness: Awake/alert Behavior During Therapy: WFL for tasks assessed/performed Overall Cognitive Status: Within Functional Limits for tasks assessed                      General Comments      Exercises Total Joint Exercises Ankle Circles/Pumps: AROM;Both;10 reps Quad Sets: 10 reps;Both;AROM   Assessment/Plan    PT Assessment Patient needs continued PT services  PT Problem List Decreased strength;Decreased range of motion;Decreased mobility;Decreased activity tolerance;Pain;Decreased knowledge of use of DME          PT Treatment Interventions DME instruction;Gait training;Functional mobility training;Therapeutic activities;Therapeutic exercise;Patient/family education;Stair training    PT Goals (Current goals can be found in the Care Plan section)  Acute Rehab  PT Goals Patient Stated Goal: back to playing golf asap PT Goal Formulation: With patient Time For Goal Achievement: 10/24/16 Potential to Achieve Goals: Good    Frequency 7X/week   Barriers to discharge        Co-evaluation               End of Session   Activity Tolerance: Patient tolerated treatment well Patient left: in chair;with call bell/phone within reach;with chair alarm set;with family/visitor present            Time: [id number] PT Time Calculation (min) (ACUTE ONLY): 14 min   Charges:   PT Evaluation $PT Eval Low Complexity: 1 Procedure     PT G Codes:        Laura Caldas 2016/11/13, 4:15 PM

## 2016-10-21 NOTE — Interval H&P Note (Signed)
History and Physical Interval Note:  10/21/2016 7:57 AM  Luke Skinner  has presented today for surgery, with the diagnosis of RIGHT HIP OA  The various methods of treatment have been discussed with the patient and family. After consideration of risks, benefits and other options for treatment, the patient has consented to  Procedure(s): RIGHT TOTAL HIP ARTHROPLASTY ANTERIOR APPROACH (Right) as a surgical intervention .  The patient's history has been reviewed, patient examined, no change in status, stable for surgery.  I have reviewed the patient's chart and labs.  Questions were answered to the patient's satisfaction.     [name]

## 2016-10-21 NOTE — Op Note (Signed)
OPERATIVE REPORT- TOTAL HIP ARTHROPLASTY   PREOPERATIVE DIAGNOSIS: Osteoarthritis of the Right hip.   POSTOPERATIVE DIAGNOSIS: Osteoarthritis of the Right  hip.   PROCEDURE: Right total hip arthroplasty, anterior approach.   SURGEON: Gaynelle Arabian, MD   ASSISTANT: Arlee Muslim, PA-C  ANESTHESIA:  General  ESTIMATED BLOOD LOSS:-700 ml    DRAINS: Hemovac x1.   COMPLICATIONS: None   CONDITION: PACU - hemodynamically stable.   BRIEF CLINICAL NOTE: Luke Skinner is a 61 y.o. male who has advanced end-  stage arthritis of their Right  hip with progressively worsening pain and  dysfunction.The patient has failed nonoperative management and presents for  total hip arthroplasty.   PROCEDURE IN DETAIL: After successful administration of spinal  anesthetic, the traction boots for the [facility] bed were placed on both  feet and the patient was placed onto the [facility] bed, boots placed into the leg  holders. The Right hip was then isolated from the perineum with plastic  drapes and prepped and draped in the usual sterile fashion. ASIS and  greater trochanter were marked and a oblique incision was made, starting  at about 1 cm lateral and 2 cm distal to the ASIS and coursing towards  the anterior cortex of the femur. The skin was cut with a 10 blade  through subcutaneous tissue to the level of the fascia overlying the  tensor fascia lata muscle. The fascia was then incised in line with the  incision at the junction of the anterior third and posterior 2/3rd. The  muscle was teased off the fascia and then the interval between the TFL  and the rectus was developed. The Hohmann retractor was then placed at  the top of the femoral neck over the capsule. The vessels overlying the  capsule were cauterized and the fat on top of the capsule was removed.  A Hohmann retractor was then placed anterior underneath the rectus  femoris to give exposure to the entire anterior capsule. A T-shaped   capsulotomy was performed. The edges were tagged and the femoral head  was identified.       Osteophytes are removed off the superior acetabulum.  The femoral neck was then cut in situ with an oscillating saw. Traction  was then applied to the left lower extremity utilizing the [facility]  traction. The femoral head was then removed. Retractors were placed  around the acetabulum and then circumferential removal of the labrum was  performed. Osteophytes were also removed. Reaming starts at 47 mm to  medialize and  Increased in 2 mm increments to 53 mm. We reamed in  approximately 40 degrees of abduction, 20 degrees anteversion. A 54 mm  pinnacle acetabular shell was then impacted in anatomic position under  fluoroscopic guidance with excellent purchase. We did not need to place  any additional dome screws. A 36 mm neutral + 4 marathon liner was then  placed into the acetabular shell.       The femoral lift was then placed along the lateral aspect of the femur  just distal to the vastus ridge. The leg was  externally rotated and capsule  was stripped off the inferior aspect of the femoral neck down to the  level of the lesser trochanter, this was done with electrocautery. The femur was lifted after this was performed. The  leg was then placed in an extended and adducted position essentially delivering the femur. We also removed the capsule superiorly and the piriformis from the piriformis  fossa to gain excellent exposure of the  proximal femur. Rongeur was used to remove some cancellous bone to get  into the lateral portion of the proximal femur for placement of the  initial starter reamer. The starter broaches was placed  the starter broach  and was shown to go down the center of the canal. Broaching  with the  Corail system was then performed starting at size 8, coursing  Up to size 12. A size 12 had excellent torsional and rotational  and axial stability. The trial high offset neck was then  placed  with a 36 + 1.5 trial head. The hip was then reduced. We confirmed that  the stem was in the canal both on AP and lateral x-rays. It also has excellent sizing. The hip was reduced with outstanding stability through full extension and full external rotation.. AP pelvis was taken and the leg lengths were measured and found to be equal. Hip was then dislocated again and the femoral head and neck removed. The  femoral broach was removed. Size 12 Corail stem with a high offset  neck was then impacted into the femur following native anteversion. Has  excellent purchase in the canal. Excellent torsional and rotational and  axial stability. It is confirmed to be in the canal on AP and lateral  fluoroscopic views. The 36 + 1.5 ceramic head was placed and the hip  reduced with outstanding stability. Again AP pelvis was taken and it  confirmed that the leg lengths were equal. The wound was then copiously  irrigated with saline solution and the capsule reattached and repaired  with Ethibond suture. 30 ml of .25% Bupivicaine was  injected into the capsule and into the edge of the tensor fascia lata as well as subcutaneous tissue. The fascia overlying the tensor fascia lata was then closed with a running #1 V-Loc. Subcu was closed with interrupted 2-0 Vicryl and subcuticular running 4-0 Monocryl. Incision was cleaned  and dried. Steri-Strips and a bulky sterile dressing applied. Hemovac  drain was hooked to suction and then the patient was awakened and transported to  recovery in stable condition.        Please note that a surgical assistant was a medical necessity for this procedure to perform it in a safe and expeditious manner. Assistant was necessary to provide appropriate retraction of vital neurovascular structures and to prevent femoral fracture and allow for anatomic placement of the prosthesis.  Gaynelle Arabian, M.D.

## 2016-10-21 NOTE — Transfer of Care (Signed)
Immediate Anesthesia Transfer of Care Note  Patient: Luke Skinner  Procedure(s) Performed: Procedure(s): RIGHT TOTAL HIP ARTHROPLASTY ANTERIOR APPROACH (Right)  Patient Location: PACU  Anesthesia Type:General  Level of Consciousness:  sedated, patient cooperative and responds to stimulation  Airway & Oxygen Therapy:Patient Spontanous Breathing and Patient connected to face mask oxgen  Post-op Assessment:  Report given to PACU RN and Post -op Vital signs reviewed and stable  Post vital signs:  Reviewed and stable  Last Vitals:  Vitals:   10/21/16 0652  BP: 130/64  Pulse: 66  Resp: 16  Temp: [id number] C    Complications: No apparent anesthesia complications

## 2016-10-21 NOTE — Anesthesia Postprocedure Evaluation (Signed)
Anesthesia Post Note  Patient: Sanjaya Philemon Brensinger  Procedure(s) Performed: Procedure(s) (LRB): RIGHT TOTAL HIP ARTHROPLASTY ANTERIOR APPROACH (Right)  Patient location during evaluation: PACU Anesthesia Type: General Level of consciousness: awake and alert and oriented Pain management: pain level controlled Vital Signs Assessment: post-procedure vital signs reviewed and stable Respiratory status: spontaneous breathing, nonlabored ventilation, respiratory function stable and patient connected to nasal cannula oxygen Cardiovascular status: blood pressure returned to baseline and stable Postop Assessment: no signs of nausea or vomiting Anesthetic complications: no       Last Vitals:  Vitals:   10/21/16 1045 10/21/16 1100  BP: (!) 155/81 (!) 164/84  Pulse: 77 60  Resp: 12 10  Temp:      Last Pain:  Vitals:   10/21/16 1045  TempSrc:   PainSc: 6                  Hazelee Harbold A.

## 2016-10-22 ENCOUNTER — Other Ambulatory Visit: Payer: Self-pay | Admitting: Family Medicine

## 2016-10-22 LAB — BASIC METABOLIC PANEL
ANION GAP: 8 (ref 5–15)
BUN: 16 mg/dL (ref 6–20)
CALCIUM: 8.4 mg/dL — AB (ref 8.9–10.3)
CO2: 26 mmol/L (ref 22–32)
CREATININE: 0.95 mg/dL (ref 0.61–1.24)
Chloride: 104 mmol/L (ref 101–111)
GFR calc Af Amer: >60 mL/min (ref >60)
GFR calc non Af Amer: >60 mL/min (ref >60)
GLUCOSE: 128 mg/dL — AB (ref 65–99)
Potassium: 4 mmol/L (ref 3.5–5.1)
Sodium: 138 mmol/L (ref 135–145)

## 2016-10-22 LAB — CBC
HEMATOCRIT: 32.9 % — AB (ref 39.0–52.0)
Hemoglobin: 10.8 g/dL — ABNORMAL LOW (ref 13.0–17.0)
MCH: 28.1 pg (ref 26.0–34.0)
MCHC: 32.8 g/dL (ref 30.0–36.0)
MCV: 85.5 fL (ref 78.0–100.0)
Platelets: 224 10*3/uL (ref 150–400)
RBC: 3.85 MIL/uL — ABNORMAL LOW (ref 4.22–5.81)
RDW: 13.6 % (ref 11.5–15.5)
WBC: 14.3 10*3/uL — ABNORMAL HIGH (ref 4.0–10.5)

## 2016-10-22 MED ORDER — RIVAROXABAN 10 MG PO TABS: 10.0000 mg | ORAL_TABLET | Freq: Every day | ORAL | 0 refills | Status: DC

## 2016-10-22 MED ORDER — METHOCARBAMOL 500 MG PO TABS: 500.0000 mg | ORAL_TABLET | Freq: Four times a day (QID) | ORAL | 0 refills | Status: DC | PRN

## 2016-10-22 MED ORDER — LEVOTHYROXINE SODIUM 150 MCG PO TABS: 150.0000 ug | ORAL_TABLET | Freq: Every day | ORAL | 2 refills | Status: DC

## 2016-10-22 MED ORDER — TRAMADOL HCL 50 MG PO TABS
50.0000 mg | ORAL_TABLET | Freq: Four times a day (QID) | ORAL | Status: DC | PRN
  Administered 2016-10-22 – 2016-10-23 (×2): 100 mg via ORAL
  Filled 2016-10-22 (×2): qty 2

## 2016-10-22 MED ORDER — OXYCODONE HCL 5 MG PO TABS: 5.0000 mg | ORAL_TABLET | ORAL | 0 refills | Status: DC | PRN

## 2016-10-22 MED ORDER — METOPROLOL SUCCINATE ER 50 MG PO TB24: 50.0000 mg | ORAL_TABLET | Freq: Every day | ORAL | 2 refills | Status: DC

## 2016-10-22 MED ORDER — MELOXICAM 15 MG PO TABS: 15.0000 mg | ORAL_TABLET | Freq: Every day | ORAL | 2 refills | Status: DC

## 2016-10-22 MED ORDER — LOSARTAN POTASSIUM-HCTZ 100-25 MG PO TABS: 1.0000 | ORAL_TABLET | Freq: Every day | ORAL | 2 refills | Status: DC

## 2016-10-22 MED ORDER — TRAMADOL HCL 50 MG PO TABS: 50.0000 mg | ORAL_TABLET | Freq: Four times a day (QID) | ORAL | 0 refills | Status: DC | PRN

## 2016-10-22 MED ORDER — RIVAROXABAN 10 MG PO TABS
10.0000 mg | ORAL_TABLET | Freq: Every day | ORAL | 0 refills | Status: DC
Start: 2016-10-22 — End: 2016-10-22

## 2016-10-22 MED ORDER — EZETIMIBE 10 MG PO TABS: 10.0000 mg | ORAL_TABLET | Freq: Every day | ORAL | 2 refills | Status: DC

## 2016-10-22 MED ORDER — HYDROCODONE-ACETAMINOPHEN 5-325 MG PO TABS
1.0000 | ORAL_TABLET | ORAL | Status: DC | PRN
  Administered 2016-10-22 – 2016-10-23 (×4): 2 via ORAL
  Filled 2016-10-22 (×4): qty 2

## 2016-10-22 NOTE — Progress Notes (Signed)
   Subjective: 1 Day Post-Op Procedure(s) (LRB): RIGHT TOTAL HIP ARTHROPLASTY ANTERIOR APPROACH (Right) Patient reports pain as mild.   Patient seen in rounds by Dr. Wynelle Link. Patient is well, but has had some minor complaints of pain in the hip, requiring pain medications We will start therapy today.  If they do well with therapy and meets all goals, then will allow home later this afternoon following therapy. Plan is to go Home after hospital stay.  Objective: Vital signs in last 24 hours: Temp:  [96.9 F (36.1 C)-98.4 F (36.9 C)] 98.1 F (36.7 C) (01/04 0521) Pulse Rate:  [60-83] 70 (01/04 0521) Resp:  [8-18] 18 (01/04 0521) BP: (116-172)/(53-84) 116/53 (01/04 0521) SpO2:  [93 %-100 %] 96 % (01/04 0521) FiO2 (%):  [2 %] 2 % (01/04 0234) Weight:  [110.7 kg (244 lb)] 110.7 kg (244 lb) (01/03 1130)  Intake/Output from previous day:  Intake/Output Summary (Last 24 hours) at 10/22/16 0805 Last data filed at 10/22/16 0720  Gross per 24 hour  Intake          3656.67 ml  Output             4355 ml  Net          -698.33 ml    Intake/Output this shift: Total I/O In: -  Out: 250 [Urine:250]  Labs:  Recent Labs  10/22/16 0447  HGB 10.8*    Recent Labs  10/22/16 0447  WBC 14.3*  RBC 3.85*  HCT 32.9*  PLT 224    Recent Labs  10/22/16 0447  NA 138  K 4.0  CL 104  CO2 26  BUN 16  CREATININE 0.95  GLUCOSE 128*  CALCIUM 8.4*   No results for input(s): LABPT, INR in the last 72 hours.  EXAM General - Patient is Alert, Appropriate and Oriented Extremity - Neurovascular intact Sensation intact distally Intact pulses distally Dressing - dressing C/D/I Motor Function - intact, moving foot and toes well on exam.  Hemovac pulled without difficulty.  Past Medical History:  Diagnosis Date  . Allergy   . Arthritis    oa  . Herpes stomatitis    fever blisters occasional  . History of kidney stones   . Hyperlipidemia   . Hypertension   . Hypothyroidism   .  Pneumonia 2006 last time   several times   . Rash    below both knees for many years uses selsum blue to wash with    Assessment/Plan: 1 Day Post-Op Procedure(s) (LRB): RIGHT TOTAL HIP ARTHROPLASTY ANTERIOR APPROACH (Right) Principal Problem:   OA (osteoarthritis) of hip  Estimated body mass index is 36.03 kg/m as calculated from the following:   Height as of this encounter: 5\' 9"  (1.753 m).   Weight as of this encounter: 110.7 kg (244 lb). Up with therapy Discharge home with home health  DVT Prophylaxis - Xarelto Weight Bearing As Tolerated right Leg Hemovac Pulled Begin Therapy  If meets goals and able to go home: Discharge home with home health Diet - Cardiac diet Follow up - in 2 weeks Activity - WBAT Disposition - Home Condition Upon Discharge - Good D/C Meds - See DC Summary DVT Prophylaxis - Xarelto  Arlee Muslim, PA-C Orthopaedic Surgery 10/22/2016, 8:05 AM

## 2016-10-22 NOTE — Telephone Encounter (Signed)
Addendum:  Also received 3 more requests that were filled on 07/29/16 for 1 year to [facility].  Refilled for 9 months to [facility].  Losartan-hctz, ezetimibe, Synthroid, meloxican and metoprolol.

## 2016-10-22 NOTE — Addendum Note (Signed)
Addended by: Miles Costain T on: 10/22/2016 12:38 PM   Modules accepted: Orders

## 2016-10-22 NOTE — Care Management Note (Signed)
Case Management Note  Patient Details  Name: Luke Skinner MRN: [id number] Date of Birth: 29-Mar-1956  Subjective/Objective:                  Osteoarthritis of the Righthip Action/Plan: Discharge planning Expected Discharge Date:  10/22/16             Expected Discharge Plan:  [place]  In-House Referral:     Discharge planning Services  CM Consult  Post Acute Care Choice:  Home Health Choice offered to:  Patient  DME Arranged:  N/A DME Agency:  NA  HH Arranged:  PT [place] Agency:  [facility] ([facility]  Status of Service:  Completed, signed off  If discussed at [employer], dates discussed:    Additional Comments: CM met with pt in room to offer choice of home health agency. Pt chooses [facility] to render HHPT.  Referral given to [facility] rep, Tim.  Pt states he has all DME needed at home.  NO other CM needs were communicated. Dellie Catholic, RN 10/22/2016, 9:40 AM

## 2016-10-22 NOTE — Telephone Encounter (Signed)
Both medications filled on 07/29/16 for 1 year to [facility].  Received request for medications to be filled by [facility].  Sent in 9 months.  Remaining on original rx to [facility].

## 2016-10-22 NOTE — Evaluation (Signed)
Occupational Therapy Evaluation Patient Details Name: Luke Skinner MRN: [id number] DOB: Jun 02, 1956 Today's Date: 10/22/2016    History of Present Illness s/p R DA THA   Clinical Impression   Pt is s/p THA resulting in the deficits listed below (see OT Problem List). Pt will benefit from skilled OT to increase their safety and independence with ADL and functional mobility for ADL to facilitate discharge to venue listed below.       Follow Up Recommendations  No OT follow up    Equipment Recommendations  None recommended by OT       Precautions / Restrictions Precautions Precautions: Fall Restrictions Weight Bearing Restrictions: No Other Position/Activity Restrictions: WBAT      Mobility Bed Mobility Overal bed mobility: Needs Assistance Bed Mobility: Supine to Sit     Supine to sit: Min assist Sit to supine: Min assist   General bed mobility comments: assist for RLE  Transfers Overall transfer level: Needs assistance Equipment used: Rolling walker (2 wheeled) Transfers: Sit to/from [facility] Sit to Stand: Min guard Stand pivot transfers: Min guard       General transfer comment: cues for hand placement         ADL Overall ADL's : Needs assistance/impaired Eating/Feeding: Set up;Sitting   Grooming: Set up;Sitting   Upper Body Bathing: Set up;Sitting   Lower Body Bathing: Moderate assistance;Sit to/from stand;Cueing for safety;Cueing for sequencing   Upper Body Dressing : Set up   Lower Body Dressing: Moderate assistance;Sit to/from stand;Cueing for safety;Cueing for sequencing   Toilet Transfer: Minimal assistance;RW Toilet Transfer Details (indicate cue type and reason): sit to stand for urinal Toileting- Clothing Manipulation and Hygiene: Minimal assistance;Sit to/from stand;Cueing for safety;Cueing for sequencing         General ADL Comments: RN in room for part of session as pt had been dizzy.  Pt agreed to get up to  chair and stay for lunch although he was very sleepy               Pertinent Vitals/Pain Pain Assessment: 0-10 Pain Score: 4  Pain Location: R hip/thigh Pain Descriptors / Indicators: Sore;Tightness Pain Intervention(s): Monitored during session;Repositioned;Ice applied        Extremity/Trunk Assessment Upper Extremity Assessment Upper Extremity Assessment: Overall WFL for tasks assessed           Communication Communication Communication: No difficulties   Cognition Arousal/Alertness: Awake/alert Behavior During Therapy: WFL for tasks assessed/performed Overall Cognitive Status: Within Functional Limits for tasks assessed                                Home Living Family/patient expects to be discharged to:: Private residence Living Arrangements: Spouse/significant other Available Help at Discharge: Family Type of Home: House Home Access: Stairs to enter Technical brewer of Steps: 1   Home Layout: One level     Bathroom Shower/Tub: Tub/shower unit         Home Equipment: Environmental consultant - 2 wheels;Bedside commode          Prior Functioning/Environment Level of Independence: Independent        Comments: avid golfer        OT Problem List: Decreased strength;Decreased activity tolerance   OT Treatment/Interventions: Self-care/ADL training;Patient/family education;DME and/or AE instruction    OT Goals(Current goals can be found in the care plan section) Acute Rehab OT Goals Patient Stated Goal: back to playing golf asap  OT Frequency: Min 2X/week   Barriers to D/C: Decreased caregiver support             End of Session Equipment Utilized During Treatment: Rolling walker Nurse Communication: Mobility status  Activity Tolerance: Patient tolerated treatment well Patient left: in bed   Time: 1130-1159 OT Time Calculation (min): 29 min Charges:  OT General Charges $OT Visit: 1 Procedure OT Evaluation $OT Eval Moderate  Complexity: 1 Procedure OT Treatments $Self Care/Home Management : 8-22 mins G-Codes:    Payton Mccallum D 10-31-16, 12:50 PM

## 2016-10-22 NOTE — Progress Notes (Signed)
Physical Therapy Treatment Patient Details Name: Luke Skinner MRN: [id number] DOB: Aug 22, 1956 Today's Date: 10/22/2016    History of Present Illness s/p R DA THA    PT Comments    Progressing well with mobility. Will plan to have a 2nd session prior to d/c.   Follow Up Recommendations  Home health PT     Equipment Recommendations  None recommended by PT    Recommendations for Other Services       Precautions / Restrictions Precautions Precautions: Fall Restrictions Weight Bearing Restrictions: No Other Position/Activity Restrictions: WBAT    Mobility  Bed Mobility Overal bed mobility: Needs Assistance Bed Mobility: Supine to Sit;Sit to Supine     Supine to sit: Min assist Sit to supine: Min assist   General bed mobility comments: assist for RLE  Transfers Overall transfer level: Needs assistance Equipment used: Rolling walker (2 wheeled) Transfers: Sit to/from Stand Sit to Stand: Min guard         General transfer comment: cues for hand placement  Ambulation/Gait Ambulation/Gait assistance: Min guard Ambulation Distance (Feet): 115 Feet Assistive device: Rolling walker (2 wheeled) Gait Pattern/deviations: Step-to pattern     General Gait Details: close guard for safety. slow gait speed. No LOB   Stairs            Wheelchair Mobility    Modified Rankin (Stroke Patients Only)       Balance                                    Cognition Arousal/Alertness: Awake/alert Behavior During Therapy: WFL for tasks assessed/performed Overall Cognitive Status: Within Functional Limits for tasks assessed                      Exercises Total Joint Exercises Ankle Circles/Pumps: AROM;Both;15 reps Quad Sets: 10 reps;Both;AROM;15 reps Heel Slides: AAROM;Right;15 reps;Supine Hip ABduction/ADduction: AAROM;Right;15 reps;Supine    General Comments        Pertinent Vitals/Pain Pain Assessment: 0-10 Pain Score: 4  Pain  Location: R hip/thigh Pain Descriptors / Indicators: Sore;Tightness Pain Intervention(s): Monitored during session;Repositioned;Ice applied    Home Living Family/patient expects to be discharged to:: Private residence Living Arrangements: Spouse/significant other Available Help at Discharge: Family Type of Home: House Home Access: Stairs to enter   Home Layout: One level Home Equipment: Environmental consultant - 2 wheels;Bedside commode      Prior Function Level of Independence: Independent      Comments: avid golfer   PT Goals (current goals can now be found in the care plan section) Acute Rehab PT Goals Patient Stated Goal: back to playing golf asap Progress towards PT goals: Progressing toward goals    Frequency    7X/week      PT Plan Current plan remains appropriate    Co-evaluation             End of Session   Activity Tolerance: Patient tolerated treatment well Patient left: in bed;with call bell/phone within reach;with family/visitor present     Time: [id number] PT Time Calculation (min) (ACUTE ONLY): 21 min  Charges:  $Gait Training: 8-22 mins                    G Codes:      Weston Anna, MPT Pager: [phone]

## 2016-10-22 NOTE — Discharge Instructions (Addendum)
° °Dr. Frank [name] °Total Joint Specialist °[facility] °[address] °[place], [place] [address] °[phone] ° °ANTERIOR APPROACH TOTAL HIP REPLACEMENT POSTOPERATIVE DIRECTIONS ° ° °Hip Rehabilitation, Guidelines Following Surgery  °The results of a hip operation are greatly improved after range of motion and muscle strengthening exercises. Follow all safety measures which are given to protect your hip. If any of these exercises cause increased pain or swelling in your joint, decrease the amount until you are comfortable again. Then slowly increase the exercises. Call your caregiver if you have problems or questions.  ° °HOME CARE INSTRUCTIONS  °Remove items at home which could result in a fall. This includes throw rugs or furniture in walking pathways.  °· ICE to the affected hip every three hours for 30 minutes at a time and then as needed for pain and swelling.  Continue to use ice on the hip for pain and swelling from surgery. You may notice swelling that will progress down to the foot and ankle.  This is normal after surgery.  Elevate the leg when you are not up walking on it.   °· Continue to use the breathing machine which will help keep your temperature down.  It is common for your temperature to cycle up and down following surgery, especially at night when you are not up moving around and exerting yourself.  The breathing machine keeps your lungs expanded and your temperature down. ° ° °DIET °You may resume your previous home diet once your are discharged from the hospital. ° °DRESSING / WOUND CARE / SHOWERING °You may shower 3 days after surgery, but keep the wounds dry during showering.  You may use an occlusive plastic wrap (Press'n Seal for example), NO SOAKING/SUBMERGING IN THE BATHTUB.  If the bandage gets wet, change with a clean dry gauze.  If the incision gets wet, pat the wound dry with a clean towel. °You may start showering once you are discharged home but do not  submerge the incision under water. Just pat the incision dry and apply a dry gauze dressing on daily. °Change the surgical dressing daily and reapply a dry dressing each time. ° °ACTIVITY °Walk with your walker as instructed. °Use walker as long as suggested by your caregivers. °Avoid periods of inactivity such as sitting longer than an hour when not asleep. This helps prevent blood clots.  °You may resume a sexual relationship in one month or when given the OK by your doctor.  °You may return to work once you are cleared by your doctor.  °Do not drive a car for 6 weeks or until released by you surgeon.  °Do not drive while taking narcotics. ° °WEIGHT BEARING °Weight bearing as tolerated with assist device (walker, cane, etc) as directed, use it as long as suggested by your surgeon or therapist, typically at least 4-6 weeks. ° °POSTOPERATIVE CONSTIPATION PROTOCOL °Constipation - defined medically as fewer than three stools per week and severe constipation as less than one stool per week. ° °One of the most common issues patients have following surgery is constipation.  Even if you have a regular bowel pattern at home, your normal regimen is likely to be disrupted due to multiple reasons following surgery.  Combination of anesthesia, postoperative narcotics, change in appetite and fluid intake all can affect your bowels.  In order to avoid complications following surgery, here are some recommendations in order to help you during your recovery period. ° °Colace (docusate) - Pick up an over-the-counter   form of Colace or another stool softener and take twice a day as long as you are requiring postoperative pain medications.  Take with a full glass of water daily.  If you experience loose stools or diarrhea, hold the colace until you stool forms back up.  If your symptoms do not get better within 1 week or if they get worse, check with your doctor. ° °Dulcolax (bisacodyl) - Pick up over-the-counter and take as directed  by the product packaging as needed to assist with the movement of your bowels.  Take with a full glass of water.  Use this product as needed if not relieved by Colace only.  ° °MiraLax (polyethylene glycol) - Pick up over-the-counter to have on hand.  MiraLax is a solution that will increase the amount of water in your bowels to assist with bowel movements.  Take as directed and can mix with a glass of water, juice, soda, coffee, or tea.  Take if you go more than two days without a movement. °Do not use MiraLax more than once per day. Call your doctor if you are still constipated or irregular after using this medication for 7 days in a row. ° °If you continue to have problems with postoperative constipation, please contact the office for further assistance and recommendations.  If you experience "the worst abdominal pain ever" or develop nausea or vomiting, please contact the office immediatly for further recommendations for treatment. ° °ITCHING ° If you experience itching with your medications, try taking only a single pain pill, or even half a pain pill at a time.  You can also use Benadryl over the counter for itching or also to help with sleep.  ° °TED HOSE STOCKINGS °Wear the elastic stockings on both legs for three weeks following surgery during the day but you may remove then at night for sleeping. ° °MEDICATIONS °See your medication summary on the “After Visit Summary” that the nursing staff will review with you prior to discharge.  You may have some home medications which will be placed on hold until you complete the course of blood thinner medication.  It is important for you to complete the blood thinner medication as prescribed by your surgeon.  Continue your approved medications as instructed at time of discharge. ° °PRECAUTIONS °If you experience chest pain or shortness of breath - call 911 immediately for transfer to the hospital emergency department.  °If you develop a fever greater that 101 F,  purulent drainage from wound, increased redness or drainage from wound, foul odor from the wound/dressing, or calf pain - CONTACT YOUR SURGEON.   °                                                °FOLLOW-UP APPOINTMENTS °Make sure you keep all of your appointments after your operation with your surgeon and caregivers. You should call the office at the above phone number and make an appointment for approximately two weeks after the date of your surgery or on the date instructed by your surgeon outlined in the "After Visit Summary". ° °RANGE OF MOTION AND STRENGTHENING EXERCISES  °These exercises are designed to help you keep full movement of your hip joint. Follow your caregiver's or physical therapist's instructions. Perform all exercises about fifteen times, three times per day or as directed. Exercise both hips, even if you   have had only one joint replacement. These exercises can be done on a training (exercise) mat, on the floor, on a table or on a bed. Use whatever works the best and is most comfortable for you. Use music or television while you are exercising so that the exercises are a pleasant break in your day. This will make your life better with the exercises acting as a break in routine you can look forward to.  Lying on your back, slowly slide your foot toward your buttocks, raising your knee up off the floor. Then slowly slide your foot back down until your leg is straight again.  Lying on your back spread your legs as far apart as you can without causing discomfort.  Lying on your side, raise your upper leg and foot straight up from the floor as far as is comfortable. Slowly lower the leg and repeat.  Lying on your back, tighten up the muscle in the front of your thigh (quadriceps muscles). You can do this by keeping your leg straight and trying to raise your heel off the floor. This helps strengthen the largest muscle supporting your knee.  Lying on your back, tighten up the muscles of your  buttocks both with the legs straight and with the knee bent at a comfortable angle while keeping your heel on the floor.   IF YOU ARE TRANSFERRED TO A SKILLED REHAB FACILITY If the patient is transferred to a skilled rehab facility following release from the hospital, a list of the current medications will be sent to the facility for the patient to continue.  When discharged from the skilled rehab facility, please have the facility set up the patient's [place] prior to being released. Also, the skilled facility will be responsible for providing the patient with their medications at time of release from the facility to include their pain medication, the muscle relaxants, and their blood thinner medication. If the patient is still at the rehab facility at time of the two week follow up appointment, the skilled rehab facility will also need to assist the patient in arranging follow up appointment in our office and any transportation needs.  MAKE SURE YOU:  Understand these instructions.  Get help right away if you are not doing well or get worse.    Pick up stool softner and laxative for home use following surgery while on pain medications. Do not submerge incision under water. Please use good hand washing techniques while changing dressing each day. May shower starting three days after surgery. Please use a clean towel to pat the incision dry following showers. Continue to use ice for pain and swelling after surgery. Do not use any lotions or creams on the incision until instructed by your surgeon.  Take Xarelto for two and a half more weeks, then discontinue Xarelto. Once the patient has completed the blood thinner regimen, then take a Baby 81 mg Aspirin daily for three more weeks.   Information on my medicine - XARELTO (Rivaroxaban)  This medication education was reviewed with me or my healthcare representative as part of my discharge preparation.  The pharmacist that  spoke with me during my hospital stay was:  Leonard Why was Xarelto prescribed for you? Xarelto was prescribed for you to reduce the risk of blood clots forming after orthopedic surgery. The medical term for these abnormal blood clots is venous thromboembolism (VTE).  What do you need to know about xarelto ? Take your Xarelto ONCE DAILY  at the same time every day. You may take it either with or without food.  If you have difficulty swallowing the tablet whole, you may crush it and mix in applesauce just prior to taking your dose.  Take Xarelto exactly as prescribed by your doctor and DO NOT stop taking Xarelto without talking to the doctor who prescribed the medication.  Stopping without other VTE prevention medication to take the place of Xarelto may increase your risk of developing a clot.  After discharge, you should have regular check-up appointments with your healthcare provider that is prescribing your Xarelto.    What do you do if you miss a dose? If you miss a dose, take it as soon as you remember on the same day then continue your regularly scheduled once daily regimen the next day. Do not take two doses of Xarelto on the same day.   Important Safety Information A possible side effect of Xarelto is bleeding. You should call your healthcare provider right away if you experience any of the following: ? Bleeding from an injury or your nose that does not stop. ? Unusual colored urine (red or dark brown) or unusual colored stools (red or black). ? Unusual bruising for unknown reasons. ? A serious fall or if you hit your head (even if there is no bleeding).  Some medicines may interact with Xarelto and might increase your risk of bleeding while on Xarelto. To help avoid this, consult your healthcare provider or pharmacist prior to using any new prescription or non-prescription medications, including herbals, vitamins, non-steroidal anti-inflammatory drugs (NSAIDs) and  supplements.  This website has more information on Xarelto: [email or web address]

## 2016-10-22 NOTE — Progress Notes (Signed)
Physical Therapy Treatment Patient Details Name: Luke Skinner MRN: [id number] DOB: 02/19/1956 Today's Date: 10/22/2016    History of Present Illness s/p R DA THA    PT Comments    POD # 1 pm session Pt feeling "bad" with increased c/o nausea and dizziness with activity.  Also increased c/o pain this afternoon.  Amb a short distance then assisted back to bed.  Pt not ready to D/C to home today.   Follow Up Recommendations  Home health PT     Equipment Recommendations  None recommended by PT    Recommendations for Other Services       Precautions / Restrictions Precautions Precautions: Fall Restrictions Weight Bearing Restrictions: No Other Position/Activity Restrictions: WBAT    Mobility  Bed Mobility Overal bed mobility: Needs Assistance Bed Mobility: Supine to Sit     Supine to sit: Min assist Sit to supine: Min assist   General bed mobility comments: increased assist needed this session due to increased c/o fatigue and nausea  Transfers Overall transfer level: Needs assistance Equipment used: Rolling walker (2 wheeled) Transfers: Sit to/from [facility] Sit to Stand: Min assist Stand pivot transfers: Min guard       General transfer comment: increased time and 25% VC's on safety with turn completion  Ambulation/Gait Ambulation/Gait assistance: Min guard Ambulation Distance (Feet): 65 Feet Assistive device: Rolling walker (2 wheeled)   Gait velocity: decreased   General Gait Details: decreased amb distance due to increased c/o pain and overall feeling "bad"   Stairs            Wheelchair Mobility    Modified Rankin (Stroke Patients Only)       Balance                                    Cognition Arousal/Alertness: Awake/alert Behavior During Therapy: WFL for tasks assessed/performed Overall Cognitive Status: Within Functional Limits for tasks assessed                      Exercises       General Comments        Pertinent Vitals/Pain Pain Assessment: 0-10 Pain Score: 8  Pain Location: R hip/thigh Pain Descriptors / Indicators: Operative site guarding;Tender;Sore;Moaning Pain Intervention(s): Monitored during session;Repositioned;Ice applied    Home Living                      Prior Function            PT Goals (current goals can now be found in the care plan section) Progress towards PT goals: Progressing toward goals    Frequency    7X/week      PT Plan Current plan remains appropriate    Co-evaluation             End of Session Equipment Utilized During Treatment: Gait belt Activity Tolerance: Patient limited by fatigue Patient left: in bed;with call bell/phone within reach;with family/visitor present     Time: [id number] PT Time Calculation (min) (ACUTE ONLY): 26 min  Charges:  $Gait Training: 8-22 mins $Therapeutic Activity: 8-22 mins                    G Codes:      Rica Koyanagi  PTA [facility]  Acute  Rehab Pager      [phone]

## 2016-10-22 NOTE — Discharge Summary (Signed)
Physician Discharge Summary   Patient ID: Luke Skinner MRN: [id number] DOB/AGE: [birth year] 61 y.o.  Admit date: 10/21/2016 Discharge date: 10/23/2016  Primary Diagnosis:  Osteoarthritis of the Right hip.   Admission Diagnoses:  Past Medical History:  Diagnosis Date  . Allergy   . Arthritis    oa  . Herpes stomatitis    fever blisters occasional  . History of kidney stones   . Hyperlipidemia   . Hypertension   . Hypothyroidism   . Pneumonia 2006 last time   several times   . Rash    below both knees for many years uses selsum blue to wash with   Discharge Diagnoses:   Principal Problem:   OA (osteoarthritis) of hip  Estimated body mass index is 36.03 kg/m as calculated from the following:   Height as of this encounter: 5' 9"  (1.753 m).   Weight as of this encounter: 110.7 kg (244 lb).  Procedure(s) (LRB): RIGHT TOTAL HIP ARTHROPLASTY ANTERIOR APPROACH (Right)   Consults: None  HPI: Luke Skinner is a 61 y.o. male who has advanced end-  stage arthritis of their Right  hip with progressively worsening pain and  dysfunction.The patient has failed nonoperative management and presents for  total hip arthroplasty.  Laboratory Data: Admission on 10/21/2016  Component Date Value Ref Range Status  . WBC 10/22/2016 14.3* 4.0 - 10.5 K/uL Final  . RBC 10/22/2016 3.85* 4.22 - 5.81 MIL/uL Final  . Hemoglobin 10/22/2016 10.8* 13.0 - 17.0 g/dL Final  . HCT 10/22/2016 32.9* 39.0 - 52.0 % Final  . MCV 10/22/2016 85.5  78.0 - 100.0 fL Final  . MCH 10/22/2016 28.1  26.0 - 34.0 pg Final  . MCHC 10/22/2016 32.8  30.0 - 36.0 g/dL Final  . RDW 10/22/2016 13.6  11.5 - 15.5 % Final  . Platelets 10/22/2016 224  150 - 400 K/uL Final  . Sodium 10/22/2016 138  135 - 145 mmol/L Final  . Potassium 10/22/2016 4.0  3.5 - 5.1 mmol/L Final  . Chloride 10/22/2016 104  101 - 111 mmol/L Final  . CO2 10/22/2016 26  22 - 32 mmol/L Final  . Glucose, Bld 10/22/2016 128* 65 - 99 mg/dL Final  .  BUN 10/22/2016 16  6 - 20 mg/dL Final  . Creatinine, Ser 10/22/2016 0.95  0.61 - 1.24 mg/dL Final  . Calcium 10/22/2016 8.4* 8.9 - 10.3 mg/dL Final  . GFR calc non Af Amer 10/22/2016 >60  >60 mL/min Final  . GFR calc Af Amer 10/22/2016 >60  >60 mL/min Final   Comment: (NOTE) The eGFR has been calculated using the CKD EPI equation. This calculation has not been validated in all clinical situations. eGFR's persistently <60 mL/min signify possible Chronic Kidney Disease.   . Anion gap 10/22/2016 8  5 - 15 Final  . WBC 10/23/2016 13.3* 4.0 - 10.5 K/uL Final  . RBC 10/23/2016 3.64* 4.22 - 5.81 MIL/uL Final  . Hemoglobin 10/23/2016 10.4* 13.0 - 17.0 g/dL Final  . HCT 10/23/2016 31.1* 39.0 - 52.0 % Final  . MCV 10/23/2016 85.4  78.0 - 100.0 fL Final  . MCH 10/23/2016 28.6  26.0 - 34.0 pg Final  . MCHC 10/23/2016 33.4  30.0 - 36.0 g/dL Final  . RDW 10/23/2016 13.7  11.5 - 15.5 % Final  . Platelets 10/23/2016 238  150 - 400 K/uL Final  . Sodium 10/23/2016 141  135 - 145 mmol/L Final  . Potassium 10/23/2016 4.0  3.5 - 5.1 mmol/L Final  .  Chloride 10/23/2016 103  101 - 111 mmol/L Final  . CO2 10/23/2016 28  22 - 32 mmol/L Final  . Glucose, Bld 10/23/2016 117* 65 - 99 mg/dL Final  . BUN 10/23/2016 14  6 - 20 mg/dL Final  . Creatinine, Ser 10/23/2016 0.84  0.61 - 1.24 mg/dL Final  . Calcium 10/23/2016 8.7* 8.9 - 10.3 mg/dL Final  . GFR calc non Af Amer 10/23/2016 >60  >60 mL/min Final  . GFR calc Af Amer 10/23/2016 >60  >60 mL/min Final   Comment: (NOTE) The eGFR has been calculated using the CKD EPI equation. This calculation has not been validated in all clinical situations. eGFR's persistently <60 mL/min signify possible Chronic Kidney Disease.   Georgiann Hahn gap 10/23/2016 10  5 - 15 Final  Hospital Outpatient Visit on 10/15/2016  Component Date Value Ref Range Status  . aPTT 10/15/2016 33  24 - 36 seconds Final  . WBC 10/15/2016 6.8  4.0 - 10.5 K/uL Final  . RBC 10/15/2016 5.04  4.22 -  5.81 MIL/uL Final  . Hemoglobin 10/15/2016 13.9  13.0 - 17.0 g/dL Final  . HCT 10/15/2016 42.9  39.0 - 52.0 % Final  . MCV 10/15/2016 85.1  78.0 - 100.0 fL Final  . MCH 10/15/2016 27.6  26.0 - 34.0 pg Final  . MCHC 10/15/2016 32.4  30.0 - 36.0 g/dL Final  . RDW 10/15/2016 13.8  11.5 - 15.5 % Final  . Platelets 10/15/2016 257  150 - 400 K/uL Final  . Sodium 10/15/2016 140  135 - 145 mmol/L Final  . Potassium 10/15/2016 4.2  3.5 - 5.1 mmol/L Final  . Chloride 10/15/2016 105  101 - 111 mmol/L Final  . CO2 10/15/2016 28  22 - 32 mmol/L Final  . Glucose, Bld 10/15/2016 101* 65 - 99 mg/dL Final  . BUN 10/15/2016 18  6 - 20 mg/dL Final  . Creatinine, Ser 10/15/2016 0.97  0.61 - 1.24 mg/dL Final  . Calcium 10/15/2016 9.8  8.9 - 10.3 mg/dL Final  . Total Protein 10/15/2016 7.0  6.5 - 8.1 g/dL Final  . Albumin 10/15/2016 4.3  3.5 - 5.0 g/dL Final  . AST 10/15/2016 28  15 - 41 U/L Final  . ALT 10/15/2016 41  17 - 63 U/L Final  . Alkaline Phosphatase 10/15/2016 58  38 - 126 U/L Final  . Total Bilirubin 10/15/2016 0.5  0.3 - 1.2 mg/dL Final  . GFR calc non Af Amer 10/15/2016 >60  >60 mL/min Final  . GFR calc Af Amer 10/15/2016 >60  >60 mL/min Final   Comment: (NOTE) The eGFR has been calculated using the CKD EPI equation. This calculation has not been validated in all clinical situations. eGFR's persistently <60 mL/min signify possible Chronic Kidney Disease.   . Anion gap 10/15/2016 7  5 - 15 Final  . Prothrombin Time 10/15/2016 13.0  11.4 - 15.2 seconds Final  . INR 10/15/2016 0.98   Final  . ABO/RH(D) 10/21/2016 O POS   Final  . Antibody Screen 10/21/2016 NEG   Final  . Sample Expiration 10/21/2016 10/24/2016   Final  . Extend sample reason 10/21/2016 NO TRANSFUSIONS OR PREGNANCY IN THE PAST 3 MONTHS   Final  . MRSA, PCR 10/15/2016 NEGATIVE  NEGATIVE Final  . Staphylococcus aureus 10/15/2016 NEGATIVE  NEGATIVE Final   Comment:        The Xpert SA Assay (FDA approved for NASAL  specimens in patients over 69 years of age), is one component of  a comprehensive surveillance program.  Test performance has been validated by [facility] for patients greater than or equal to 71 year old. It is not intended to diagnose infection nor to guide or monitor treatment.      X-Rays:Dg Pelvis Portable  Result Date: 10/21/2016 CLINICAL DATA:  Postoperative exam following right total hip joint replacement. EXAM: PORTABLE PELVIS 1-2 VIEWS COMPARISON:  Coronal and sagittal CT images from an abdominal and pelvic CT scan of Mar 05, 2014 FINDINGS: The patient has undergone right total hip joint prosthesis placement. Radiographic positioning of the prosthetic components is good. The interface with the native bone appears normal. A surgical drain line is present. IMPRESSION: No immediate complication following right total hip joint prosthesis placement. Electronically Signed   By: David  Martinique M.D.   On: 10/21/2016 10:45   Dg C-arm 1-60 Min-no Report  Result Date: 10/21/2016 There is no Radiologist interpretation  for this exam.   EKG: Orders placed or performed in visit on 07/29/16  . EKG 12-Lead     Hospital Course: Patient was admitted to [facility] and taken to the OR and underwent the above state procedure without complications.  Patient tolerated the procedure well and was later transferred to the recovery room and then to the orthopaedic floor for postoperative care.  They were given PO and IV analgesics for pain control following their surgery.  They were given 24 hours of postoperative antibiotics of  Anti-infectives    Start     Dose/Rate Route Frequency Ordered Stop   10/21/16 1400  ceFAZolin (ANCEF) IVPB 2g/100 mL premix     2 g 200 mL/hr over 30 Minutes Intravenous Every 6 hours 10/21/16 1139 10/21/16 2013   10/21/16 0619  ceFAZolin (ANCEF) IVPB 2g/100 mL premix     2 g 200 mL/hr over 30 Minutes Intravenous On call to O.R. 10/21/16 4920 10/21/16 0843      and started on DVT prophylaxis in the form of Xarelto.   PT and OT were ordered for total hip protocol.  The patient was allowed to be WBAT with therapy. Discharge planning was consulted to help with postop disposition and equipment needs.  Patient had a good night on the evening of surgery.  They started to get up OOB with therapy on day one. Continued to work with therapy into day two.  Dressing was checked and was clean and dry. Patient was seen in rounds on day one by Dr. Wynelle Link and was ready to go home as long as they met their therapy goals.  Addendum - He has some issues with getting up with therapy yesterday and unable to meet goals. Felt to have some nausea and dizziness due to the narcotics.  Switched the oxycodone to hydrocodone.  He did better last night and was better on POD 2.  Seen in rounds by Dr. Wynelle Link and was ready to go home.  Discharge home with home health Diet - Cardiac diet Follow up - in 2 weeks Activity - WBAT Disposition - Home Condition Upon Discharge - Good D/C Meds - See DC Summary DVT Prophylaxis - Xarelto  Discharge Instructions    Call MD / Call 911    Complete by:  As directed    If you experience chest pain or shortness of breath, CALL 911 and be transported to the hospital emergency room.  If you develope a fever above 101 F, pus (white drainage) or increased drainage or redness at the wound, or calf pain, call your  surgeon's office.   Call MD / Call 911    Complete by:  As directed    If you experience chest pain or shortness of breath, CALL 911 and be transported to the hospital emergency room.  If you develope a fever above 101 F, pus (white drainage) or increased drainage or redness at the wound, or calf pain, call your surgeon's office.   Change dressing    Complete by:  As directed    You may change your dressing dressing daily with sterile 4 x 4 inch gauze dressing and paper tape.  Do not submerge the incision under water.   Change dressing     Complete by:  As directed    You may change your dressing dressing daily with sterile 4 x 4 inch gauze dressing and paper tape.  Do not submerge the incision under water.   Constipation Prevention    Complete by:  As directed    Drink plenty of fluids.  Prune juice may be helpful.  You may use a stool softener, such as Colace (over the counter) 100 mg twice a day.  Use MiraLax (over the counter) for constipation as needed.   Constipation Prevention    Complete by:  As directed    Drink plenty of fluids.  Prune juice may be helpful.  You may use a stool softener, such as Colace (over the counter) 100 mg twice a day.  Use MiraLax (over the counter) for constipation as needed.   Diet - low sodium heart healthy    Complete by:  As directed    Diet - low sodium heart healthy    Complete by:  As directed    Diet Carb Modified    Complete by:  As directed    Discharge instructions    Complete by:  As directed    Pick up stool softner and laxative for home use following surgery while on pain medications. Do not submerge incision under water. Please use good hand washing techniques while changing dressing each day. May shower starting three days after surgery. Please use a clean towel to pat the incision dry following showers. Continue to use ice for pain and swelling after surgery. Do not use any lotions or creams on the incision until instructed by your surgeon.  Wear both TED hose on both legs during the day every day for three weeks, but may have off at night at home.  Postoperative Constipation Protocol  Constipation - defined medically as fewer than three stools per week and severe constipation as less than one stool per week.  One of the most common issues patients have following surgery is constipation.  Even if you have a regular bowel pattern at home, your normal regimen is likely to be disrupted due to multiple reasons following surgery.  Combination of anesthesia, postoperative  narcotics, change in appetite and fluid intake all can affect your bowels.  In order to avoid complications following surgery, here are some recommendations in order to help you during your recovery period.  Colace (docusate) - Pick up an over-the-counter form of Colace or another stool softener and take twice a day as long as you are requiring postoperative pain medications.  Take with a full glass of water daily.  If you experience loose stools or diarrhea, hold the colace until you stool forms back up.  If your symptoms do not get better within 1 week or if they get worse, check with your doctor.  Dulcolax (bisacodyl) -  Pick up over-the-counter and take as directed by the product packaging as needed to assist with the movement of your bowels.  Take with a full glass of water.  Use this product as needed if not relieved by Colace only.   MiraLax (polyethylene glycol) - Pick up over-the-counter to have on hand.  MiraLax is a solution that will increase the amount of water in your bowels to assist with bowel movements.  Take as directed and can mix with a glass of water, juice, soda, coffee, or tea.  Take if you go more than two days without a movement. Do not use MiraLax more than once per day. Call your doctor if you are still constipated or irregular after using this medication for 7 days in a row.  If you continue to have problems with postoperative constipation, please contact the office for further assistance and recommendations.  If you experience "the worst abdominal pain ever" or develop nausea or vomiting, please contact the office immediatly for further recommendations for treatment.   Take Xarelto for two and a half more weeks, then discontinue Xarelto. Once the patient has completed the blood thinner regimen, then take a Baby 81 mg Aspirin daily for three more weeks.   Discharge instructions    Complete by:  As directed    Pick up stool softner and laxative for home use following surgery  while on pain medications. Do not submerge incision under water. Please use good hand washing techniques while changing dressing each day. May shower starting three days after surgery. Please use a clean towel to pat the incision dry following showers. Continue to use ice for pain and swelling after surgery. Do not use any lotions or creams on the incision until instructed by your surgeon.  Wear both TED hose on both legs during the day every day for three weeks, but may have off at night at home.  Postoperative Constipation Protocol  Constipation - defined medically as fewer than three stools per week and severe constipation as less than one stool per week.  One of the most common issues patients have following surgery is constipation.  Even if you have a regular bowel pattern at home, your normal regimen is likely to be disrupted due to multiple reasons following surgery.  Combination of anesthesia, postoperative narcotics, change in appetite and fluid intake all can affect your bowels.  In order to avoid complications following surgery, here are some recommendations in order to help you during your recovery period.  Colace (docusate) - Pick up an over-the-counter form of Colace or another stool softener and take twice a day as long as you are requiring postoperative pain medications.  Take with a full glass of water daily.  If you experience loose stools or diarrhea, hold the colace until you stool forms back up.  If your symptoms do not get better within 1 week or if they get worse, check with your doctor.  Dulcolax (bisacodyl) - Pick up over-the-counter and take as directed by the product packaging as needed to assist with the movement of your bowels.  Take with a full glass of water.  Use this product as needed if not relieved by Colace only.   MiraLax (polyethylene glycol) - Pick up over-the-counter to have on hand.  MiraLax is a solution that will increase the amount of water in your  bowels to assist with bowel movements.  Take as directed and can mix with a glass of water, juice, soda, coffee, or tea.  Take if you go more than two days without a movement. Do not use MiraLax more than once per day. Call your doctor if you are still constipated or irregular after using this medication for 7 days in a row.  If you continue to have problems with postoperative constipation, please contact the office for further assistance and recommendations.  If you experience "the worst abdominal pain ever" or develop nausea or vomiting, please contact the office immediatly for further recommendations for treatment.   Take Xarelto for two and a half more weeks, then discontinue Xarelto. Once the patient has completed the blood thinner regimen, then take a Baby 81 mg Aspirin daily for three more weeks.   Do not sit on low chairs, stoools or toilet seats, as it may be difficult to get up from low surfaces    Complete by:  As directed    Do not sit on low chairs, stoools or toilet seats, as it may be difficult to get up from low surfaces    Complete by:  As directed    Driving restrictions    Complete by:  As directed    No driving until released by the physician.   Driving restrictions    Complete by:  As directed    No driving until released by the physician.   Increase activity slowly as tolerated    Complete by:  As directed    Increase activity slowly as tolerated    Complete by:  As directed    Lifting restrictions    Complete by:  As directed    No lifting until released by the physician.   Lifting restrictions    Complete by:  As directed    No lifting until released by the physician.   Patient may shower    Complete by:  As directed    You may shower without a dressing once there is no drainage.  Do not wash over the wound.  If drainage remains, do not shower until drainage stops.   Patient may shower    Complete by:  As directed    You may shower without a dressing once  there is no drainage.  Do not wash over the wound.  If drainage remains, do not shower until drainage stops.   TED hose    Complete by:  As directed    Use stockings (TED hose) for 3 weeks on both leg(s).  You may remove them at night for sleeping.   TED hose    Complete by:  As directed    Use stockings (TED hose) for 3 weeks on both leg(s).  You may remove them at night for sleeping.   Weight bearing as tolerated    Complete by:  As directed    Laterality:  right   Extremity:  Lower   Weight bearing as tolerated    Complete by:  As directed    Laterality:  right   Extremity:  Lower     Allergies as of 10/23/2016      Reactions   Simvastatin    Joint pain.     Sulfamethoxazole    REACTION: hives      Medication List    STOP taking these medications   clomiPHENE 50 MG tablet Commonly known as:  CLOMID   HYDROcodone-acetaminophen 10-325 MG tablet Commonly known as:  NORCO Replaced by:  HYDROcodone-acetaminophen 5-325 MG tablet   meloxicam 15 MG tablet Commonly known as:  MOBIC   sildenafil 50 MG  tablet Commonly known as:  VIAGRA     TAKE these medications   acyclovir 200 MG capsule Commonly known as:  ZOVIRAX Take 1 capsule (200 mg total) by mouth 5 (five) times daily. Fever blisters   diphenhydrAMINE 25 MG tablet Commonly known as:  BENADRYL Take 25 mg by mouth at bedtime as needed for sleep. For sleep.   ezetimibe 10 MG tablet Commonly known as:  ZETIA Take 1 tablet (10 mg total) by mouth daily.   HYDROcodone-acetaminophen 5-325 MG tablet Commonly known as:  NORCO/VICODIN Take 1-2 tablets by mouth every 4 (four) hours as needed for moderate pain. Replaces:  HYDROcodone-acetaminophen 10-325 MG tablet   ketoconazole 2 % cream Commonly known as:  NIZORAL Apply 1 application topically 2 (two) times daily.   levothyroxine 150 MCG tablet Commonly known as:  SYNTHROID, LEVOTHROID Take 1 tablet (150 mcg total) by mouth daily.   losartan-hydrochlorothiazide  100-25 MG tablet Commonly known as:  HYZAAR Take 1 tablet by mouth daily.   methocarbamol 500 MG tablet Commonly known as:  ROBAXIN Take 1 tablet (500 mg total) by mouth every 6 (six) hours as needed for muscle spasms. What changed:  when to take this  reasons to take this   metoprolol succinate 50 MG 24 hr tablet Commonly known as:  TOPROL-XL Take 1 tablet (50 mg total) by mouth daily. Take with or immediately following a meal. What changed:  when to take this  additional instructions   mometasone 0.1 % cream Commonly known as:  ELOCON Apply topically as needed.   POTASSIUM GLUCONATE PO Take 1,190 mg by mouth 4 (four) times a week. Monday, Wednesday, Friday, Sunday   rivaroxaban 10 MG Tabs tablet Commonly known as:  XARELTO Take 1 tablet (10 mg total) by mouth daily with breakfast. Take Xarelto for two and a half more weeks, then discontinue Xarelto. Once the patient has completed the blood thinner regimen, then take a Baby 81 mg Aspirin daily for three more weeks.   STOOL SOFTENER LAXATIVE PO Take by mouth. 3 times weekly- Tuesday, Thursday, Saturday   traMADol 50 MG tablet Commonly known as:  ULTRAM Take 1-2 tablets (50-100 mg total) by mouth every 6 (six) hours as needed for moderate pain.   TYLENOL ARTHRITIS PAIN 650 MG CR tablet Generic drug:  acetaminophen Take 650 mg by mouth every 8 (eight) hours as needed for pain.      Follow-up Information    [name], MD. Schedule an appointment as soon as possible for a visit in 2 week(s).   Specialty:  Orthopedic Surgery Contact information: [address] [place] [address] [phone]        [facility] Follow up.   Specialty:  [place] Why:  home health physical therapy Contact information: [place] [phone]           Signed: Arlee Muslim, PA-C Orthopaedic Surgery 10/23/2016, 8:15 AM

## 2016-10-23 ENCOUNTER — Other Ambulatory Visit: Payer: Self-pay | Admitting: Endocrinology

## 2016-10-23 LAB — BASIC METABOLIC PANEL
Anion gap: 10 (ref 5–15)
BUN: 14 mg/dL (ref 6–20)
CHLORIDE: 103 mmol/L (ref 101–111)
CO2: 28 mmol/L (ref 22–32)
CREATININE: 0.84 mg/dL (ref 0.61–1.24)
Calcium: 8.7 mg/dL — ABNORMAL LOW (ref 8.9–10.3)
GFR calc non Af Amer: >60 mL/min (ref >60)
Glucose, Bld: 117 mg/dL — ABNORMAL HIGH (ref 65–99)
Potassium: 4 mmol/L (ref 3.5–5.1)
Sodium: 141 mmol/L (ref 135–145)

## 2016-10-23 LAB — CBC
HEMATOCRIT: 31.1 % — AB (ref 39.0–52.0)
HEMOGLOBIN: 10.4 g/dL — AB (ref 13.0–17.0)
MCH: 28.6 pg (ref 26.0–34.0)
MCHC: 33.4 g/dL (ref 30.0–36.0)
MCV: 85.4 fL (ref 78.0–100.0)
Platelets: 238 10*3/uL (ref 150–400)
RBC: 3.64 MIL/uL — ABNORMAL LOW (ref 4.22–5.81)
RDW: 13.7 % (ref 11.5–15.5)
WBC: 13.3 10*3/uL — ABNORMAL HIGH (ref 4.0–10.5)

## 2016-10-23 MED ORDER — HYDROCODONE-ACETAMINOPHEN 5-325 MG PO TABS: 1.0000 | ORAL_TABLET | ORAL | 0 refills | Status: DC | PRN

## 2016-10-23 MED ORDER — TRAMADOL HCL 50 MG PO TABS: 50.0000 mg | ORAL_TABLET | Freq: Four times a day (QID) | ORAL | 0 refills | Status: DC | PRN

## 2016-10-23 MED ORDER — METHOCARBAMOL 500 MG PO TABS: 500.0000 mg | ORAL_TABLET | Freq: Four times a day (QID) | ORAL | 0 refills | Status: DC | PRN

## 2016-10-23 MED ORDER — RIVAROXABAN 10 MG PO TABS: 10.0000 mg | ORAL_TABLET | Freq: Every day | ORAL | 0 refills | Status: DC

## 2016-10-23 MED ORDER — CLOMIPHENE CITRATE 50 MG PO TABS: 25.0000 mg | ORAL_TABLET | ORAL | 1 refills | Status: DC

## 2016-10-23 NOTE — Progress Notes (Signed)
Occupational Therapy Treatment Patient Details Name: Luke Skinner MRN: [id number] DOB: September 13, 1956 Today's Date: 10/23/2016    History of present illness s/p R DA THA   OT comments  Pt ready to DC from OT standpoint  Follow Up Recommendations  No OT follow up    Equipment Recommendations  None recommended by OT    Recommendations for Other Services      Precautions / Restrictions Restrictions Weight Bearing Restrictions: No       Mobility Bed Mobility Overal bed mobility: Needs Assistance Bed Mobility: Supine to Sit     Supine to sit: Min guard Sit to supine: Min guard   General bed mobility comments: Pt much improved this day  Transfers Overall transfer level: Needs assistance   Transfers: Sit to/from Stand Sit to Stand: Supervision Stand pivot transfers: Supervision       General transfer comment: Vc for hand placement    Balance                                   ADL Overall ADL's : Needs assistance/impaired                                       General ADL Comments: Pt declined practice but verbalized understanding and technique of LB dressing, tub transfer with bench and toilet transfer. Pt verablized understanding of AE - pt has if needed.  Pt verbalized not liking the sock aid and his wife would perform but she also just had surgery                Cognition   Behavior During Therapy: WFL for tasks assessed/performed Overall Cognitive Status: Within Functional Limits for tasks assessed                                    Pertinent Vitals/ Pain       Pain Score: 2  Pain Location:  R hip  Pain Descriptors / Indicators: Tender Pain Intervention(s): Monitored during session         Frequency  Min 2X/week        Progress Toward Goals  OT Goals(current goals can now be found in the care plan section)  Progress towards OT goals: Progressing toward goals     Plan Discharge plan remains  appropriate    Co-evaluation                 End of Session Equipment Utilized During Treatment: Rolling walker   Activity Tolerance Patient tolerated treatment well   Patient Left in bed   Nurse Communication Mobility status        Time: 1140-1151 OT Time Calculation (min): 11 min  Charges: OT General Charges $OT Visit: 1 Procedure OT Treatments $Self Care/Home Management : 8-22 mins  Benson, Thereasa Parkin 10/23/2016, 12:47 PM

## 2016-10-23 NOTE — Progress Notes (Signed)
Physical Therapy Treatment Patient Details Name: Luke Skinner MRN: [id number] DOB: 09-Apr-1956 Today's Date: 10/23/2016    History of Present Illness s/p R DA THA    PT Comments    POD # 2 Demonstrated and instructed pt how to use a belt to assist R LE off bed.  Assisted with amb to bathroom with instruction on safety using walker in tight spaces.   Follow Up Recommendations  Home health PT     Equipment Recommendations  None recommended by PT    Recommendations for Other Services       Precautions / Restrictions Precautions Precautions: Fall Restrictions Weight Bearing Restrictions: No Other Position/Activity Restrictions: WBAT    Mobility  Bed Mobility Overal bed mobility: Needs Assistance Bed Mobility: Supine to Sit     Supine to sit: Min guard Sit to supine: Min guard   General bed mobility comments: demonstrated and instructed how to use a belt to self assist R LE OOB and onto bed  Transfers Overall transfer level: Needs assistance Equipment used: Rolling walker (2 wheeled) Transfers: Sit to/from Stand Sit to Stand: Supervision Stand pivot transfers: Supervision       General transfer comment: Vc for hand placement with increased time  Ambulation/Gait Ambulation/Gait assistance: Supervision;Min guard Ambulation Distance (Feet): 8 Feet Assistive device: Rolling walker (2 wheeled) Gait Pattern/deviations: Step-to pattern;Decreased stance time - right Gait velocity: decreased   General Gait Details: <25% VC's on safety with turns and using walker in tight spaces as assisted pt to bathroom   Stairs            Wheelchair Mobility    Modified Rankin (Stroke Patients Only)       Balance                                    Cognition Arousal/Alertness: Awake/alert Behavior During Therapy: WFL for tasks assessed/performed Overall Cognitive Status: Within Functional Limits for tasks assessed                       Exercises      General Comments        Pertinent Vitals/Pain Pain Assessment: 0-10 Pain Score: 4  Pain Location: R hip Pain Descriptors / Indicators: Sore;Tender;Tightness Pain Intervention(s): Monitored during session;Repositioned;Ice applied    Home Living                      Prior Function            PT Goals (current goals can now be found in the care plan section) Progress towards PT goals: Progressing toward goals    Frequency    7X/week      PT Plan Current plan remains appropriate    Co-evaluation             End of Session Equipment Utilized During Treatment: Gait belt Activity Tolerance: Patient tolerated treatment well Patient left: Other (comment) (bathroom)     Time: [id number] PT Time Calculation (min) (ACUTE ONLY): 12 min  Charges:  $Gait Training: 8-22 mins                    G Codes:      Rica Koyanagi  PTA [facility]  Acute  Rehab Pager      [phone]

## 2016-10-23 NOTE — Progress Notes (Signed)
Pt to d/c home with [facility]. No DME needs. AVS reviewed and "My Chart" discussed with pt. Pt capable of verbalizing medications, dressing changes, signs and symptoms of infection, and follow-up appointments. Remains hemodynamically stable. No signs and symptoms of distress. Educated pt to return to ER in the case of SOB, dizziness, or chest pain.

## 2016-10-23 NOTE — Progress Notes (Signed)
   Subjective: 2 Days Post-Op Procedure(s) (LRB): RIGHT TOTAL HIP ARTHROPLASTY ANTERIOR APPROACH (Right) Patient reports pain as mild.   Patient seen in rounds with Dr. Wynelle Link.  Doing better with the medication change. Patient is well, but has had some minor complaints of pain in the knee, requiring pain medications Patient is ready to go home  Objective: Vital signs in last 24 hours: Temp:  [97.8 F (36.6 C)-98.6 F (37 C)] 98.2 F (36.8 C) (01/05 0613) Pulse Rate:  [65-72] 65 (01/05 0613) Resp:  [16] 16 (01/05 0613) BP: (110-158)/(39-63) 154/63 (01/05 0613) SpO2:  [96 %-98 %] 98 % (01/05 0613)  Intake/Output from previous day:  Intake/Output Summary (Last 24 hours) at 10/23/16 0800 Last data filed at 10/23/16 0137  Gross per 24 hour  Intake             1200 ml  Output             3025 ml  Net            -1825 ml    Intake/Output this shift: No intake/output data recorded.  Labs:  Recent Labs  10/22/16 0447 10/23/16 0437  HGB 10.8* 10.4*    Recent Labs  10/22/16 0447 10/23/16 0437  WBC 14.3* 13.3*  RBC 3.85* 3.64*  HCT 32.9* 31.1*  PLT 224 238    Recent Labs  10/22/16 0447 10/23/16 0437  NA 138 141  K 4.0 4.0  CL 104 103  CO2 26 28  BUN 16 14  CREATININE 0.95 0.84  GLUCOSE 128* 117*  CALCIUM 8.4* 8.7*   No results for input(s): LABPT, INR in the last 72 hours.  EXAM: General - Patient is Alert, Appropriate and Oriented Extremity - Neurovascular intact Sensation intact distally Intact pulses distally Dorsiflexion/Plantar flexion intact Incision - clean, dry, no drainage Motor Function - intact, moving foot and toes well on exam.   Assessment/Plan: 2 Days Post-Op Procedure(s) (LRB): RIGHT TOTAL HIP ARTHROPLASTY ANTERIOR APPROACH (Right) Procedure(s) (LRB): RIGHT TOTAL HIP ARTHROPLASTY ANTERIOR APPROACH (Right) Past Medical History:  Diagnosis Date  . Allergy   . Arthritis    oa  . Herpes stomatitis    fever blisters occasional  .  History of kidney stones   . Hyperlipidemia   . Hypertension   . Hypothyroidism   . Pneumonia 2006 last time   several times   . Rash    below both knees for many years uses selsum blue to wash with   Principal Problem:   OA (osteoarthritis) of hip  Estimated body mass index is 36.03 kg/m as calculated from the following:   Height as of this encounter: 5\' 9"  (1.753 m).   Weight as of this encounter: 110.7 kg (244 lb). Up with therapy Discharge home with home health Diet - Cardiac diet and Renal diet Follow up - in 2 weeks Activity - WBAT Disposition - Home Condition Upon Discharge - Good D/C Meds - See DC Summary DVT Prophylaxis - Xarelto  Arlee Muslim, PA-C Orthopaedic Surgery 10/23/2016, 8:00 AM

## 2016-10-23 NOTE — Progress Notes (Signed)
Physical Therapy Treatment Patient Details Name: Luke Skinner MRN: [id number] DOB: Jul 15, 1956 Today's Date: 10/23/2016    History of Present Illness s/p R DA THA    PT Comments    POD # 2 Assisted out of bathroom to amb in hallway and practice one step.  Also given a handout on HEP and performed all supine TE's and demonstrated standing TE's.  Instructed on proper tech, freq as well as use of ICE.   Follow Up Recommendations  Home health PT      Equipment Recommendations  None recommended by PT    Recommendations for Other Services       Precautions / Restrictions Precautions Precautions: Fall Restrictions Weight Bearing Restrictions: No Other Position/Activity Restrictions: WBAT    Mobility  Bed Mobility Overal bed mobility: Needs Assistance Bed Mobility: Supine to Sit     Supine to sit: Min guard Sit to supine: Min guard   General bed mobility comments: demonstrated and instructed how to use a belt to self assist R LE OOB and onto bed  Transfers Overall transfer level: Needs assistance Equipment used: Rolling walker (2 wheeled) Transfers: Sit to/from Stand Sit to Stand: Supervision Stand pivot transfers: Supervision       General transfer comment: Vc for hand placement with increased time  Ambulation/Gait Ambulation/Gait assistance: Supervision;Min guard Ambulation Distance (Feet): 8 Feet Assistive device: Rolling walker (2 wheeled) Gait Pattern/deviations: Step-to pattern;Decreased stance time - right Gait velocity: decreased   General Gait Details: <25% VC's on safety with turns and using walker in tight spaces as assisted pt to bathroom   Stairs Stairs: Yes   Stair Management: No rails;Step to pattern;Forwards Number of Stairs: 1 General stair comments: <25% VC's on proper tech and sequencing plus walker placement  Wheelchair Mobility    Modified Rankin (Stroke Patients Only)       Balance                                    Cognition Arousal/Alertness: Awake/alert Behavior During Therapy: WFL for tasks assessed/performed Overall Cognitive Status: Within Functional Limits for tasks assessed                      Exercises      General Comments        Pertinent Vitals/Pain Pain Assessment: 0-10 Pain Score: 4  Pain Location: R hip Pain Descriptors / Indicators: Sore;Tender;Tightness Pain Intervention(s): Monitored during session;Repositioned;Ice applied    Home Living                      Prior Function            PT Goals (current goals can now be found in the care plan section) Progress towards PT goals: Progressing toward goals    Frequency    7X/week      PT Plan Current plan remains appropriate    Co-evaluation             End of Session Equipment Utilized During Treatment: Gait belt Activity Tolerance: Patient tolerated treatment well Patient left: Other (comment) (bathroom)     Time: [id number] PT Time Calculation (min) (ACUTE ONLY): 25 min  Charges:  $Gait Training: 8-22 mins $Therapeutic Exercise: 8-22 mins                    G Codes:      Cecille Rubin  Tirza Senteno  PTA [facility]  Acute  Rehab Pager      [phone]

## 2016-11-18 ENCOUNTER — Other Ambulatory Visit (INDEPENDENT_AMBULATORY_CARE_PROVIDER_SITE_OTHER): Payer: 59

## 2016-11-18 DIAGNOSIS — E038 Other specified hypothyroidism: Secondary | ICD-10-CM

## 2016-11-18 DIAGNOSIS — E291 Testicular hypofunction: Secondary | ICD-10-CM

## 2016-11-18 DIAGNOSIS — E063 Autoimmune thyroiditis: Secondary | ICD-10-CM

## 2016-11-18 LAB — TSH: TSH: 2.28 u[IU]/mL (ref 0.35–4.50)

## 2016-11-18 LAB — TESTOSTERONE: Testosterone: 555 ng/dL (ref 300.00–890.00)

## 2016-11-18 LAB — T4, FREE: FREE T4: 0.95 ng/dL (ref 0.60–1.60)

## 2016-11-18 LAB — GLUCOSE, RANDOM: Glucose, Bld: 113 mg/dL — ABNORMAL HIGH (ref 70–99)

## 2016-11-20 LAB — TESTOSTERONE, FREE, TOTAL, SHBG
Sex Hormone Binding: 83.4 nmol/L — ABNORMAL HIGH (ref 19.3–76.4)
Testosterone, Free: 10.9 pg/mL (ref 6.6–18.1)
Testosterone: 775 ng/dL (ref 264–916)

## 2016-11-23 ENCOUNTER — Encounter: Payer: Self-pay | Admitting: Endocrinology

## 2016-11-23 ENCOUNTER — Ambulatory Visit (INDEPENDENT_AMBULATORY_CARE_PROVIDER_SITE_OTHER): Payer: 59 | Admitting: Endocrinology

## 2016-11-23 VITALS — BP 126/72 | HR 65 | Ht 69.0 in | Wt 243.0 lb

## 2016-11-23 DIAGNOSIS — R7301 Impaired fasting glucose: Secondary | ICD-10-CM | POA: Diagnosis not present

## 2016-11-23 DIAGNOSIS — E291 Testicular hypofunction: Secondary | ICD-10-CM | POA: Diagnosis not present

## 2016-11-23 DIAGNOSIS — E038 Other specified hypothyroidism: Secondary | ICD-10-CM

## 2016-11-23 DIAGNOSIS — E063 Autoimmune thyroiditis: Secondary | ICD-10-CM

## 2016-11-23 NOTE — Patient Instructions (Signed)
Same Rx 

## 2016-11-23 NOTE — Progress Notes (Signed)
Patient ID: Luke Skinner, male   DOB: 1956-08-16, 61 y.o.   MRN: [id number]            Reason for visit: Follow-up of testosterone and thyroid    History of Present Illness:   Hypothyroidism was first diagnosed  when he was in his 88s. At the time of diagnosis patient was having symptoms of fatigue, difficulty with weight loss but does not remember other symptoms  The patient has been treated with  levothyroxine 150 g for most of the duration of his hypothyroidism With starting thyroid supplementation the patient's symptoms had improved  He is taking his thyroid supplement quite regularly in the morning and takes his multivitamin later in the day Previously with taking the 2 together he had a relatively higher TSH No unusual fatigue, has a little difficulty sleeping because of hip pain after surgery     Patient's weight history is as follows:   Wt Readings from Last 3 Encounters:  11/23/16 243 lb (110.2 kg)  10/21/16 244 lb (110.7 kg)  10/15/16 244 lb (110.7 kg)    His TSH level has been consistently normal     Lab Results  Component Value Date   TSH 2.28 11/18/2016   TSH 1.79 07/22/2016   TSH 3.18 05/07/2016   FREET4 0.95 11/18/2016   FREET4 1.05 05/07/2016   FREET4 1.00 12/04/2015    Other problems:  History of HYPOGONADISM:  He was having significant amount of fatigue, lack of energy in 2011 and because of his low testosterone level he was given AndroGel. He does not know if he felt better with this as he did not take it for long.  He stopped it apparently because his PSA went up to 4.5  He was seen in consultation because of symptoms of feeling more tired and has lack of energy preventing him from doing a lot of things He thinks he has some difficulty losing weight  Also has had long-standing erectile dysfunction of unclear etiology  Although his total testosterone was normal his free testosterone was low at 5.6 Since his LH and prolactin level and  baseline were in the normal range he was started on a trial of clomiphene half tablet 3 times a week of 50 mg  Recent history: He says he feels overall better with his energy level  A little tired recently because of not sleeping well after hip surgery Weight is stable Currently on clomiphene half tablet twice a week  Free testosterone level and also total testosterone quite stable and normal again   Lab Results  Component Value Date   TESTOSTERONE 555.00 11/18/2016   TESTOSTERONE 775 11/18/2016   TESTOSTERONE 428 05/07/2016   TESTOSTERONE 500 01/10/2016    Lab on 11/18/2016  Component Date Value Ref Range Status  . Glucose, Bld 11/18/2016 113* 70 - 99 mg/dL Final  . Testosterone 11/18/2016 555.00  300.00 - 890.00 ng/dL Final  . Free T4 11/18/2016 0.95  0.60 - 1.60 ng/dL Final   Comment: Specimens from patients who are undergoing biotin therapy and /or ingesting biotin supplements may contain high levels of biotin.  The higher biotin concentration in these specimens interferes with this Free T4 assay.  Specimens that contain high levels  of biotin may cause false high results for this Free T4 assay.  Please interpret results in light of the total clinical presentation of the patient.    Marland Kitchen TSH 11/18/2016 2.28  0.35 - 4.50 uIU/mL Final  . Testosterone 11/18/2016 775  264 - 916 ng/dL Final   Comment: Adult male reference interval is based on a population of healthy nonobese males (BMI <30) between 71 and 57 years old. [place], [place] [phone]. PMID: [id number].   Marland Kitchen Testosterone, Free 11/18/2016 10.9  6.6 - 18.1 pg/mL Final  . Sex Hormone Binding 11/18/2016 83.4* 19.3 - 76.4 nmol/L Final      Past Medical History:  Diagnosis Date  . Allergy   . Arthritis    oa  . Herpes stomatitis    fever blisters occasional  . History of kidney stones   . Hyperlipidemia   . Hypertension   . Hypothyroidism   . Pneumonia 2006 last time   several times   . Rash    below  both knees for many years uses selsum blue to wash with    Past Surgical History:  Procedure Laterality Date  . COLONOSCOPY  12-09-10   per Dr. Henrene Pastor, repeat in 10 yrs  . ESOPHAGUS SURGERY     as infant   . GASTROSTOMY  1989   per Dr. Annamaria Boots  . HERNIA REPAIR     left inguinal   . KNEE ARTHROSCOPY Left 1973, 1975, 1990 and 2003  . NASAL SEPTUM SURGERY    . SHOULDER ARTHROSCOPY  2005   on right per Dr. Wynelle Link  . TOTAL HIP ARTHROPLASTY Right 10/21/2016   Procedure: RIGHT TOTAL HIP ARTHROPLASTY ANTERIOR APPROACH;  Surgeon: Gaynelle Arabian, MD;  Location: [facility];  Service: Orthopedics;  Laterality: Right;  . TOTAL KNEE ARTHROPLASTY  2008   on left per Dr. Wynelle Link    Family History  Problem Relation Age of Onset  . Heart disease Mother   . Thyroid disease Mother   . Parkinsonism Father   . Thyroid disease Sister   . Diabetes Maternal Uncle     Social History:  reports that he quit smoking about 18 years ago. His smoking use included Cigarettes. He has a 14.00 pack-year smoking history. He has never used smokeless tobacco. He reports that he does not drink alcohol or use drugs.  Allergies:  Allergies  Allergen Reactions  . Simvastatin     Joint pain.    . Sulfamethoxazole     REACTION: hives    Allergies as of 11/23/2016      Reactions   Simvastatin    Joint pain.     Sulfamethoxazole    REACTION: hives      Medication List       Accurate as of 11/23/16  8:20 AM. Always use your most recent med list.          acyclovir 200 MG capsule Commonly known as:  ZOVIRAX Take 1 capsule (200 mg total) by mouth 5 (five) times daily. Fever blisters   clomiPHENE 50 MG tablet Commonly known as:  CLOMID Take 0.5 tablets (25 mg total) by mouth 2 (two) times a week.   diphenhydrAMINE 25 MG tablet Commonly known as:  BENADRYL Take 25 mg by mouth at bedtime as needed for sleep. For sleep.   ezetimibe 10 MG tablet Commonly known as:  ZETIA Take 1 tablet (10 mg total) by mouth  daily.   HYDROcodone-acetaminophen 5-325 MG tablet Commonly known as:  NORCO/VICODIN Take 1-2 tablets by mouth every 4 (four) hours as needed for moderate pain.   ketoconazole 2 % cream Commonly known as:  NIZORAL Apply 1 application topically 2 (two) times daily.   levothyroxine 150 MCG tablet Commonly known as:  SYNTHROID, LEVOTHROID Take 1  tablet (150 mcg total) by mouth daily.   losartan-hydrochlorothiazide 100-25 MG tablet Commonly known as:  HYZAAR Take 1 tablet by mouth daily.   methocarbamol 500 MG tablet Commonly known as:  ROBAXIN Take 1 tablet (500 mg total) by mouth every 6 (six) hours as needed for muscle spasms.   metoprolol succinate 50 MG 24 hr tablet Commonly known as:  TOPROL-XL Take 1 tablet (50 mg total) by mouth daily. Take with or immediately following a meal.   mometasone 0.1 % cream Commonly known as:  ELOCON Apply topically as needed.   POTASSIUM GLUCONATE PO Take 1,190 mg by mouth 4 (four) times a week. Monday, Wednesday, Friday, Sunday   rivaroxaban 10 MG Tabs tablet Commonly known as:  XARELTO Take 1 tablet (10 mg total) by mouth daily with breakfast. Take Xarelto for two and a half more weeks, then discontinue Xarelto. Once the patient has completed the blood thinner regimen, then take a Baby 81 mg Aspirin daily for three more weeks.   STOOL SOFTENER LAXATIVE PO Take by mouth. 3 times weekly- Tuesday, Thursday, Saturday   traMADol 50 MG tablet Commonly known as:  ULTRAM Take 1-2 tablets (50-100 mg total) by mouth every 6 (six) hours as needed for moderate pain.   TYLENOL ARTHRITIS PAIN 650 MG CR tablet Generic drug:  acetaminophen Take 650 mg by mouth every 8 (eight) hours as needed for pain.        Review of Systems      His fasting glucose was 101,Now 113 Previously A1c stable at 6.2 He says he is starting to watch his diet and he hit healthy and Also after hip replacement is going to start working out at a gym Discussed A1c  and fasting glucose targets   Lab Results  Component Value Date   HGBA1C 6.2 05/07/2016   HGBA1C 6.2 01/10/2016   HGBA1C 5.7 12/08/2006   Lab Results  Component Value Date   LDLCALC 135 (H) 12/04/2015   CREATININE 0.84 10/23/2016     Examination:    BP 126/72   Pulse 65   Ht 5\' 9"  (1.753 m)   Wt 243 lb (110.2 kg)   SpO2 94%   BMI 35.88 kg/m      Assessment:  HYPOGONADOTROPIC hypogonadism   He does have good response to low-dose clomiphene 25 mg twice a week with improved energy level and overall less fatigue Currently still having some fatigue related to recent hip surgery and continued pain  Testosterone level is excellent and stable  HYPOTHYROIDISM, long-standing and secondary to autoimmune disease with strong family History  His TSH is consistently normal now with his 150 g of levothyroxine  IMPAIRED fasting glucose: A1c is stable at 6.2, needs weight loss.  Fasting glucose now 113, higher than before  PLAN:   No change in clomiphene twice a week  Follow-up in 6 months with repeat testosterone  Continue same dose of levothyroxine 150 g  Weight loss measures such as exercise and improved diet needed because of worsening impaired fasting glucose  A1c to be checked by PCP on his physical    Breuna Loveall 11/23/2016, 8:20 AM

## 2016-12-08 ENCOUNTER — Encounter: Payer: Self-pay | Admitting: Family Medicine

## 2016-12-08 ENCOUNTER — Ambulatory Visit (INDEPENDENT_AMBULATORY_CARE_PROVIDER_SITE_OTHER): Payer: 59 | Admitting: Family Medicine

## 2016-12-08 VITALS — BP 168/82 | HR 77 | Temp 98.4°F | Ht 69.0 in | Wt 242.3 lb

## 2016-12-08 DIAGNOSIS — J209 Acute bronchitis, unspecified: Secondary | ICD-10-CM | POA: Diagnosis not present

## 2016-12-08 MED ORDER — MOMETASONE FUROATE 0.1 % EX CREA: TOPICAL_CREAM | CUTANEOUS | 11 refills | Status: DC | PRN

## 2016-12-08 MED ORDER — HYDROCODONE-HOMATROPINE 5-1.5 MG/5ML PO SYRP: 5.0000 mL | ORAL_SOLUTION | ORAL | 0 refills | Status: DC | PRN

## 2016-12-08 MED ORDER — AZITHROMYCIN 250 MG PO TABS: ORAL_TABLET | ORAL | 0 refills | Status: DC

## 2016-12-08 MED ORDER — KETOCONAZOLE 2 % EX CREA: 1.0000 "application " | TOPICAL_CREAM | Freq: Two times a day (BID) | CUTANEOUS | 5 refills | Status: DC

## 2016-12-08 NOTE — Progress Notes (Signed)
   Subjective:    Patient ID: Luke Skinner, male    DOB: 05-Jun-1956, 61 y.o.   MRN: [id number]  HPI Here for 4 days of chest congestion and coughing up green sputum. No fever.    Review of Systems  Constitutional: Negative.   HENT: Positive for congestion. Negative for postnasal drip, sinus pain, sinus pressure and sore throat.   Eyes: Negative.   Respiratory: Positive for cough and chest tightness.        Objective:   Physical Exam  Constitutional: He appears well-developed and well-nourished.  HENT:  Right Ear: External ear normal.  Left Ear: External ear normal.  Nose: Nose normal.  Mouth/Throat: Oropharynx is clear and moist.  Eyes: Conjunctivae are normal.  Neck: No thyromegaly present.  Pulmonary/Chest: Effort normal. No respiratory distress. He has no wheezes. He has no rales.  Scattered rhonchi   Lymphadenopathy:    He has no cervical adenopathy.          Assessment & Plan:  Bronchitis, treat with a Zpack  Alysia Penna, MD

## 2016-12-08 NOTE — Progress Notes (Signed)
Pre visit review using our clinic review tool, if applicable. No additional management support is needed unless otherwise documented below in the visit note. 

## 2017-03-02 ENCOUNTER — Ambulatory Visit: Payer: 59 | Admitting: Internal Medicine

## 2017-03-17 ENCOUNTER — Encounter: Payer: Self-pay | Admitting: Internal Medicine

## 2017-03-17 ENCOUNTER — Ambulatory Visit (INDEPENDENT_AMBULATORY_CARE_PROVIDER_SITE_OTHER): Payer: 59 | Admitting: Internal Medicine

## 2017-03-17 VITALS — BP 126/80 | HR 62 | Ht 69.0 in | Wt 248.0 lb

## 2017-03-17 DIAGNOSIS — R5383 Other fatigue: Secondary | ICD-10-CM

## 2017-03-17 DIAGNOSIS — N529 Male erectile dysfunction, unspecified: Secondary | ICD-10-CM | POA: Diagnosis not present

## 2017-03-17 DIAGNOSIS — I10 Essential (primary) hypertension: Secondary | ICD-10-CM

## 2017-03-17 NOTE — Patient Instructions (Signed)
Your physician wants you to follow-up in: ONE YEAR with Dr. Hilty. You will receive a reminder letter in the mail two months in advance. If you don't receive a letter, please call our office to schedule the follow-up appointment.  

## 2017-03-17 NOTE — Progress Notes (Signed)
OFFICE NOTE  Chief Complaint:  Occasional "swimmy headedness"  Primary Care Physician: Laurey Morale, MD  HPI:  Luke Skinner is a pleasant 61 year old male who was kindly referred to me by Dr. Sarajane Jews for evaluation of a labile hypertension and dyspnea. He recently had changes in his blood pressure medicine including the addition of Toprol-XL 50 mg daily. He had reported some improvement in his blood pressures on this however was having problems with diastolic hypotension. His losartan was then decreased from 100 mg daily to 50 mg daily. Blood pressure then ran higher up in the 790W to 409 systolic. For a few days he took his wife's HCTZ and reported blood pressures in the 735H to 299 systolic. He has been having some dizziness and thinks this is related to blood pressure changes. He also reports some worsening fatigue and exercise intolerance, although denies any chest pain. He has difficult sleep at night and often times dozes off during the day. His Epworth sleepiness scale score was 10, suggesting the possibility of sleep apnea. He says that he had a sleep study in 2008 and this was negative for apnea, but did report that he does not get into REM sleep. An EKG in the office today is abnormal demonstrating anterolateral T-wave inversions in sinus bradycardia 55.  I saw Luke Skinner in the office today for follow-up of his stress test. This was negative for ischemia. Notably with the change in his medications including adding back his HCTZ, the blood pressure seems to be better controlled. He is currently on losartan 50 mg (one half tablet and (daily, HCTZ 25 mg daily and Toprol-XL 50 mg which she takes at night. He denies any worsening chest pain or shortness of breath.  Luke Skinner returns today for follow-up. We have further adjusted his blood pressure medicine and currently he is on the Hyzaar 10/25 and Toprol XL 50 mg daily. He reports his blood pressures at home range between 242 and  683 systolic over 41D. Overall he is feeling very well. Blood pressure today in the office was 130/72.  03/17/2017  Luke Skinner was seen in follow-up today. He reports some lability to his blood pressure but otherwise has been fairly well controlled. Today's 127/80. He is on metoprolol 50 mg daily at bedtime and he takes losartan HCTZ in the morning. He says that he notes sometimes in the morning he gets swimmy headed. Is not clearly associated with consistent medication use. He denies any chest pain or worsening shortness of breath. He does have some fatigue and is on treatment for low testosterone. He also inquired today about medications for erectile dysfunction. He says in the past he's used Viagra, though recently was given low-dose Revatio, but reports that after taking 4 tablets (80 mg) he felt his heart racing and was rather uncomfortable. I suspect this may be due to low blood pressure.  PMHx:  Past Medical History:  Diagnosis Date  . Allergy   . Arthritis    oa  . Herpes stomatitis    fever blisters occasional  . History of kidney stones   . Hyperlipidemia   . Hypertension   . Hypothyroidism   . Pneumonia 2006 last time   several times   . Rash    below both knees for many years uses selsum blue to wash with    Past Surgical History:  Procedure Laterality Date  . COLONOSCOPY  12-09-10   per Dr. Henrene Pastor, repeat in 10 yrs  .  ESOPHAGUS SURGERY     as infant   . GASTROSTOMY  1989   per Dr. Annamaria Boots  . HERNIA REPAIR     left inguinal   . KNEE ARTHROSCOPY Left 1973, 1975, 1990 and 2003  . NASAL SEPTUM SURGERY    . SHOULDER ARTHROSCOPY  2005   on right per Dr. Wynelle Link  . TOTAL HIP ARTHROPLASTY Right 10/21/2016   Procedure: RIGHT TOTAL HIP ARTHROPLASTY ANTERIOR APPROACH;  Surgeon: Gaynelle Arabian, MD;  Location: [facility];  Service: Orthopedics;  Laterality: Right;  . TOTAL KNEE ARTHROPLASTY  2008   on left per Dr. Wynelle Link    FAMHx:  Family History  Problem Relation Age of Onset    . Heart disease Mother   . Thyroid disease Mother   . Parkinsonism Father   . Thyroid disease Sister   . Diabetes Maternal Uncle     SOCHx:   reports that he quit smoking about 18 years ago. His smoking use included Cigarettes. He has a 14.00 pack-year smoking history. He has never used smokeless tobacco. He reports that he does not drink alcohol or use drugs.  ALLERGIES:  Allergies  Allergen Reactions  . Simvastatin     Joint pain.    . Sulfamethoxazole     REACTION: hives    ROS: A comprehensive review of systems was negative.  HOME MEDS: Current Outpatient Prescriptions  Medication Sig Dispense Refill  . acetaminophen (TYLENOL ARTHRITIS PAIN) 650 MG CR tablet Take 650 mg by mouth every 8 (eight) hours as needed for pain.    Marland Kitchen acyclovir (ZOVIRAX) 200 MG capsule Take 1 capsule (200 mg total) by mouth 5 (five) times daily. Fever blisters 180 capsule 3  . azithromycin (ZITHROMAX) 250 MG tablet As directed 6 tablet 0  . clomiPHENE (CLOMID) 50 MG tablet Take 0.5 tablets (25 mg total) by mouth 2 (two) times a week. 15 tablet 1  . diphenhydrAMINE (BENADRYL) 25 MG tablet Take 25 mg by mouth at bedtime as needed for sleep. For sleep.    Marland Kitchen ezetimibe (ZETIA) 10 MG tablet Take 1 tablet (10 mg total) by mouth daily. 90 tablet 2  . HYDROcodone-acetaminophen (NORCO/VICODIN) 5-325 MG tablet Take 1-2 tablets by mouth every 4 (four) hours as needed for moderate pain. 84 tablet 0  . ketoconazole (NIZORAL) 2 % cream Apply 1 application topically 2 (two) times daily. 30 g 5  . levothyroxine (SYNTHROID, LEVOTHROID) 150 MCG tablet Take 1 tablet (150 mcg total) by mouth daily. 90 tablet 2  . losartan-hydrochlorothiazide (HYZAAR) 100-25 MG tablet Take 1 tablet by mouth daily. 90 tablet 2  . methocarbamol (ROBAXIN) 500 MG tablet Take 1 tablet (500 mg total) by mouth every 6 (six) hours as needed for muscle spasms. 80 tablet 0  . metoprolol succinate (TOPROL-XL) 50 MG 24 hr tablet Take 1 tablet (50 mg  total) by mouth daily. Take with or immediately following a meal. 90 tablet 2  . mometasone (ELOCON) 0.1 % cream Apply topically as needed. 45 g 11  . POTASSIUM GLUCONATE PO Take 1,190 mg by mouth 4 (four) times a week. Monday, Wednesday, Friday, Sunday    . Sennosides-Docusate Sodium (STOOL SOFTENER LAXATIVE PO) Take by mouth. 3 times weekly- Tuesday, Thursday, Saturday     No current facility-administered medications for this visit.     LABS/IMAGING: No results found for this or any previous visit (from the past 48 hour(s)). No results found.  WEIGHTS: Wt Readings from Last 3 Encounters:  03/17/17 248 lb (112.5  kg)  12/08/16 242 lb 4.8 oz (109.9 kg)  11/23/16 243 lb (110.2 kg)    VITALS: BP 126/80   Pulse 62   Ht 5\' 9"  (1.753 m)   Wt 248 lb (112.5 kg)   BMI 36.62 kg/m   EXAM: General appearance: alert and no distress Neck: no carotid bruit and no JVD Lungs: clear to auscultation bilaterally Heart: regular rate and rhythm Abdomen: soft, non-tender; bowel sounds normal; no masses,  no organomegaly Extremities: extremities normal, atraumatic, no cyanosis or edema Pulses: 2+ and symmetric Skin: Skin color, texture, turgor normal. No rashes or lesions Neurologic: Grossly normal Psych: Mildly anxious  EKG: Normal sinus at 62, nonspecific IVCD, nonspecific T wave changes   ASSESSMENT: 1. Fatigue and dyspnea on exertion with abnormal ischemic EKG changes - low risk Myoview 2. Labile hypertension - BP stable on current regimen 3. High risk features for sleep apnea 4. Erectile dysfunction  PLAN: 1.   Luke Skinner seems to have had improvement in blood pressure. We'll continue his current regimen. I'm not sure that his "swimmy headedness" is related to blood pressure. He mentioned that he felt bad one time when his blood pressure was 150/80. It is unlikely this would cause him to feel the way that he does. In general his blood pressure control is pretty good. He is reporting  some erectile dysfunction however I deferred to his primary care provider whether he should be on additional treatment for that as he had side effects from Revatio in the past. Although he has risk factors for obstructive sleep apnea, he reports pretty good sleep at night and generally good and energy level during the day.  Follow-up annually or sooner as necessary.  Pixie Casino, MD, [facility] Attending Cardiologist [place] 03/17/2017, 12:51 PM

## 2017-03-24 ENCOUNTER — Ambulatory Visit (INDEPENDENT_AMBULATORY_CARE_PROVIDER_SITE_OTHER): Payer: 59 | Admitting: Family Medicine

## 2017-03-24 ENCOUNTER — Encounter: Payer: Self-pay | Admitting: Family Medicine

## 2017-03-24 VITALS — Temp 98.0°F | Ht 69.0 in | Wt 248.0 lb

## 2017-03-24 DIAGNOSIS — L237 Allergic contact dermatitis due to plants, except food: Secondary | ICD-10-CM | POA: Diagnosis not present

## 2017-03-24 MED ORDER — METHYLPREDNISOLONE ACETATE 80 MG/ML IJ SUSP
120.0000 mg | Freq: Once | INTRAMUSCULAR | Status: AC
  Administered 2017-03-24: 120 mg via INTRAMUSCULAR

## 2017-03-24 MED ORDER — HYDROCODONE-ACETAMINOPHEN 10-325 MG PO TABS: 1.0000 | ORAL_TABLET | Freq: Four times a day (QID) | ORAL | 0 refills | Status: DC | PRN

## 2017-03-24 MED ORDER — METHYLPREDNISOLONE 4 MG PO TBPK: ORAL_TABLET | ORAL | 0 refills | Status: DC

## 2017-03-24 NOTE — Patient Instructions (Signed)
WE NOW OFFER   [facility] FAST TRACK!!!  SAME DAY Appointments for ACUTE CARE  Such as: Sprains, Injuries, cuts, abrasions, rashes, muscle pain, joint pain, back pain Colds, flu, sore throats, headache, allergies, cough, fever  Ear pain, sinus and eye infections Abdominal pain, nausea, vomiting, diarrhea, upset stomach Animal/insect bites  3 Easy Ways to Schedule: Walk-In Scheduling Call in scheduling Mychart Sign-up: [email or web address]         

## 2017-03-24 NOTE — Progress Notes (Signed)
   Subjective:    Patient ID: Luke Skinner, male    DOB: Apr 22, 1956, 61 y.o.   MRN: [id number]  HPI Here for 3 days of itchy rash over the trunk, arms and legs. He thinks he was in contact with poison ivy last weekend playing golf. Using Benadryl.    Review of Systems  Constitutional: Negative.   Respiratory: Negative.   Cardiovascular: Negative.   Skin: Positive for rash.       Objective:   Physical Exam  Constitutional: He appears well-developed and well-nourished.  Cardiovascular: Normal rate, regular rhythm, normal heart sounds and intact distal pulses.   Pulmonary/Chest: Effort normal and breath sounds normal.  Skin:  Widespread red maculopapular rash           Assessment & Plan:  Contact dermatitis, treat with steroid shot today and will start a Medrol dose pack tomorrow. Alysia Penna, MD

## 2017-03-24 NOTE — Addendum Note (Signed)
Addended by: Aggie Hacker A on: 03/24/2017 04:00 PM   Modules accepted: Orders

## 2017-03-26 ENCOUNTER — Telehealth: Payer: Self-pay

## 2017-03-26 NOTE — Telephone Encounter (Signed)
PA request for Hydrocodone pending. Key: [id number]

## 2017-03-29 NOTE — Telephone Encounter (Signed)
PA approved, form faxed back to pharmacy. 

## 2017-04-08 ENCOUNTER — Other Ambulatory Visit (INDEPENDENT_AMBULATORY_CARE_PROVIDER_SITE_OTHER): Payer: 59

## 2017-04-08 DIAGNOSIS — E063 Autoimmune thyroiditis: Secondary | ICD-10-CM

## 2017-04-08 DIAGNOSIS — E291 Testicular hypofunction: Secondary | ICD-10-CM

## 2017-04-08 DIAGNOSIS — R7301 Impaired fasting glucose: Secondary | ICD-10-CM | POA: Diagnosis not present

## 2017-04-08 LAB — CBC
HEMATOCRIT: 41.5 % (ref 39.0–52.0)
Hemoglobin: 13.6 g/dL (ref 13.0–17.0)
MCHC: 32.8 g/dL (ref 30.0–36.0)
MCV: 84.8 fl (ref 78.0–100.0)
PLATELETS: 232 10*3/uL (ref 150.0–400.0)
RBC: 4.9 Mil/uL (ref 4.22–5.81)
RDW: 15.4 % (ref 11.5–15.5)
WBC: 8.6 10*3/uL (ref 4.0–10.5)

## 2017-04-08 LAB — T4, FREE: FREE T4: 0.87 ng/dL (ref 0.60–1.60)

## 2017-04-08 LAB — TSH: TSH: 2.54 u[IU]/mL (ref 0.35–4.50)

## 2017-04-08 LAB — GLUCOSE, RANDOM: Glucose, Bld: 106 mg/dL — ABNORMAL HIGH (ref 70–99)

## 2017-04-08 LAB — HEMOGLOBIN A1C: Hgb A1c MFr Bld: 6.5 % (ref 4.6–6.5)

## 2017-04-08 LAB — TESTOSTERONE: Testosterone: 393.77 ng/dL (ref 300.00–890.00)

## 2017-04-09 ENCOUNTER — Other Ambulatory Visit: Payer: 59

## 2017-04-11 NOTE — Progress Notes (Signed)
Patient ID: Luke Skinner, male   DOB: March 21, 1956, 61 y.o.   MRN: [id number]            Reason for visit: Follow-up of testosterone and thyroid    History of Present Illness:   Hypothyroidism was first diagnosed  when he was in his 46s. At the time of diagnosis patient was having symptoms of fatigue, difficulty with weight loss but does not remember other symptoms  The patient has been treated with  levothyroxine 150 g for most of the duration of his hypothyroidism With starting thyroid supplementation the patient's symptoms had improved  He is taking his thyroid supplement  regularly in the morning and takes his multivitamin later in the day No unusual fatigue, weight has gone up some     Patient's weight history is as follows:   Wt Readings from Last 3 Encounters:  04/12/17 246 lb 6.4 oz (111.8 kg)  03/24/17 248 lb (112.5 kg)  03/17/17 248 lb (112.5 kg)    His TSH level has been consistently normal     Lab Results  Component Value Date   TSH 2.54 04/08/2017   TSH 2.28 11/18/2016   TSH 1.79 07/22/2016   FREET4 0.87 04/08/2017   FREET4 0.95 11/18/2016   FREET4 1.05 05/07/2016    Other problems:  History of HYPOGONADISM:  He was having significant amount of fatigue, lack of energy in 2011 and because of his low testosterone level he was given AndroGel. He does not know if he felt better with this as he did not take it for long.  He stopped it apparently because his PSA went up to 4.5  He was seen in consultation Here with symptoms of feeling more tired and lack of energy  He also has had decreased libido long-standing Also has had long-standing erectile dysfunction of unclear etiology  Although his total testosterone was normal his free testosterone was low at 5.6 Since his LH and prolactin level and baseline were in the normal range he was started on a trial of clomiphene half tablet 3 times a week of 50 mg  Recent history: With clomiphene 3 times a week his  testosterone level was nearly 800 and subsequently taking this twice a week He says he feels overall well with his energy level and motivation but has decreased libido  Currently on clomiphene half tablet twice a week  Overall testosterone has been quite stable although slightly lower than before   Lab Results  Component Value Date   TESTOSTERONE 393.77 04/08/2017   TESTOSTERONE 555.00 11/18/2016   TESTOSTERONE 775 11/18/2016   TESTOSTERONE 428 05/07/2016    Lab on 04/08/2017  Component Date Value Ref Range Status  . Testosterone 04/08/2017 393.77  300.00 - 890.00 ng/dL Final  . TSH 04/08/2017 2.54  0.35 - 4.50 uIU/mL Final  . Free T4 04/08/2017 0.87  0.60 - 1.60 ng/dL Final   Comment: Specimens from patients who are undergoing biotin therapy and /or ingesting biotin supplements may contain high levels of biotin.  The higher biotin concentration in these specimens interferes with this Free T4 assay.  Specimens that contain high levels  of biotin may cause false high results for this Free T4 assay.  Please interpret results in light of the total clinical presentation of the patient.    . WBC 04/08/2017 8.6  4.0 - 10.5 K/uL Final  . RBC 04/08/2017 4.90  4.22 - 5.81 Mil/uL Final  . Platelets 04/08/2017 232.0  150.0 - 400.0 K/uL Final  .  Hemoglobin 04/08/2017 13.6  13.0 - 17.0 g/dL Final  . HCT 04/08/2017 41.5  39.0 - 52.0 % Final  . MCV 04/08/2017 84.8  78.0 - 100.0 fl Final  . MCHC 04/08/2017 32.8  30.0 - 36.0 g/dL Final  . RDW 04/08/2017 15.4  11.5 - 15.5 % Final  . Hgb A1c MFr Bld 04/08/2017 6.5  4.6 - 6.5 % Final   Glycemic Control Guidelines for People with Diabetes:Non Diabetic:  <6%Goal of Therapy: <7%Additional Action Suggested:  >8%   . Glucose, Bld 04/08/2017 106* 70 - 99 mg/dL Final      Past Medical History:  Diagnosis Date  . Allergy   . Arthritis    oa  . Herpes stomatitis    fever blisters occasional  . History of kidney stones   . Hyperlipidemia   .  Hypertension   . Hypothyroidism   . Pneumonia 2006 last time   several times   . Rash    below both knees for many years uses selsum blue to wash with    Past Surgical History:  Procedure Laterality Date  . COLONOSCOPY  12-09-10   per Dr. Henrene Pastor, repeat in 10 yrs  . ESOPHAGUS SURGERY     as infant   . GASTROSTOMY  1989   per Dr. Annamaria Boots  . HERNIA REPAIR     left inguinal   . KNEE ARTHROSCOPY Left 1973, 1975, 1990 and 2003  . NASAL SEPTUM SURGERY    . SHOULDER ARTHROSCOPY  2005   on right per Dr. Wynelle Link  . TOTAL HIP ARTHROPLASTY Right 10/21/2016   Procedure: RIGHT TOTAL HIP ARTHROPLASTY ANTERIOR APPROACH;  Surgeon: Gaynelle Arabian, MD;  Location: [facility];  Service: Orthopedics;  Laterality: Right;  . TOTAL KNEE ARTHROPLASTY  2008   on left per Dr. Wynelle Link    Family History  Problem Relation Age of Onset  . Heart disease Mother   . Thyroid disease Mother   . Parkinsonism Father   . Thyroid disease Sister   . Diabetes Maternal Uncle     Social History:  reports that he quit smoking about 18 years ago. His smoking use included Cigarettes. He has a 14.00 pack-year smoking history. He has never used smokeless tobacco. He reports that he does not drink alcohol or use drugs.  Allergies:  Allergies  Allergen Reactions  . Simvastatin     Joint pain.    . Sulfamethoxazole     REACTION: hives    Allergies as of 04/12/2017      Reactions   Simvastatin    Joint pain.     Sulfamethoxazole    REACTION: hives      Medication List       Accurate as of 04/12/17  8:15 AM. Always use your most recent med list.          acyclovir 200 MG capsule Commonly known as:  ZOVIRAX Take 1 capsule (200 mg total) by mouth 5 (five) times daily. Fever blisters   clomiPHENE 50 MG tablet Commonly known as:  CLOMID Take 0.5 tablets (25 mg total) by mouth 2 (two) times a week.   diphenhydrAMINE 25 MG tablet Commonly known as:  BENADRYL Take 25 mg by mouth at bedtime as needed for sleep. For  sleep.   ezetimibe 10 MG tablet Commonly known as:  ZETIA Take 1 tablet (10 mg total) by mouth daily.   HYDROcodone-acetaminophen 10-325 MG tablet Commonly known as:  NORCO Take 1 tablet by mouth every 6 (six) hours  as needed for moderate pain.   ketoconazole 2 % cream Commonly known as:  NIZORAL Apply 1 application topically 2 (two) times daily.   levothyroxine 150 MCG tablet Commonly known as:  SYNTHROID, LEVOTHROID Take 1 tablet (150 mcg total) by mouth daily.   losartan-hydrochlorothiazide 100-25 MG tablet Commonly known as:  HYZAAR Take 1 tablet by mouth daily.   methocarbamol 500 MG tablet Commonly known as:  ROBAXIN Take 1 tablet (500 mg total) by mouth every 6 (six) hours as needed for muscle spasms.   metoprolol succinate 50 MG 24 hr tablet Commonly known as:  TOPROL-XL Take 1 tablet (50 mg total) by mouth daily. Take with or immediately following a meal.   MOBIC PO Take by mouth as needed.   mometasone 0.1 % cream Commonly known as:  ELOCON Apply topically as needed.   POTASSIUM GLUCONATE PO Take 1,190 mg by mouth 4 (four) times a week. Monday, Wednesday, Friday, Sunday   STOOL SOFTENER LAXATIVE PO Take by mouth. 3 times weekly- Tuesday, Thursday, Saturday   TYLENOL ARTHRITIS PAIN 650 MG CR tablet Generic drug:  acetaminophen Take 650 mg by mouth every 8 (eight) hours as needed for pain.        Review of Systems      His fasting glucose was 101, now 106 Previously A1c stable at 6.2 and now 6.5 He says he is starting to exercise but may not be watch his diet with eating out a lot  He is doing some working out at a gym and playing golf but has gained weight  Wt Readings from Last 3 Encounters:  04/12/17 246 lb 6.4 oz (111.8 kg)  03/24/17 248 lb (112.5 kg)  03/17/17 248 lb (112.5 kg)     Lab Results  Component Value Date   HGBA1C 6.5 04/08/2017   HGBA1C 6.2 05/07/2016   HGBA1C 6.2 01/10/2016   Lab Results  Component Value Date    LDLCALC 135 (H) 12/04/2015   CREATININE 0.84 10/23/2016     Examination:    BP (!) 146/86   Pulse 66   Ht 5\' 9"  (1.753 m)   Wt 246 lb 6.4 oz (111.8 kg)   SpO2 96%   BMI 36.39 kg/m      Assessment:  HYPOGONADOTROPIC hypogonadism   He has had good response to low-dose clomiphene 25 mg twice a week with improved energy level and overall less fatigue  Testosterone level is around 400 and 70 medically is doing fairly well  HYPOTHYROIDISM, long-standing and secondary to autoimmune disease with strong family History  His TSH is consistently normal with his 150 g of levothyroxine which she takes regularly  IMPAIRED fasting glucose: A1c is actively higher at 6.5 and discussed that he is borderline for diabetes Needs to significantly change lifestyle to help reduce progression  PLAN:   No change in clomiphene twice a week  Emphasized the need for weight loss for various reasons to improve his prediabetes, reduced chances of progression to diabetes, improved hypogonadotropic hypogonadism and probably lipids and blood pressure also  He will see the dietitian along with his wife  Follow-up in 6 months with repeat testosterone  Continue same dose of levothyroxine 150 g  Lipid panel to be checked by PCP on his physical    Lamar Naef 04/12/2017, 8:15 AM

## 2017-04-12 ENCOUNTER — Ambulatory Visit (INDEPENDENT_AMBULATORY_CARE_PROVIDER_SITE_OTHER): Payer: 59 | Admitting: Endocrinology

## 2017-04-12 ENCOUNTER — Encounter: Payer: Self-pay | Admitting: Endocrinology

## 2017-04-12 VITALS — BP 146/86 | HR 66 | Ht 69.0 in | Wt 246.4 lb

## 2017-04-12 DIAGNOSIS — R7303 Prediabetes: Secondary | ICD-10-CM

## 2017-04-12 DIAGNOSIS — E063 Autoimmune thyroiditis: Secondary | ICD-10-CM

## 2017-04-12 DIAGNOSIS — E230 Hypopituitarism: Secondary | ICD-10-CM

## 2017-04-12 MED ORDER — CLOMIPHENE CITRATE 50 MG PO TABS: 25.0000 mg | ORAL_TABLET | ORAL | 1 refills | Status: DC

## 2017-04-19 ENCOUNTER — Telehealth: Payer: Self-pay | Admitting: Family Medicine

## 2017-04-19 NOTE — Telephone Encounter (Signed)
Patient requests a call back about the prescriptions right after you call them in so they can call [facility].  Okay to leave a message.  Patient wants all his prescriptions on file refilled for 90 days.  Pharmacy- [facility] on [address]

## 2017-04-20 NOTE — Telephone Encounter (Signed)
Sent pt a my chart message, we need exact names of medications and then we can send in.

## 2017-04-26 ENCOUNTER — Other Ambulatory Visit: Payer: Self-pay | Admitting: Family Medicine

## 2017-04-26 MED ORDER — LEVOTHYROXINE SODIUM 150 MCG PO TABS: 150.0000 ug | ORAL_TABLET | Freq: Every day | ORAL | 1 refills | Status: DC

## 2017-04-26 MED ORDER — METOPROLOL SUCCINATE ER 50 MG PO TB24: 50.0000 mg | ORAL_TABLET | Freq: Every day | ORAL | 1 refills | Status: DC

## 2017-04-26 MED ORDER — MELOXICAM 15 MG PO TABS
15.0000 mg | ORAL_TABLET | Freq: Every day | ORAL | 1 refills | Status: DC | PRN
Start: 2017-04-26 — End: 2017-11-08

## 2017-04-26 MED ORDER — EZETIMIBE 10 MG PO TABS: 10.0000 mg | ORAL_TABLET | Freq: Every day | ORAL | 1 refills | Status: DC

## 2017-04-26 MED ORDER — LOSARTAN POTASSIUM-HCTZ 100-25 MG PO TABS: 1.0000 | ORAL_TABLET | Freq: Every day | ORAL | 1 refills | Status: DC

## 2017-04-26 NOTE — Telephone Encounter (Signed)
This is a duplicate, see previous note.  

## 2017-05-21 ENCOUNTER — Other Ambulatory Visit: Payer: 59

## 2017-05-24 ENCOUNTER — Ambulatory Visit: Payer: 59 | Admitting: Endocrinology

## 2017-05-31 ENCOUNTER — Encounter: Payer: Self-pay | Admitting: Family Medicine

## 2017-05-31 ENCOUNTER — Ambulatory Visit (INDEPENDENT_AMBULATORY_CARE_PROVIDER_SITE_OTHER): Payer: BLUE CROSS/BLUE SHIELD | Admitting: Family Medicine

## 2017-05-31 VITALS — BP 128/72 | Temp 98.0°F | Ht 69.0 in | Wt 247.0 lb

## 2017-05-31 DIAGNOSIS — J018 Other acute sinusitis: Secondary | ICD-10-CM

## 2017-05-31 MED ORDER — AZITHROMYCIN 250 MG PO TABS: ORAL_TABLET | ORAL | 0 refills | Status: DC

## 2017-05-31 NOTE — Progress Notes (Signed)
   Subjective:    Patient ID: Luke Skinner, male    DOB: 1956-07-05, 61 y.o.   MRN: [id number]  HPI Here for one week of sinus pressure, PND, and blowing yellow mucus from the nose.    Review of Systems  Constitutional: Negative.   HENT: Positive for congestion, postnasal drip, sinus pain and sinus pressure. Negative for sore throat.   Eyes: Negative.   Respiratory: Negative.        Objective:   Physical Exam  Constitutional: He appears well-developed and well-nourished.  HENT:  Right Ear: External ear normal.  Left Ear: External ear normal.  Nose: Nose normal.  Mouth/Throat: Oropharynx is clear and moist.  Eyes: Conjunctivae are normal.  Neck: No thyromegaly present.  Cardiovascular: Normal rate, regular rhythm, normal heart sounds and intact distal pulses.   Pulmonary/Chest: Effort normal and breath sounds normal. No respiratory distress. He has no wheezes. He has no rales.  Lymphadenopathy:    He has no cervical adenopathy.          Assessment & Plan:  Sinusitis, treat with a Zpack.  Alysia Penna, MD

## 2017-05-31 NOTE — Patient Instructions (Signed)
WE NOW OFFER   [facility] FAST TRACK!!!  SAME DAY Appointments for ACUTE CARE  Such as: Sprains, Injuries, cuts, abrasions, rashes, muscle pain, joint pain, back pain Colds, flu, sore throats, headache, allergies, cough, fever  Ear pain, sinus and eye infections Abdominal pain, nausea, vomiting, diarrhea, upset stomach Animal/insect bites  3 Easy Ways to Schedule: Walk-In Scheduling Call in scheduling Mychart Sign-up: [email or web address]         

## 2017-07-08 ENCOUNTER — Encounter: Payer: Self-pay | Admitting: Family Medicine

## 2017-07-15 ENCOUNTER — Ambulatory Visit (INDEPENDENT_AMBULATORY_CARE_PROVIDER_SITE_OTHER): Payer: BLUE CROSS/BLUE SHIELD | Admitting: *Deleted

## 2017-07-15 DIAGNOSIS — Z23 Encounter for immunization: Secondary | ICD-10-CM

## 2017-10-05 ENCOUNTER — Other Ambulatory Visit (INDEPENDENT_AMBULATORY_CARE_PROVIDER_SITE_OTHER): Payer: BLUE CROSS/BLUE SHIELD

## 2017-10-05 ENCOUNTER — Other Ambulatory Visit: Payer: 59

## 2017-10-05 DIAGNOSIS — R7303 Prediabetes: Secondary | ICD-10-CM

## 2017-10-05 DIAGNOSIS — E230 Hypopituitarism: Secondary | ICD-10-CM

## 2017-10-05 DIAGNOSIS — E063 Autoimmune thyroiditis: Secondary | ICD-10-CM

## 2017-10-05 LAB — TSH: TSH: 3.86 u[IU]/mL (ref 0.35–4.50)

## 2017-10-05 LAB — HEMOGLOBIN A1C: Hgb A1c MFr Bld: 6.5 % (ref 4.6–6.5)

## 2017-10-05 LAB — GLUCOSE, RANDOM: GLUCOSE: 101 mg/dL — AB (ref 70–99)

## 2017-10-07 LAB — TESTOSTERONE, FREE, TOTAL, SHBG
Sex Hormone Binding: 80.2 nmol/L — ABNORMAL HIGH (ref 19.3–76.4)
TESTOSTERONE FREE: 8.3 pg/mL (ref 6.6–18.1)
TESTOSTERONE: 712 ng/dL (ref 264–916)

## 2017-10-08 ENCOUNTER — Encounter: Payer: Self-pay | Admitting: Endocrinology

## 2017-10-08 ENCOUNTER — Ambulatory Visit (INDEPENDENT_AMBULATORY_CARE_PROVIDER_SITE_OTHER): Payer: BLUE CROSS/BLUE SHIELD | Admitting: Endocrinology

## 2017-10-08 VITALS — BP 132/88 | HR 70 | Ht 69.0 in | Wt 252.0 lb

## 2017-10-08 DIAGNOSIS — E230 Hypopituitarism: Secondary | ICD-10-CM | POA: Diagnosis not present

## 2017-10-08 DIAGNOSIS — E063 Autoimmune thyroiditis: Secondary | ICD-10-CM | POA: Diagnosis not present

## 2017-10-08 DIAGNOSIS — R7301 Impaired fasting glucose: Secondary | ICD-10-CM

## 2017-10-08 NOTE — Patient Instructions (Addendum)
Take extra 1/2 pill week of Thyroid  Restart diet and call if Dietician consult need  Restart exercise

## 2017-10-08 NOTE — Progress Notes (Signed)
Patient ID: Luke Skinner, male   DOB: [birth year], 61 y.o.   MRN: [id number]            Reason for visit: Follow-up of testosterone and thyroid    History of Present Illness:   Hypothyroidism was first diagnosed  when he was in his 83s. At the time of diagnosis patient was having symptoms of fatigue, difficulty with weight loss but does not remember other symptoms  The patient has been treated with  levothyroxine 150 g for most of the duration of his hypothyroidism With starting thyroid supplementation the patient's symptoms had improved  He does not think he has had any sluggishness on a consistent basis or anything new He takes the generic levothyroxine and is doing this fairly consistently before breakfast now As before his weight has gone up  Weight history:  Wt Readings from Last 3 Encounters:  10/08/17 252 lb (114.3 kg)  05/31/17 247 lb (112 kg)  04/12/17 246 lb 6.4 oz (111.8 kg)    His TSH level has been consistently normal but now it is upper normal at 3.9      Lab Results  Component Value Date   TSH 3.86 10/05/2017   TSH 2.54 04/08/2017   TSH 2.28 11/18/2016   FREET4 0.87 04/08/2017   FREET4 0.95 11/18/2016   FREET4 1.05 05/07/2016    Other problems:  History of HYPOGONADISM:  He was having significant amount of fatigue, lack of energy in 2011 and because of his low testosterone level he was given AndroGel. He does not know if he felt better with this as he did not take it for long.  He stopped it apparently because his PSA went up to 4.5  He was seen in consultation Here with symptoms of feeling more tired and lack of energy  He also has had decreased libido long-standing Also has had long-standing erectile dysfunction of unclear etiology  Although his total testosterone was normal his free testosterone was low at 5.6 Since his LH and prolactin level and baseline were in the normal range he was started on a trial of clomiphene half tablet 3 times a  week of 50 mg  Recent history: With clomiphene  twice a week now his testosterone level has been excellent He has continued to improve his energy level and motivation Although his total testosterone is 712 his free testosterone is in the lower half of the range because of high SHBG     Lab Results  Component Value Date   TESTOSTERONE 712 10/05/2017   TESTOSTERONE 393.77 04/08/2017   TESTOSTERONE 555.00 11/18/2016   TESTOSTERONE 775 11/18/2016    Appointment on 10/05/2017  Component Date Value Ref Range Status  . Hgb A1c MFr Bld 10/05/2017 6.5  4.6 - 6.5 % Final   Glycemic Control Guidelines for People with Diabetes:Non Diabetic:  <6%Goal of Therapy: <7%Additional Action Suggested:  >8%   . Glucose, Bld 10/05/2017 101* 70 - 99 mg/dL Final  . TSH 10/05/2017 3.86  0.35 - 4.50 uIU/mL Final  . Testosterone 10/05/2017 712  264 - 916 ng/dL Final   Comment: Adult male reference interval is based on a population of healthy nonobese males (BMI <30) between 49 and 86 years old. [place], [place] [id number]. PMID: [id number].   Marland Kitchen Testosterone, Free 10/05/2017 8.3  6.6 - 18.1 pg/mL Final  . Sex Hormone Binding 10/05/2017 80.2* 19.3 - 76.4 nmol/L Final      Past Medical History:  Diagnosis Date  . Allergy   .  Arthritis    oa  . Herpes stomatitis    fever blisters occasional  . History of kidney stones   . Hyperlipidemia   . Hypertension   . Hypothyroidism   . Pneumonia 2006 last time   several times   . Rash    below both knees for many years uses selsum blue to wash with    Past Surgical History:  Procedure Laterality Date  . COLONOSCOPY  12-09-10   per Dr. Henrene Pastor, repeat in 10 yrs  . ESOPHAGUS SURGERY     as infant   . GASTROSTOMY  1989   per Dr. Annamaria Boots  . HERNIA REPAIR     left inguinal   . KNEE ARTHROSCOPY Left 1973, 1975, 1990 and 2003  . NASAL SEPTUM SURGERY    . SHOULDER ARTHROSCOPY  2005   on right per Dr. Wynelle Link  . TOTAL HIP ARTHROPLASTY Right  10/21/2016   Procedure: RIGHT TOTAL HIP ARTHROPLASTY ANTERIOR APPROACH;  Surgeon: Gaynelle Arabian, MD;  Location: [facility];  Service: Orthopedics;  Laterality: Right;  . TOTAL KNEE ARTHROPLASTY  2008   on left per Dr. Wynelle Link    Family History  Problem Relation Age of Onset  . Heart disease Mother   . Thyroid disease Mother   . Parkinsonism Father   . Thyroid disease Sister   . Diabetes Maternal Uncle     Social History:  reports that he quit smoking about 18 years ago. His smoking use included cigarettes. He has a 14.00 pack-year smoking history. he has never used smokeless tobacco. He reports that he does not drink alcohol or use drugs.  Allergies:  Allergies  Allergen Reactions  . Simvastatin     Joint pain.    . Sulfamethoxazole     REACTION: hives    Allergies as of 10/08/2017      Reactions   Simvastatin    Joint pain.     Sulfamethoxazole    REACTION: hives      Medication List        Accurate as of 10/08/17  9:13 AM. Always use your most recent med list.          acyclovir 200 MG capsule Commonly known as:  ZOVIRAX Take 1 capsule (200 mg total) by mouth 5 (five) times daily. Fever blisters   azithromycin 250 MG tablet Commonly known as:  ZITHROMAX As directed   clomiPHENE 50 MG tablet Commonly known as:  CLOMID Take 0.5 tablets (25 mg total) by mouth 2 (two) times a week.   diphenhydrAMINE 25 MG tablet Commonly known as:  BENADRYL Take 25 mg by mouth at bedtime as needed for sleep. For sleep.   ezetimibe 10 MG tablet Commonly known as:  ZETIA Take 1 tablet (10 mg total) by mouth daily.   HYDROcodone-acetaminophen 10-325 MG tablet Commonly known as:  NORCO Take 1 tablet by mouth every 6 (six) hours as needed for moderate pain.   ketoconazole 2 % cream Commonly known as:  NIZORAL Apply 1 application topically 2 (two) times daily.   levothyroxine 150 MCG tablet Commonly known as:  SYNTHROID, LEVOTHROID Take 1 tablet (150 mcg total) by mouth  daily.   losartan-hydrochlorothiazide 100-25 MG tablet Commonly known as:  HYZAAR Take 1 tablet by mouth daily.   meloxicam 15 MG tablet Commonly known as:  MOBIC Take 1 tablet (15 mg total) by mouth daily as needed.   methocarbamol 500 MG tablet Commonly known as:  ROBAXIN Take 1 tablet (500 mg total)  by mouth every 6 (six) hours as needed for muscle spasms.   metoprolol succinate 50 MG 24 hr tablet Commonly known as:  TOPROL-XL Take 1 tablet (50 mg total) by mouth daily. Take with or immediately following a meal.   mometasone 0.1 % cream Commonly known as:  ELOCON Apply topically as needed.   POTASSIUM GLUCONATE PO Take 1,190 mg by mouth 4 (four) times a week. Monday, Wednesday, Friday, Sunday   STOOL SOFTENER LAXATIVE PO Take by mouth. 3 times weekly- Tuesday, Thursday, Saturday   TYLENOL ARTHRITIS PAIN 650 MG CR tablet Generic drug:  acetaminophen Take 650 mg by mouth every 8 (eight) hours as needed for pain.        Review of Systems      His fasting glucose was 101 Previously A1c stable at 6.2 and now 6.5 again   He is not committed to exercise and watching his diet and has gained 5 pounds He is not going to the gym anymore and only occasionally feels like playing golf   Wt Readings from Last 3 Encounters:  10/08/17 252 lb (114.3 kg)  05/31/17 247 lb (112 kg)  04/12/17 246 lb 6.4 oz (111.8 kg)     Lab Results  Component Value Date   HGBA1C 6.5 10/05/2017   HGBA1C 6.5 04/08/2017   HGBA1C 6.2 05/07/2016   Lab Results  Component Value Date   LDLCALC 135 (H) 12/04/2015   CREATININE 0.84 10/23/2016     Examination:    BP 132/88   Pulse 70   Ht 5\' 9"  (1.753 m)   Wt 252 lb (114.3 kg)   SpO2 95%   BMI 37.21 kg/m      Assessment:  HYPOGONADOTROPIC hypogonadism   He has had good response to low-dose clomiphene 25 mg twice a week with improved energy level and overall less fatigue  Free testosterone is normal although total testosterone is  relatively higher because of his high S HBG He will continue the same regimen again  HYPOTHYROIDISM, long-standing and secondary to autoimmune disease with strong family History  Although he is feeling fairly good his TSH is high normal, previously stable on the 150 g levothyroxine that he has been on for some time   IMPAIRED fasting glucose: A1c is again relatively higher at 6.5 He thinks he can try to do better with his diet and exercise regimen to year However he again refuses to see the dietitian   PLAN:   No change in clomiphene twice a week  Take extra half tablet weekly on his levothyroxine 150 g  He can have his TSH checked again with his PCP  Discussed need for prevention of diabetes since his A1c is 6.5 and he needs to start making lifestyle changes, he thinks he can join a gym next month and work with his wife on improving his diet also  Lipid panel to be checked by PCP on his physical  Follow-up in 4 months  Elayne Snare 10/08/2017, 9:13 AM

## 2017-10-14 ENCOUNTER — Other Ambulatory Visit: Payer: Self-pay | Admitting: Endocrinology

## 2017-10-14 ENCOUNTER — Telehealth: Payer: Self-pay | Admitting: Endocrinology

## 2017-10-14 ENCOUNTER — Other Ambulatory Visit: Payer: Self-pay

## 2017-10-14 MED ORDER — CLOMIPHENE CITRATE 50 MG PO TABS: 25.0000 mg | ORAL_TABLET | ORAL | 3 refills | Status: DC

## 2017-10-14 NOTE — Telephone Encounter (Signed)
This has been ordered and I called pharmacy top confirm and they were filling it for him

## 2017-10-14 NOTE — Telephone Encounter (Signed)
Pt states the pharmacy has not received the clomid rx

## 2017-10-14 NOTE — Telephone Encounter (Signed)
This has already been refilled

## 2017-11-08 ENCOUNTER — Ambulatory Visit (INDEPENDENT_AMBULATORY_CARE_PROVIDER_SITE_OTHER): Payer: BLUE CROSS/BLUE SHIELD | Admitting: Family Medicine

## 2017-11-08 ENCOUNTER — Encounter: Payer: Self-pay | Admitting: Family Medicine

## 2017-11-08 VITALS — BP 120/68 | HR 67 | Temp 98.2°F | Ht 69.0 in | Wt 246.2 lb

## 2017-11-08 DIAGNOSIS — Z0000 Encounter for general adult medical examination without abnormal findings: Secondary | ICD-10-CM | POA: Diagnosis not present

## 2017-11-08 DIAGNOSIS — Z125 Encounter for screening for malignant neoplasm of prostate: Secondary | ICD-10-CM | POA: Diagnosis not present

## 2017-11-08 LAB — CBC WITH DIFFERENTIAL/PLATELET
BASOS PCT: 0.4 % (ref 0.0–3.0)
Basophils Absolute: 0 10*3/uL (ref 0.0–0.1)
Eosinophils Absolute: 0.1 10*3/uL (ref 0.0–0.7)
Eosinophils Relative: 1.6 % (ref 0.0–5.0)
HCT: 42.2 % (ref 39.0–52.0)
Hemoglobin: 14 g/dL (ref 13.0–17.0)
LYMPHS ABS: 2.2 10*3/uL (ref 0.7–4.0)
Lymphocytes Relative: 28.6 % (ref 12.0–46.0)
MCHC: 33.1 g/dL (ref 30.0–36.0)
MCV: 85.9 fl (ref 78.0–100.0)
MONO ABS: 0.7 10*3/uL (ref 0.1–1.0)
Monocytes Relative: 9.7 % (ref 3.0–12.0)
NEUTROS PCT: 59.7 % (ref 43.0–77.0)
Neutro Abs: 4.6 10*3/uL (ref 1.4–7.7)
Platelets: 286 10*3/uL (ref 150.0–400.0)
RBC: 4.91 Mil/uL (ref 4.22–5.81)
RDW: 14 % (ref 11.5–15.5)
WBC: 7.7 10*3/uL (ref 4.0–10.5)

## 2017-11-08 LAB — HEPATIC FUNCTION PANEL
ALK PHOS: 55 U/L (ref 39–117)
ALT: 50 U/L (ref 0–53)
AST: 26 U/L (ref 0–37)
Albumin: 4.2 g/dL (ref 3.5–5.2)
BILIRUBIN DIRECT: 0.1 mg/dL (ref 0.0–0.3)
Total Bilirubin: 0.5 mg/dL (ref 0.2–1.2)
Total Protein: 6.6 g/dL (ref 6.0–8.3)

## 2017-11-08 LAB — LIPID PANEL
CHOLESTEROL: 170 mg/dL (ref 0–200)
HDL: 39.7 mg/dL (ref >39.00)
LDL Cholesterol: 104 mg/dL — ABNORMAL HIGH (ref 0–99)
NonHDL: 130.74
TRIGLYCERIDES: 135 mg/dL (ref 0.0–149.0)
Total CHOL/HDL Ratio: 4
VLDL: 27 mg/dL (ref 0.0–40.0)

## 2017-11-08 LAB — POC URINALSYSI DIPSTICK (AUTOMATED)
Bilirubin, UA: NEGATIVE
Blood, UA: NEGATIVE
GLUCOSE UA: NEGATIVE
KETONES UA: NEGATIVE
Leukocytes, UA: NEGATIVE
Nitrite, UA: NEGATIVE
Protein, UA: NEGATIVE
Spec Grav, UA: >=1.03 — AB (ref 1.010–1.025)
Urobilinogen, UA: 0.2 E.U./dL
pH, UA: 6 (ref 5.0–8.0)

## 2017-11-08 LAB — TSH: TSH: 2.34 u[IU]/mL (ref 0.35–4.50)

## 2017-11-08 LAB — BASIC METABOLIC PANEL
BUN: 20 mg/dL (ref 6–23)
CALCIUM: 9.7 mg/dL (ref 8.4–10.5)
CO2: 31 meq/L (ref 19–32)
Chloride: 103 mEq/L (ref 96–112)
Creatinine, Ser: 1.09 mg/dL (ref 0.40–1.50)
GFR: 72.89 mL/min (ref >60.00)
GLUCOSE: 102 mg/dL — AB (ref 70–99)
POTASSIUM: 4.6 meq/L (ref 3.5–5.1)
SODIUM: 141 meq/L (ref 135–145)

## 2017-11-08 LAB — PSA: PSA: 2 ng/mL (ref 0.10–4.00)

## 2017-11-08 MED ORDER — MELOXICAM 15 MG PO TABS: 15.0000 mg | ORAL_TABLET | Freq: Every day | ORAL | 3 refills | Status: DC | PRN

## 2017-11-08 MED ORDER — LOSARTAN POTASSIUM-HCTZ 100-25 MG PO TABS: 1.0000 | ORAL_TABLET | Freq: Every day | ORAL | 3 refills | Status: DC

## 2017-11-08 MED ORDER — ACYCLOVIR 200 MG PO CAPS: 200.0000 mg | ORAL_CAPSULE | Freq: Every day | ORAL | 3 refills | Status: DC

## 2017-11-08 MED ORDER — METOPROLOL SUCCINATE ER 50 MG PO TB24: 50.0000 mg | ORAL_TABLET | Freq: Every day | ORAL | 3 refills | Status: DC

## 2017-11-08 MED ORDER — EZETIMIBE 10 MG PO TABS: 10.0000 mg | ORAL_TABLET | Freq: Every day | ORAL | 3 refills | Status: DC

## 2017-11-08 MED ORDER — METHOCARBAMOL 500 MG PO TABS: 500.0000 mg | ORAL_TABLET | Freq: Four times a day (QID) | ORAL | 5 refills | Status: DC | PRN

## 2017-11-08 MED ORDER — LEVOTHYROXINE SODIUM 150 MCG PO TABS: ORAL_TABLET | ORAL | 1 refills | Status: DC

## 2017-11-08 NOTE — Progress Notes (Signed)
   Subjective:    Patient ID: Luke Skinner, male    DOB: 1956/01/18, 62 y.o.   MRN: [id number]  HPI Here for a weel exam. He feels well. Dr. Dwyane Dee changed his Synthroid dose recently and he saud for [place] to recheck it today.    Review of Systems  Constitutional: Negative.   HENT: Negative.   Eyes: Negative.   Respiratory: Negative.   Cardiovascular: Negative.   Gastrointestinal: Negative.   Genitourinary: Negative.   Musculoskeletal: Negative.   Skin: Negative.   Neurological: Negative.   Psychiatric/Behavioral: Negative.        Objective:   Physical Exam  Constitutional: He is oriented to person, place, and time. He appears well-developed and well-nourished. No distress.  HENT:  Head: Normocephalic and atraumatic.  Right Ear: External ear normal.  Left Ear: External ear normal.  Nose: Nose normal.  Mouth/Throat: Oropharynx is clear and moist. No oropharyngeal exudate.  Eyes: Conjunctivae and EOM are normal. Pupils are equal, round, and reactive to light. Right eye exhibits no discharge. Left eye exhibits no discharge. No scleral icterus.  Neck: Neck supple. No JVD present. No tracheal deviation present. No thyromegaly present.  Cardiovascular: Normal rate, regular rhythm, normal heart sounds and intact distal pulses. Exam reveals no gallop and no friction rub.  No murmur heard. Pulmonary/Chest: Effort normal and breath sounds normal. No respiratory distress. He has no wheezes. He has no rales. He exhibits no tenderness.  Abdominal: Soft. Bowel sounds are normal. He exhibits no distension and no mass. There is no tenderness. There is no rebound and no guarding.  Genitourinary: Rectum normal, prostate normal and penis normal. Rectal exam shows guaiac negative stool. No penile tenderness.  Musculoskeletal: Normal range of motion. He exhibits no edema or tenderness.  Lymphadenopathy:    He has no cervical adenopathy.  Neurological: He is alert and oriented to person, place,  and time. He has normal reflexes. No cranial nerve deficit. He exhibits normal muscle tone. Coordination normal.  Skin: Skin is warm and dry. No rash noted. He is not diaphoretic. No erythema. No pallor.  Psychiatric: He has a normal mood and affect. His behavior is normal. Judgment and thought content normal.          Assessment & Plan:  Well exam. We discussed diet and exercise. Get fasting labs. Alysia Penna, MD

## 2017-11-12 ENCOUNTER — Telehealth: Payer: Self-pay | Admitting: Family Medicine

## 2017-11-12 NOTE — Telephone Encounter (Signed)
Copied from [place] [id number]. Topic: Inquiry >> Nov 12, 2017  1:35 PM Neva Seat wrote: Pt is asking since his labs/numbers have came in, will he need to wait for his Rx to be prescribed.  And also wants to speak with someone to discuss.

## 2017-11-12 NOTE — Telephone Encounter (Signed)
Results have not been notated on in chart, and have not been given to pt. Not sure what medication rx he is referring to.

## 2017-11-15 MED ORDER — LEVOTHYROXINE SODIUM 150 MCG PO TABS: ORAL_TABLET | ORAL | 3 refills | Status: DC

## 2017-11-15 NOTE — Telephone Encounter (Signed)
Called and spoke with pt's wife Pam. She stated the medication her husband is talking about is for his thyroid medication.  Wanted to know results and if Dr. Sarajane Jews was able to take over the Rx for his. Dr. Dwyane Dee was filling this Rx for the pt.   Sent to PCP

## 2017-11-15 NOTE — Telephone Encounter (Signed)
Called and spoke with pt. Pt advised and voiced understanding. Rx has been sent into pharmacy. Dr.Fry gave me the OK to change disp amount due to pt taking a tablet and a half every Sunday,

## 2017-11-15 NOTE — Telephone Encounter (Signed)
Tell them his labs all looked good and his thyroid is at the proper level. Yes, I will take over writing for his Synthroid. Stay on the current dose, call in #90 with 3 rf

## 2017-11-17 ENCOUNTER — Telehealth: Payer: Self-pay | Admitting: Family Medicine

## 2017-11-17 MED ORDER — HYDROCHLOROTHIAZIDE 25 MG PO TABS: 25.0000 mg | ORAL_TABLET | Freq: Every day | ORAL | 3 refills | Status: DC

## 2017-11-17 MED ORDER — LOSARTAN POTASSIUM 100 MG PO TABS: 100.0000 mg | ORAL_TABLET | Freq: Every day | ORAL | 3 refills | Status: DC

## 2017-11-17 NOTE — Telephone Encounter (Signed)
Split it into Losartan 100 mg and HCTZ 25 mg each daily. Call in #90 of each with 3 rf

## 2017-11-17 NOTE — Telephone Encounter (Signed)
Copied from [place] [id number]. Topic: Quick Communication - See Telephone Encounter >> Nov 17, 2017  9:55 AM Antonieta Iba C wrote: CRM for notification. See Telephone encounter for: pt says that he spoke  with pharmacy and they never received Rx for  levothyroxine. Pt would like to know if Rx could be resent?   ALSO, pt says that his medication losartan-hydrochlorothiazide (HYZAAR) 100-25 MG tablet is on back order. He would like to be advised.   11/17/17.

## 2017-11-17 NOTE — Telephone Encounter (Signed)
Medications filled to pharmacy as requested. Pt notified of instructions and verbalized understanding.

## 2017-11-17 NOTE — Telephone Encounter (Signed)
Called the pharmacy and they did not get the thyroid Rx. Called in the prescription for the pt. Sent to PCP to advise on what to do with pt's BP medication due to being on back order.

## 2017-11-21 IMAGING — DX DG PORTABLE PELVIS
1 series · 1 of 1 positions shown · non-contrast
Comparison: Coronal and sagittal CT images from an abdominal and
pelvic CT scan March 05, 2014

CLINICAL DATA: Postoperative exam following right total hip joint
replacement.

EXAM:
PORTABLE PELVIS 1-2 VIEWS

[pelvis ap]
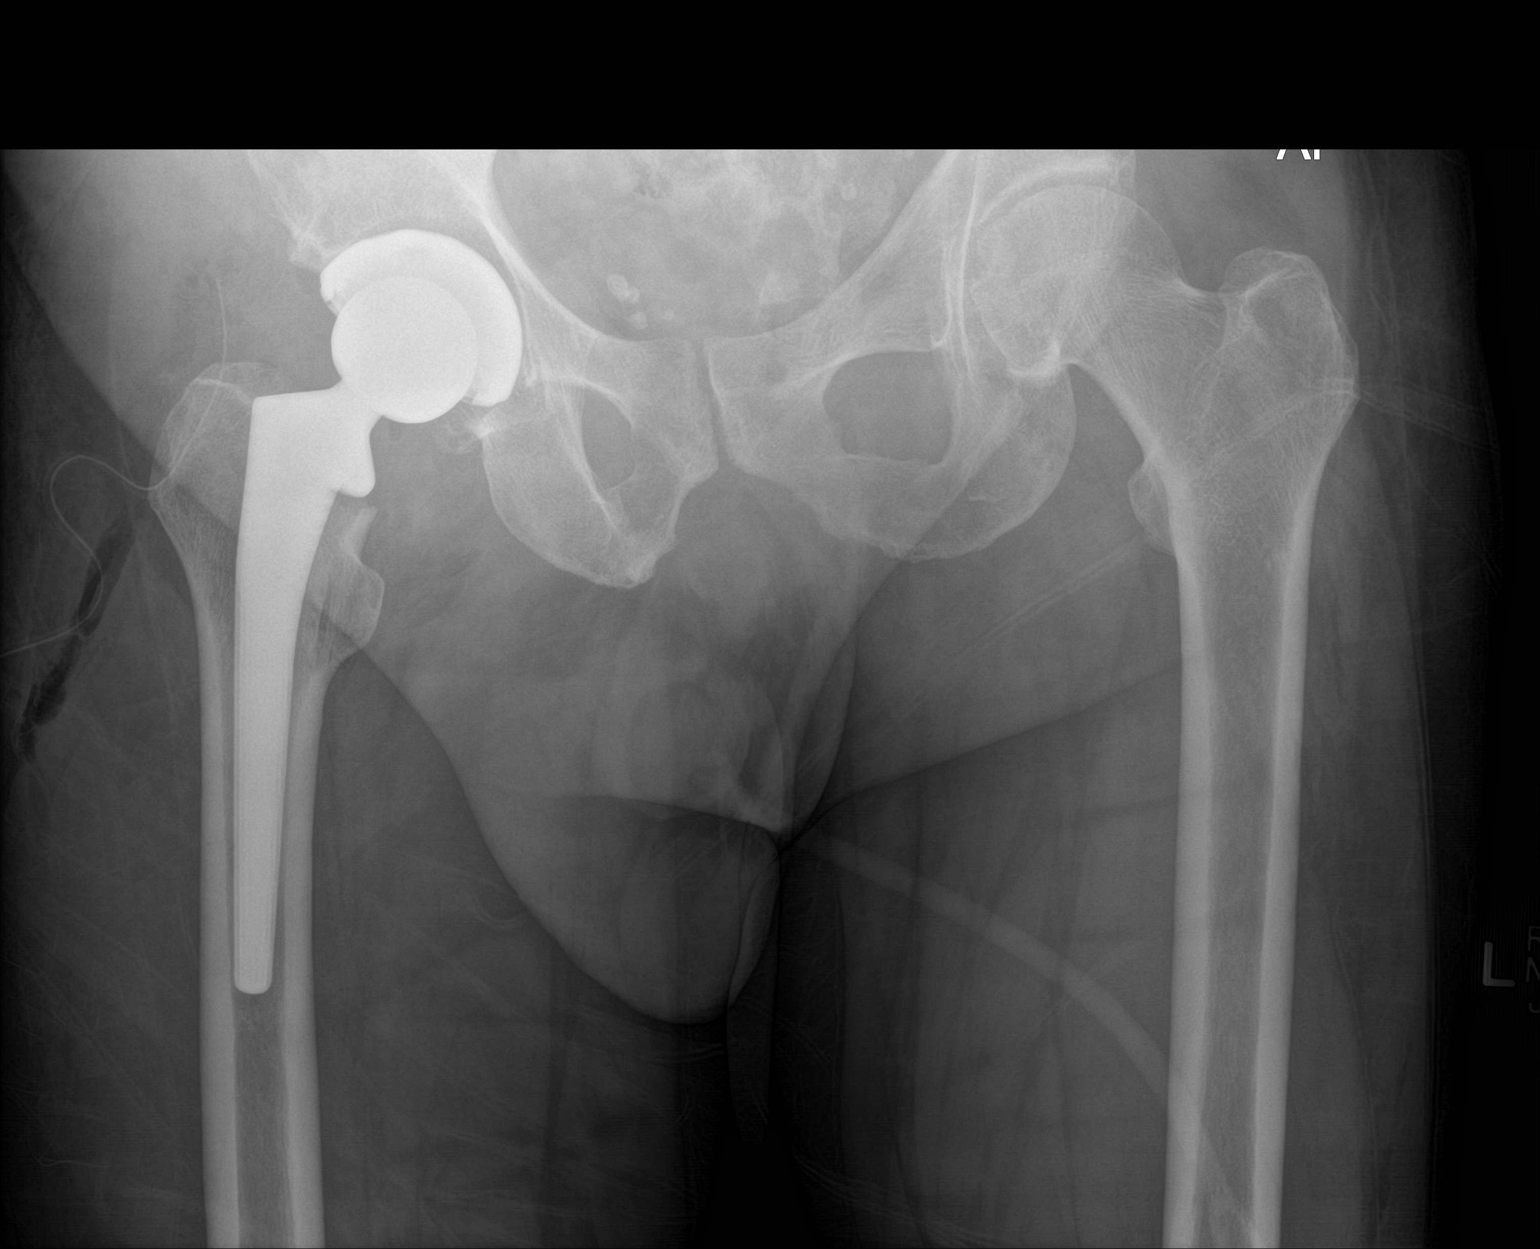

[1 of 1 positions shown; findings below may reference images not displayed]

FINDINGS: The patient has undergone right total hip joint prosthesis
placement. Radiographic positioning of the prosthetic components is
good. The interface with the native bone appears normal. A surgical
drain line is present.
IMPRESSION: No immediate complication following right total hip joint prosthesis
placement.

## 2018-01-12 ENCOUNTER — Telehealth: Payer: Self-pay | Admitting: Family Medicine

## 2018-01-12 NOTE — Telephone Encounter (Signed)
Pt called to check status of request, call pt if needed

## 2018-01-12 NOTE — Telephone Encounter (Signed)
Copied from [place] [id number]. Topic: General - Other >> Jan 12, 2018  8:17 AM Oneta Rack wrote: Relation to pt: self Call back number: [phone] (H) Pharmacy: [facility] [address] [place] [address] [phone] (Phone) [phone] (Fax)  Reason for call:  Patient states he's experiencing sore throat, head congestion and cough x2, patient requesting a ZPACK and "red cough medicine" . Patient states PCP has prescribed several times in the past when onset acute symptoms occur, please advise  >> Jan 12, 2018  8:22 AM Oneta Rack wrote: Relation to pt: self Call back number: [phone] (H) Pharmacy: [facility] [address] [place] [address] [phone] (Phone) [phone] (Fax)  Reason for call:  Patient states he's experiencing sore throat, head congestion and cough x2, patient requesting a ZPACK and "red cough medicine" . Patient states PCP has prescribed several times in the past when onset acute symptoms occur, please advise

## 2018-01-12 NOTE — Telephone Encounter (Signed)
Sent to PCP to advise 

## 2018-01-13 NOTE — Telephone Encounter (Signed)
Sent to PCP ?

## 2018-01-14 MED ORDER — AZITHROMYCIN 250 MG PO TABS: ORAL_TABLET | ORAL | 0 refills | Status: DC

## 2018-01-14 MED ORDER — HYDROCODONE-HOMATROPINE 5-1.5 MG/5ML PO SYRP: 5.0000 mL | ORAL_SOLUTION | ORAL | 0 refills | Status: DC | PRN

## 2018-01-14 NOTE — Telephone Encounter (Signed)
I sent these in to [facility]

## 2018-01-14 NOTE — Telephone Encounter (Signed)
Called pt and left a VM that we have sent in his medication requested into his [place].

## 2018-01-17 ENCOUNTER — Telehealth: Payer: Self-pay | Admitting: *Deleted

## 2018-01-17 MED ORDER — HYDROCODONE-HOMATROPINE 5-1.5 MG/5ML PO SYRP: 5.0000 mL | ORAL_SOLUTION | ORAL | 0 refills | Status: DC | PRN

## 2018-01-17 NOTE — Telephone Encounter (Signed)
Done

## 2018-01-17 NOTE — Telephone Encounter (Signed)
Pt would like for [place] to resent Rx to [facility] [address]. Called [facility] and cancel the Rx on file to allow Dr. Sarajane Jews to resend RX.

## 2018-01-17 NOTE — Telephone Encounter (Signed)
HYDROCOD/ HOM 5 -1.5 MG/ML  Note from pharmacy: Medication on back order, what wouldyou like to change to?

## 2018-01-17 NOTE — Telephone Encounter (Signed)
Sent to PCP to resend Rx due to Rx being on back order at [facility].

## 2018-01-17 NOTE — Telephone Encounter (Signed)
Either he can use Delsym OTC or we can send in Tussionex (which would be more expensive)

## 2018-01-17 NOTE — Addendum Note (Signed)
Addended by: Alysia Penna A on: 01/17/2018 05:48 PM   Modules accepted: Orders

## 2018-01-18 NOTE — Telephone Encounter (Signed)
Called pt and left a detailed VM that his medication had been resent

## 2018-03-01 ENCOUNTER — Telehealth: Payer: Self-pay | Admitting: Family Medicine

## 2018-03-01 NOTE — Telephone Encounter (Signed)
Copied from [place] [id number]. Topic: Quick Communication - See Telephone Encounter >> Mar 01, 2018  2:19 PM Clack, Laban Emperor wrote: CRM for notification. See Telephone encounter for: 03/01/18.  Pt calling to request a Rx called in for HYDROcodone 10-325 tablets.  [place], [place] - [address] [phone] (Phone) [phone] (Fax)

## 2018-03-01 NOTE — Telephone Encounter (Signed)
Please Advise  Last filled 03/24/17 #120 no refills.  Last OV 11/08/17

## 2018-03-02 NOTE — Telephone Encounter (Signed)
LVM for pt to call back as soon as possible.   RE: left detailed message that patient will need an appt for the rx rf of hydrocodone.

## 2018-03-02 NOTE — Telephone Encounter (Signed)
Patient was in the office in late January 2019 for his physical.  Dr. Sarajane Jews was going to write the prescription for Hydrocodone but the patient told him that he didn't need any at the time.  So Dr. Sarajane Jews told him to just call in when he needed them.  So he has, but now he is being told he has to come in for an appt.  Please advise.

## 2018-03-02 NOTE — Telephone Encounter (Signed)
He will need a PMV for this

## 2018-03-04 ENCOUNTER — Other Ambulatory Visit: Payer: Self-pay | Admitting: Family Medicine

## 2018-03-04 NOTE — Telephone Encounter (Signed)
Patient called and informed that Dr. Sarajane Jews wants him to scheduled an appointment before refilling Hydrocodone. He says "I called in and said that I saw him for a physical in January. At that time I did not need the prescription for the Hydrocodone, because I had plenty of pills. He told me, and my wife is a witness, that when I need the refill, he will write out a prescription. So, now I am needing it and I don't think I should have to come back in. He must have forgotten our conversation." I advised this request will be sent to him for review and approval, patient verbalized understanding.

## 2018-03-04 NOTE — Telephone Encounter (Signed)
Please advise 

## 2018-03-04 NOTE — Telephone Encounter (Signed)
See refill encounter for today-routed to Dr. Sarajane Jews to advise.

## 2018-03-04 NOTE — Telephone Encounter (Signed)
Copied from [place] [id number]. Topic: Quick Communication - See Telephone Encounter >> Mar 01, 2018  2:19 PM Clack, Laban Emperor wrote: CRM for notification. See Telephone encounter for: 03/01/18.  Pt calling to request a Rx called in for HYDROcodone 10-325 tablets.  [place], [place] - [address] [phone] (Phone) [phone] (Fax)      >> Mar 04, 2018 11:33 AM Neva Seat wrote: Pt has been waiting for 2 days for refill

## 2018-03-07 MED ORDER — HYDROCODONE-ACETAMINOPHEN 10-325 MG PO TABS
1.0000 | ORAL_TABLET | ORAL | 0 refills | Status: AC | PRN
Start: 2018-03-07 — End: 2018-03-12

## 2018-03-07 NOTE — Telephone Encounter (Signed)
Pt notified of results/instructions and verbalized understanding. 

## 2018-03-07 NOTE — Telephone Encounter (Signed)
I sent in a 5 day supply, that is all I am allowed to do at this point by law. If he wants a larger supply, he will need a PMV

## 2018-04-05 ENCOUNTER — Ambulatory Visit (INDEPENDENT_AMBULATORY_CARE_PROVIDER_SITE_OTHER): Payer: BLUE CROSS/BLUE SHIELD | Admitting: Internal Medicine

## 2018-04-05 ENCOUNTER — Encounter: Payer: Self-pay | Admitting: Internal Medicine

## 2018-04-05 VITALS — BP 130/64 | HR 62 | Ht 69.0 in | Wt 252.0 lb

## 2018-04-05 DIAGNOSIS — R5383 Other fatigue: Secondary | ICD-10-CM | POA: Diagnosis not present

## 2018-04-05 DIAGNOSIS — I10 Essential (primary) hypertension: Secondary | ICD-10-CM

## 2018-04-05 DIAGNOSIS — N529 Male erectile dysfunction, unspecified: Secondary | ICD-10-CM

## 2018-04-05 NOTE — Patient Instructions (Signed)
Your physician wants you to follow-up in: ONE YEAR with Dr. Hilty. You will receive a reminder letter in the mail two months in advance. If you don't receive a letter, please call our office to schedule the follow-up appointment.  

## 2018-04-05 NOTE — Progress Notes (Signed)
OFFICE NOTE  Chief Complaint:  Occasional "swimmy headedness"  Primary Care Physician: Laurey Morale, MD  HPI:  Luke Skinner is a pleasant 62 year old male who was kindly referred to me by Dr. Sarajane Jews for evaluation of a labile hypertension and dyspnea. He recently had changes in his blood pressure medicine including the addition of Toprol-XL 50 mg daily. He had reported some improvement in his blood pressures on this however was having problems with diastolic hypotension. His losartan was then decreased from 100 mg daily to 50 mg daily. Blood pressure then ran higher up in the 790W to 409 systolic. For a few days he took his wife's HCTZ and reported blood pressures in the 735H to 299 systolic. He has been having some dizziness and thinks this is related to blood pressure changes. He also reports some worsening fatigue and exercise intolerance, although denies any chest pain. He has difficult sleep at night and often times dozes off during the day. His Epworth sleepiness scale score was 10, suggesting the possibility of sleep apnea. He says that he had a sleep study in 2008 and this was negative for apnea, but did report that he does not get into REM sleep. An EKG in the office today is abnormal demonstrating anterolateral T-wave inversions in sinus bradycardia 55.  I saw [name] in the office today for follow-up of his stress test. This was negative for ischemia. Notably with the change in his medications including adding back his HCTZ, the blood pressure seems to be better controlled. He is currently on losartan 50 mg (one half tablet and (daily, HCTZ 25 mg daily and Toprol-XL 50 mg which she takes at night. He denies any worsening chest pain or shortness of breath.  [name] returns today for follow-up. We have further adjusted his blood pressure medicine and currently he is on the Hyzaar 10/25 and Toprol XL 50 mg daily. He reports his blood pressures at home range between 242 and  683 systolic over 41D. Overall he is feeling very well. Blood pressure today in the office was 130/72.  03/17/2017  [name] was seen in follow-up today. He reports some lability to his blood pressure but otherwise has been fairly well controlled. Today's 127/80. He is on metoprolol 50 mg daily at bedtime and he takes losartan HCTZ in the morning. He says that he notes sometimes in the morning he gets swimmy headed. Is not clearly associated with consistent medication use. He denies any chest pain or worsening shortness of breath. He does have some fatigue and is on treatment for low testosterone. He also inquired today about medications for erectile dysfunction. He says in the past he's used Viagra, though recently was given low-dose Revatio, but reports that after taking 4 tablets (80 mg) he felt his heart racing and was rather uncomfortable. I suspect this may be due to low blood pressure.  04/05/2018  [name] returns today for follow-up.  He occasionally is getting some swimmy headedness again.  There is some lability in his blood pressure and he notes is worse when he is not well-hydrated.  Blood pressure was 146/70 however repeat was 136/64.  Seems to be well controlled on his current medications.  Continues to struggle with erectile dysfunction but is followed by Dr. Louis Meckel for this.  PMHx:  Past Medical History:  Diagnosis Date  . Allergy   . Arthritis    oa  . Herpes stomatitis    fever blisters occasional  . History  of kidney stones   . Hyperlipidemia   . Hypertension   . Hypothyroidism   . Pneumonia 2006 last time   several times   . Rash    below both knees for many years uses selsum blue to wash with    Past Surgical History:  Procedure Laterality Date  . COLONOSCOPY  12-09-10   per Dr. Henrene Pastor, repeat in 10 yrs  . ESOPHAGUS SURGERY     as infant   . GASTROSTOMY  1989   per Dr. Annamaria Boots  . HERNIA REPAIR     left inguinal   . KNEE ARTHROSCOPY Left 1973, 1975, 1990  and 2003  . NASAL SEPTUM SURGERY    . SHOULDER ARTHROSCOPY  2005   on right per Dr. Wynelle Link  . TOTAL HIP ARTHROPLASTY Right 10/21/2016   Procedure: RIGHT TOTAL HIP ARTHROPLASTY ANTERIOR APPROACH;  Surgeon: Gaynelle Arabian, MD;  Location: [facility];  Service: Orthopedics;  Laterality: Right;  . TOTAL KNEE ARTHROPLASTY  2008   on left per Dr. Wynelle Link    FAMHx:  Family History  Problem Relation Age of Onset  . Heart disease Mother   . Thyroid disease Mother   . Parkinsonism Father   . Thyroid disease Sister   . Diabetes Maternal Uncle     SOCHx:   reports that he quit smoking about 19 years ago. His smoking use included cigarettes. He has a 14.00 pack-year smoking history. He has never used smokeless tobacco. He reports that he does not drink alcohol or use drugs.  ALLERGIES:  Allergies  Allergen Reactions  . Simvastatin     Joint pain.    . Sulfamethoxazole     REACTION: hives    ROS: Pertinent items noted in HPI and remainder of comprehensive ROS otherwise negative.  HOME MEDS: Current Outpatient Medications  Medication Sig Dispense Refill  . acetaminophen (TYLENOL ARTHRITIS PAIN) 650 MG CR tablet Take 650 mg by mouth every 8 (eight) hours as needed for pain.    Marland Kitchen acyclovir (ZOVIRAX) 200 MG capsule Take 1 capsule (200 mg total) by mouth 5 (five) times daily. Fever blisters 180 capsule 3  . clomiPHENE (CLOMID) 50 MG tablet TAKE 1/2 (ONE-HALF) TABLET BY MOUTH TWICE A WEEK 12 tablet 2  . diphenhydrAMINE (BENADRYL) 25 MG tablet Take 25 mg by mouth at bedtime as needed for sleep. For sleep.    Marland Kitchen ezetimibe (ZETIA) 10 MG tablet Take 1 tablet (10 mg total) by mouth daily. 90 tablet 3  . hydrochlorothiazide (HYDRODIURIL) 25 MG tablet Take 1 tablet (25 mg total) by mouth daily. 90 tablet 3  . ketoconazole (NIZORAL) 2 % cream Apply 1 application topically 2 (two) times daily. 30 g 5  . levothyroxine (SYNTHROID, LEVOTHROID) 150 MCG tablet Take 1.5 tablets on Sunday, then one tablet every  other day 96 tablet 3  . losartan (COZAAR) 100 MG tablet Take 1 tablet (100 mg total) by mouth daily. 90 tablet 3  . meloxicam (MOBIC) 15 MG tablet Take 1 tablet (15 mg total) by mouth daily as needed. 90 tablet 3  . methocarbamol (ROBAXIN) 500 MG tablet Take 1 tablet (500 mg total) by mouth every 6 (six) hours as needed for muscle spasms. 80 tablet 5  . metoprolol succinate (TOPROL-XL) 50 MG 24 hr tablet Take 1 tablet (50 mg total) by mouth daily. Take with or immediately following a meal. 90 tablet 3  . mometasone (ELOCON) 0.1 % cream Apply topically as needed. 45 g 11  . POTASSIUM GLUCONATE  PO Take 1,190 mg by mouth 4 (four) times a week. Monday, Wednesday, Friday, Sunday    . Sennosides-Docusate Sodium (STOOL SOFTENER LAXATIVE PO) Take by mouth. 3 times weekly- Tuesday, Thursday, Saturday     No current facility-administered medications for this visit.     LABS/IMAGING: No results found for this or any previous visit (from the past 48 hour(s)). No results found.  WEIGHTS: Wt Readings from Last 3 Encounters:  04/05/18 252 lb (114.3 kg)  11/08/17 246 lb 3.2 oz (111.7 kg)  10/08/17 252 lb (114.3 kg)    VITALS: BP (!) 146/70   Pulse 62   Ht 5\' 9"  (1.753 m)   Wt 252 lb (114.3 kg)   BMI 37.21 kg/m   EXAM: General appearance: alert and no distress Neck: no carotid bruit and no JVD Lungs: clear to auscultation bilaterally Heart: regular rate and rhythm Abdomen: soft, non-tender; bowel sounds normal; no masses,  no organomegaly Extremities: extremities normal, atraumatic, no cyanosis or edema Pulses: 2+ and symmetric Skin: Skin color, texture, turgor normal. No rashes or lesions Neurologic: Grossly normal Psych: Mildly anxious  EKG: Normal sinus rhythm at 62, nonspecific IVCD, nonspecific T wave changes-personally reviewed  ASSESSMENT: 1. Fatigue and dyspnea on exertion with abnormal ischemic EKG changes - low risk Myoview (07/2015) 2. Labile hypertension - BP stable on  current regimen 3. High risk features for sleep apnea 4. Erectile dysfunction  PLAN: 1.   Mr. Holston seems to be doing well with occasional episodes of lightheadedness.  Blood pressure is well controlled.  He struggles with erectile dysfunction but can follow-up with Dr. Louis Meckel for this with different options.  I have encouraged continued exercise and hydration during the summer.  If he struggles with more problems with dehydration then we could consider decreasing hydrochlorothiazide and increasing 1 of his other blood pressure medications to maintain his current good blood pressure control.  Follow-up annually or sooner as necessary.  Pixie Casino, MD, [facility], [place] Director of the [facility] Diplomate of the American Board of Clinical Lipidology Attending Cardiologist  Direct Dial: [phone]  Fax: [phone]  Website:  [email or web address] 04/05/2018, 8:26 AM

## 2018-04-11 ENCOUNTER — Other Ambulatory Visit (INDEPENDENT_AMBULATORY_CARE_PROVIDER_SITE_OTHER): Payer: BLUE CROSS/BLUE SHIELD

## 2018-04-11 DIAGNOSIS — R7301 Impaired fasting glucose: Secondary | ICD-10-CM

## 2018-04-11 DIAGNOSIS — E063 Autoimmune thyroiditis: Secondary | ICD-10-CM

## 2018-04-11 DIAGNOSIS — E230 Hypopituitarism: Secondary | ICD-10-CM

## 2018-04-11 LAB — GLUCOSE, RANDOM: Glucose, Bld: 171 mg/dL — ABNORMAL HIGH (ref 70–99)

## 2018-04-11 LAB — TSH: TSH: 0.1 u[IU]/mL — ABNORMAL LOW (ref 0.35–4.50)

## 2018-04-11 LAB — TESTOSTERONE: Testosterone: 453.41 ng/dL (ref 300.00–890.00)

## 2018-04-11 LAB — T4, FREE: FREE T4: 1.5 ng/dL (ref 0.60–1.60)

## 2018-04-11 LAB — HEMOGLOBIN A1C: Hgb A1c MFr Bld: 6.5 % (ref 4.6–6.5)

## 2018-04-14 NOTE — Progress Notes (Addendum)
Patient ID: Luke Skinner, male   DOB: May 31, 1956, 62 y.o.   MRN: [id number]            Reason for visit: Follow-up of testosterone and thyroid    History of Present Illness:   Hypothyroidism was first diagnosed  when he was in his 23s. At the time of diagnosis patient was having symptoms of fatigue, difficulty with weight loss but does not remember other symptoms  The patient has been treated with  levothyroxine 150 g for most of the duration of his hypothyroidism With starting thyroid supplementation the patient's symptoms had improved  He had a high normal TSH in 12/18 and was told to take an extra half tablet on Sundays of his 150 mcg dose Follow-up TSH was normal He does not complain of any unusual fatigue currently  Weight history:  Wt Readings from Last 3 Encounters:  04/15/18 248 lb 12.8 oz (112.9 kg)  04/05/18 252 lb (114.3 kg)  11/08/17 246 lb 3.2 oz (111.7 kg)    His TSH level is low at 0.1:  Lab Results  Component Value Date   TSH 0.10 (L) 04/11/2018   TSH 2.34 11/08/2017   TSH 3.86 10/05/2017   FREET4 1.50 04/11/2018   FREET4 0.87 04/08/2017   FREET4 0.95 11/18/2016    Other problems:  History of HYPOGONADISM:  He was having significant amount of fatigue, lack of energy in 2011 and because of his low testosterone level he was given AndroGel. He does not know if he felt better with this as he did not take it for long.  He stopped it apparently because his PSA went up to 4.5  He was seen in consultation with symptoms of feeling tired and lack of energy  He also has had decreased libido long-standing Also has had long-standing erectile dysfunction of unclear etiology  Although his total testosterone was normal his free testosterone was low at 5.6 Since his LH and prolactin level and baseline were in the normal range he was started on a trial of clomiphene half tablet 3 times a week of 50 mg  Recent history: With clomiphene  twice a week now his  testosterone level has been consistently normal He does not complain of any decreased motivation or unusual fatigue Testosterone level is still quite good at 453 He does have a high SHBG   Lab Results  Component Value Date   TESTOSTERONE 453.41 04/11/2018   TESTOSTERONE 712 10/05/2017   TESTOSTERONE 393.77 04/08/2017   TESTOSTERONE 555.00 11/18/2016   TESTOSTERONE 775 11/18/2016    Lab on 04/11/2018  Component Date Value Ref Range Status  . Testosterone 04/11/2018 453.41  300.00 - 890.00 ng/dL Final  . Free T4 04/11/2018 1.50  0.60 - 1.60 ng/dL Final   Comment: Specimens from patients who are undergoing biotin therapy and /or ingesting biotin supplements may contain high levels of biotin.  The higher biotin concentration in these specimens interferes with this Free T4 assay.  Specimens that contain high levels  of biotin may cause false high results for this Free T4 assay.  Please interpret results in light of the total clinical presentation of the patient.    Marland Kitchen TSH 04/11/2018 0.10* 0.35 - 4.50 uIU/mL Final  . Glucose, Bld 04/11/2018 171* 70 - 99 mg/dL Final  . Hgb A1c MFr Bld 04/11/2018 6.5  4.6 - 6.5 % Final   Glycemic Control Guidelines for People with Diabetes:Non Diabetic:  <6%Goal of Therapy: <7%Additional Action Suggested:  >8%  Past Medical History:  Diagnosis Date  . Allergy   . Arthritis    oa  . Herpes stomatitis    fever blisters occasional  . History of kidney stones   . Hyperlipidemia   . Hypertension   . Hypothyroidism   . Pneumonia 2006 last time   several times   . Rash    below both knees for many years uses selsum blue to wash with    Past Surgical History:  Procedure Laterality Date  . COLONOSCOPY  12-09-10   per Dr. Henrene Pastor, repeat in 10 yrs  . ESOPHAGUS SURGERY     as infant   . GASTROSTOMY  1989   per Dr. Annamaria Boots  . HERNIA REPAIR     left inguinal   . KNEE ARTHROSCOPY Left 1973, 1975, 1990 and 2003  . NASAL SEPTUM SURGERY    . SHOULDER  ARTHROSCOPY  2005   on right per Dr. Wynelle Link  . TOTAL HIP ARTHROPLASTY Right 10/21/2016   Procedure: RIGHT TOTAL HIP ARTHROPLASTY ANTERIOR APPROACH;  Surgeon: Gaynelle Arabian, MD;  Location: [facility];  Service: Orthopedics;  Laterality: Right;  . TOTAL KNEE ARTHROPLASTY  2008   on left per Dr. Wynelle Link    Family History  Problem Relation Age of Onset  . Heart disease Mother   . Thyroid disease Mother   . Parkinsonism Father   . Thyroid disease Sister   . Diabetes Maternal Uncle     Social History:  reports that he quit smoking about 19 years ago. His smoking use included cigarettes. He has a 14.00 pack-year smoking history. He has never used smokeless tobacco. He reports that he does not drink alcohol or use drugs.  Allergies:  Allergies  Allergen Reactions  . Simvastatin     Joint pain.    . Sulfamethoxazole     REACTION: hives    Allergies as of 04/15/2018      Reactions   Simvastatin    Joint pain.     Sulfamethoxazole    REACTION: hives      Medication List        Accurate as of 04/15/18 11:15 AM. Always use your most recent med list.          acyclovir 200 MG capsule Commonly known as:  ZOVIRAX Take 1 capsule (200 mg total) by mouth 5 (five) times daily. Fever blisters   clomiPHENE 50 MG tablet Commonly known as:  CLOMID TAKE 1/2 (ONE-HALF) TABLET BY MOUTH TWICE A WEEK   diphenhydrAMINE 25 MG tablet Commonly known as:  BENADRYL Take 25 mg by mouth at bedtime as needed for sleep. For sleep.   ezetimibe 10 MG tablet Commonly known as:  ZETIA Take 1 tablet (10 mg total) by mouth daily.   hydrochlorothiazide 25 MG tablet Commonly known as:  HYDRODIURIL Take 1 tablet (25 mg total) by mouth daily.   ketoconazole 2 % cream Commonly known as:  NIZORAL Apply 1 application topically 2 (two) times daily.   levothyroxine 150 MCG tablet Commonly known as:  SYNTHROID, LEVOTHROID Take 1.5 tablets on Sunday, then one tablet every other day   losartan 100 MG  tablet Commonly known as:  COZAAR Take 1 tablet (100 mg total) by mouth daily.   meloxicam 15 MG tablet Commonly known as:  MOBIC Take 1 tablet (15 mg total) by mouth daily as needed.   methocarbamol 500 MG tablet Commonly known as:  ROBAXIN Take 1 tablet (500 mg total) by mouth every 6 (six) hours  as needed for muscle spasms.   metoprolol succinate 50 MG 24 hr tablet Commonly known as:  TOPROL-XL Take 1 tablet (50 mg total) by mouth daily. Take with or immediately following a meal.   mometasone 0.1 % cream Commonly known as:  ELOCON Apply topically as needed.   POTASSIUM CHLORIDE PO Take 99 mg by mouth.   STOOL SOFTENER LAXATIVE PO Take by mouth. 3 times weekly- Tuesday, Thursday, Saturday   TYLENOL ARTHRITIS PAIN 650 MG CR tablet Generic drug:  acetaminophen Take 650 mg by mouth every 8 (eight) hours as needed for pain.        Review of Systems      His non-fasting glucose was 171 after breakfast which was relatively high fat  A1c 6.5 again   He is not starting any walking or exercise program as discussed previously However may have lost a little weight compared to his last visit His wife says that he does not watch his diet and despite knowing how to modify his diet he does not follow this Again refuses to see the dietitian   Wt Readings from Last 3 Encounters:  04/15/18 248 lb 12.8 oz (112.9 kg)  04/05/18 252 lb (114.3 kg)  11/08/17 246 lb 3.2 oz (111.7 kg)     Lab Results  Component Value Date   HGBA1C 6.5 04/11/2018   HGBA1C 6.5 10/05/2017   HGBA1C 6.5 04/08/2017   Lab Results  Component Value Date   LDLCALC 104 (H) 11/08/2017   CREATININE 1.09 11/08/2017     Examination:    BP 138/82 (BP Location: Left Arm, Patient Position: Sitting, Cuff Size: Normal)   Pulse 98   Ht 5\' 9"  (1.753 m)   Wt 248 lb 12.8 oz (112.9 kg)   SpO2 98%   BMI 36.74 kg/m    No edema present  Assessment:  HYPOGONADOTROPIC hypogonadism   He has had good  response to low-dose clomiphene 25 mg twice a week with improved energy level  Now testosterone is quite normal with this regimen and consistent  HYPOTHYROIDISM, long-standing and secondary to autoimmune disease with strong family History  Although he is feeling fairly good his TSH is high normal, previously stable on the 150 g levothyroxine that he has been on for some time   IMPAIRED fasting glucose: A1c is again relatively high at 6.5 However his nonfasting glucose is 171 and may have more glucose intolerance, previously fasting glucose has been just above 100 Discussed that he has a strong likelihood of progressing to diabetes unless he changes his lifestyle and lose his weight Explained the importance of exercise and cutting back on high carbohydrate high fat foods  He thinks he can try to do better with his diet and his wife was present today about trying to independence He also needs to start walking regularly and he thinks he can start doing this now However he again refuses to see the dietitian which would be ideal for meal planning   PLAN:   No change in clomiphene twice a week  Take only a half tablet weekly on his levothyroxine 150 g Follow-up in 2 months with glucose tolerance test Consider metformin if he has significantly abnormal labs  Total visit time for evaluation and management of multiple problems and counseling =25 minutes   Elayne Snare 04/15/2018, 11:15 AM

## 2018-04-15 ENCOUNTER — Ambulatory Visit (INDEPENDENT_AMBULATORY_CARE_PROVIDER_SITE_OTHER): Payer: BLUE CROSS/BLUE SHIELD | Admitting: Endocrinology

## 2018-04-15 ENCOUNTER — Encounter: Payer: Self-pay | Admitting: Endocrinology

## 2018-04-15 VITALS — BP 138/82 | HR 98 | Ht 69.0 in | Wt 248.8 lb

## 2018-04-15 DIAGNOSIS — R7303 Prediabetes: Secondary | ICD-10-CM

## 2018-04-15 DIAGNOSIS — E230 Hypopituitarism: Secondary | ICD-10-CM | POA: Diagnosis not present

## 2018-04-15 DIAGNOSIS — E063 Autoimmune thyroiditis: Secondary | ICD-10-CM | POA: Diagnosis not present

## 2018-04-15 MED ORDER — CLOMIPHENE CITRATE 50 MG PO TABS: ORAL_TABLET | ORAL | 1 refills | Status: DC

## 2018-04-15 NOTE — Patient Instructions (Addendum)
Do only take 1/2 thyroid pill on Sunday  Exercise daily  Skip fats and Carbs!  Go to main lab next

## 2018-06-10 ENCOUNTER — Other Ambulatory Visit (INDEPENDENT_AMBULATORY_CARE_PROVIDER_SITE_OTHER): Payer: BLUE CROSS/BLUE SHIELD

## 2018-06-10 DIAGNOSIS — R7303 Prediabetes: Secondary | ICD-10-CM | POA: Diagnosis not present

## 2018-06-10 DIAGNOSIS — E063 Autoimmune thyroiditis: Secondary | ICD-10-CM | POA: Diagnosis not present

## 2018-06-10 LAB — GLUCOSE TOLERANCE, 2 HOURS
GLUCOSE, 2 HOUR: 213 mg/dL
Glucose, 1 Hour GTT: 223 mg/dL
Glucose, Fasting: 118 mg/dL — ABNORMAL HIGH (ref 70–99)

## 2018-06-10 LAB — T4, FREE: Free T4: 1.01 ng/dL (ref 0.60–1.60)

## 2018-06-10 LAB — TSH: TSH: 0.02 u[IU]/mL — AB (ref 0.35–4.50)

## 2018-06-14 ENCOUNTER — Encounter: Payer: Self-pay | Admitting: Endocrinology

## 2018-06-14 ENCOUNTER — Ambulatory Visit (INDEPENDENT_AMBULATORY_CARE_PROVIDER_SITE_OTHER): Payer: BLUE CROSS/BLUE SHIELD | Admitting: Endocrinology

## 2018-06-14 VITALS — BP 164/76 | HR 62 | Ht 69.0 in | Wt 246.0 lb

## 2018-06-14 DIAGNOSIS — E1169 Type 2 diabetes mellitus with other specified complication: Secondary | ICD-10-CM | POA: Diagnosis not present

## 2018-06-14 DIAGNOSIS — E063 Autoimmune thyroiditis: Secondary | ICD-10-CM

## 2018-06-14 DIAGNOSIS — E669 Obesity, unspecified: Secondary | ICD-10-CM | POA: Diagnosis not present

## 2018-06-14 DIAGNOSIS — E230 Hypopituitarism: Secondary | ICD-10-CM | POA: Diagnosis not present

## 2018-06-14 MED ORDER — LEVOTHYROXINE SODIUM 125 MCG PO TABS: 125.0000 ug | ORAL_TABLET | Freq: Every day | ORAL | 3 refills | Status: DC

## 2018-06-14 MED ORDER — CLOMIPHENE CITRATE 50 MG PO TABS: ORAL_TABLET | ORAL | 1 refills | Status: DC

## 2018-06-14 MED ORDER — METFORMIN HCL ER 500 MG PO TB24: 1500.0000 mg | ORAL_TABLET | Freq: Every day | ORAL | 1 refills | Status: DC

## 2018-06-14 NOTE — Patient Instructions (Signed)
Start taking Metformin 500 mg, 1 tablet with your main meal for 5 days. Occasionally this may initially cause loose stools or nausea. If  tolerating well after 5 days add a second Metformin tablet (500 mg) at the same time. Continue adding another tablet after 5 days days if no persistent nausea or diarrhea until reaching the maximum tolerated dose or the full dose of 3 tablets once daily. 

## 2018-06-14 NOTE — Progress Notes (Signed)
Patient ID: Luke Skinner, male   DOB: 31-Mar-1956, 62 y.o.   MRN: [id number]            Reason for visit: Endocrinology follow-up    History of Present Illness:   Hypothyroidism was first diagnosed  when he was in his 24s. At the time of diagnosis patient was having symptoms of fatigue, difficulty with weight loss but does not remember other symptoms  The patient has been treated with  levothyroxine 150 g for most of the duration of his hypothyroidism With starting thyroid supplementation the patient's symptoms had improved  He has been mostly taking brand-name Synthroid for more consistent results However his thyroid levels have been fluctuating over the last several months In 12/18 he was hypothyroid with 150 mcg but this year he has had low TSH levels  Even with reducing the dose to 6-1/2 tablets a week with a reduction of 150 mcg weekly he is still having a low TSH He is not having any fatigue, unusual weight loss, shakiness or palpitations He is usually taking his supplement before breakfast  Weight history:  Wt Readings from Last 3 Encounters:  06/14/18 246 lb (111.6 kg)  04/15/18 248 lb 12.8 oz (112.9 kg)  04/05/18 252 lb (114.3 kg)    His TSH level is low at 0.1:  Lab Results  Component Value Date   TSH 0.02 (L) 06/10/2018   TSH 0.10 (L) 04/11/2018   TSH 2.34 11/08/2017   FREET4 1.01 06/10/2018   FREET4 1.50 04/11/2018   FREET4 0.87 04/08/2017    Other problems:   DIABETES: See review of systems   Hypogonadotrophic HYPOGONADISM:  He was having significant amount of fatigue, lack of energy in 2011 and because of his low testosterone level he was given AndroGel. He does not know if he felt better with this as he did not take it for long.  He stopped it apparently because his PSA went up to 4.5  He was seen in consultation with symptoms of feeling tired and lack of energy  He also has had decreased libido long-standing Also has had long-standing erectile  dysfunction of unclear etiology  Although his total testosterone was normal his free testosterone was low at 5.6 Since his LH and prolactin level and baseline were in the normal range he was started on a trial of clomiphene half tablet 3 times a week of 50 mg  Recent history: With clomiphene  twice a week his testosterone level has been consistently normal He does feel fairly good He does have a high SHBG   Lab Results  Component Value Date   TESTOSTERONE 453.41 04/11/2018   TESTOSTERONE 712 10/05/2017   TESTOSTERONE 393.77 04/08/2017   TESTOSTERONE 555.00 11/18/2016   TESTOSTERONE 775 11/18/2016    Appointment on 06/10/2018  Component Date Value Ref Range Status  . Glucose, Fasting 06/10/2018 118* 70 - 99 mg/dL Final  . Glucose, 1 Hour GTT 06/10/2018 223  mg/dL Final  . Glucose, 2 hour 06/10/2018 213  mg/dL Final  . Free T4 06/10/2018 1.01  0.60 - 1.60 ng/dL Final   Comment: Specimens from patients who are undergoing biotin therapy and /or ingesting biotin supplements may contain high levels of biotin.  The higher biotin concentration in these specimens interferes with this Free T4 assay.  Specimens that contain high levels  of biotin may cause false high results for this Free T4 assay.  Please interpret results in light of the total clinical presentation of the patient.    Marland Kitchen  TSH 06/10/2018 0.02* 0.35 - 4.50 uIU/mL Final      Past Medical History:  Diagnosis Date  . Allergy   . Arthritis    oa  . Herpes stomatitis    fever blisters occasional  . History of kidney stones   . Hyperlipidemia   . Hypertension   . Hypothyroidism   . Pneumonia 2006 last time   several times   . Rash    below both knees for many years uses selsum blue to wash with    Past Surgical History:  Procedure Laterality Date  . COLONOSCOPY  12-09-10   per Dr. Henrene Pastor, repeat in 10 yrs  . ESOPHAGUS SURGERY     as infant   . GASTROSTOMY  1989   per Dr. Annamaria Boots  . HERNIA REPAIR     left inguinal    . KNEE ARTHROSCOPY Left 1973, 1975, 1990 and 2003  . NASAL SEPTUM SURGERY    . SHOULDER ARTHROSCOPY  2005   on right per Dr. Wynelle Link  . TOTAL HIP ARTHROPLASTY Right 10/21/2016   Procedure: RIGHT TOTAL HIP ARTHROPLASTY ANTERIOR APPROACH;  Surgeon: Gaynelle Arabian, MD;  Location: [facility];  Service: Orthopedics;  Laterality: Right;  . TOTAL KNEE ARTHROPLASTY  2008   on left per Dr. Wynelle Link    Family History  Problem Relation Age of Onset  . Heart disease Mother   . Thyroid disease Mother   . Parkinsonism Father   . Thyroid disease Sister   . Diabetes Maternal Uncle     Social History:  reports that he quit smoking about 19 years ago. His smoking use included cigarettes. He has a 14.00 pack-year smoking history. He has never used smokeless tobacco. He reports that he does not drink alcohol or use drugs.  Allergies:  Allergies  Allergen Reactions  . Simvastatin     Joint pain.    . Sulfamethoxazole     REACTION: hives    Allergies as of 06/14/2018      Reactions   Simvastatin    Joint pain.     Sulfamethoxazole    REACTION: hives      Medication List        Accurate as of 06/14/18  9:19 AM. Always use your most recent med list.          acyclovir 200 MG capsule Commonly known as:  ZOVIRAX Take 1 capsule (200 mg total) by mouth 5 (five) times daily. Fever blisters   clomiPHENE 50 MG tablet Commonly known as:  CLOMID TAKE 1/2 (ONE-HALF) TABLET BY MOUTH TWICE A WEEK   diphenhydrAMINE 25 MG tablet Commonly known as:  BENADRYL Take 25 mg by mouth at bedtime as needed for sleep. For sleep.   ezetimibe 10 MG tablet Commonly known as:  ZETIA Take 1 tablet (10 mg total) by mouth daily.   hydrochlorothiazide 25 MG tablet Commonly known as:  HYDRODIURIL Take 1 tablet (25 mg total) by mouth daily.   ketoconazole 2 % cream Commonly known as:  NIZORAL Apply 1 application topically 2 (two) times daily.   levothyroxine 150 MCG tablet Commonly known as:  SYNTHROID,  LEVOTHROID Take 1.5 tablets on Sunday, then one tablet every other day   losartan 100 MG tablet Commonly known as:  COZAAR Take 1 tablet (100 mg total) by mouth daily.   meloxicam 15 MG tablet Commonly known as:  MOBIC Take 1 tablet (15 mg total) by mouth daily as needed.   methocarbamol 500 MG tablet Commonly known as:  ROBAXIN Take 1 tablet (500 mg total) by mouth every 6 (six) hours as needed for muscle spasms.   metoprolol succinate 50 MG 24 hr tablet Commonly known as:  TOPROL-XL Take 1 tablet (50 mg total) by mouth daily. Take with or immediately following a meal.   mometasone 0.1 % cream Commonly known as:  ELOCON Apply topically as needed.   POTASSIUM CHLORIDE PO Take 99 mg by mouth.   STOOL SOFTENER LAXATIVE PO Take by mouth. 3 times weekly- Tuesday, Thursday, Saturday   TYLENOL ARTHRITIS PAIN 650 MG CR tablet Generic drug:  acetaminophen Take 650 mg by mouth every 8 (eight) hours as needed for pain.        Review of Systems       A1c 6.5 and he was sent for a glucose tolerance test    This now shows that he has diabetes with the 2-hour reading of 213, fasting on 118 He says he is starting to cut back on his sweets, his wife was present on his previous visit He has been encouraged to do some exercise but he has not done much lately also having some knee joint pain   Wt Readings from Last 3 Encounters:  06/14/18 246 lb (111.6 kg)  04/15/18 248 lb 12.8 oz (112.9 kg)  04/05/18 252 lb (114.3 kg)     Lab Results  Component Value Date   HGBA1C 6.5 04/11/2018   HGBA1C 6.5 10/05/2017   HGBA1C 6.5 04/08/2017   Lab Results  Component Value Date   LDLCALC 104 (H) 11/08/2017   CREATININE 1.09 11/08/2017     Examination:    BP (!) 164/78 (BP Location: Left Arm, Patient Position: Sitting, Cuff Size: Normal)   Pulse 62   Ht 5\' 9"  (1.753 m)   Wt 246 lb (111.6 kg)   SpO2 97%   BMI 36.33 kg/m     Assessment:  DIABETES:  His glucose tolerance  tests clearly shows he has diabetes with 2-hour reading over 200 A1c 6.5 previously Discussed causation of diabetes, insulin resistance, need for weight loss and progression of insulin deficiency   HYPOGONADOTROPIC hypogonadism   He has had good response to low-dose clomiphene 25 mg twice a week subjectively doing well Last testosterone level normal   HYPOTHYROIDISM, long-standing and secondary to autoimmune disease with strong family History  Although his dose was reduced on the last visit by his TSH is still low This is without change in the way he is taking his medication and is on brand-name Synthroid Subjectively doing fairly well   PLAN:  Start metformin. Discussed how this works, benefits and long-term effects on insulin resistance and blood sugar control patient handout and instructions on the dosage given Check A1c on the next visit Encouraged him to start exercise program including considering water aerobics  He will need to reduce his thyroid dosage again Since he is concerned about the cost he can try his Synthroid as a generic and take 125 mcg daily  Written prescription given for all his medications since he has difficulty with [facility] filling the prescriptions online  Patient Instructions  Start taking Metformin 500 mg, 1 tablet with your main meal for 5 days. Occasionally this may initially cause loose stools or nausea. If  tolerating well after 5 days add a second Metformin tablet (500 mg) at the same time.  Continue adding another tablet after 5 days days if no persistent nausea or diarrhea until reaching the maximum tolerated dose or the full dose  of 3 tablets once daily.    Elayne Snare 06/14/2018, 9:19 AM

## 2018-07-06 ENCOUNTER — Telehealth: Payer: Self-pay | Admitting: Family Medicine

## 2018-07-06 MED ORDER — HYDROCODONE-ACETAMINOPHEN 10-325 MG PO TABS: 1.0000 | ORAL_TABLET | ORAL | 0 refills | Status: DC | PRN

## 2018-07-06 NOTE — Telephone Encounter (Signed)
Done

## 2018-08-04 ENCOUNTER — Ambulatory Visit (INDEPENDENT_AMBULATORY_CARE_PROVIDER_SITE_OTHER): Payer: BLUE CROSS/BLUE SHIELD

## 2018-08-04 ENCOUNTER — Ambulatory Visit: Payer: BLUE CROSS/BLUE SHIELD

## 2018-08-04 DIAGNOSIS — Z23 Encounter for immunization: Secondary | ICD-10-CM

## 2018-08-10 ENCOUNTER — Telehealth: Payer: Self-pay | Admitting: Endocrinology

## 2018-08-10 MED ORDER — SITAGLIPTIN PHOSPHATE 100 MG PO TABS: 100.0000 mg | ORAL_TABLET | Freq: Every day | ORAL | 5 refills | Status: DC

## 2018-08-10 NOTE — Addendum Note (Signed)
Addended by: Benson Setting L on: 08/10/2018 03:06 PM   Modules accepted: Orders

## 2018-08-10 NOTE — Telephone Encounter (Signed)
Let's try Januvia 100 mg before b'fast instead. Please send 30 tablets with 5 refills.

## 2018-08-10 NOTE — Telephone Encounter (Signed)
Patient called re: patient is having problems with Metformin. Patient is experiencing diahrrea, severe sweating. Patient states he cannot take Metformin. Patient would like to be advised on if he should be taking a different medication. Patient cannot take any medication that causes diahrrea due to severe dehydration. Please call patient at ph# [phone] to advise

## 2018-08-10 NOTE — Telephone Encounter (Signed)
Rx was sent into pharmacy of choice. Nothing further is needed

## 2018-08-10 NOTE — Telephone Encounter (Signed)
Pt is aware of the januvia and to stop the metformin.  Please call the januvia into [facility] on [address] [phone]

## 2018-08-12 ENCOUNTER — Telehealth: Payer: Self-pay | Admitting: Endocrinology

## 2018-08-12 NOTE — Telephone Encounter (Signed)
Patient stated that a new medication was sent into the pharmacy instead of the metformin .  he said he could not afford to pay over $400 for 30 pills Patient is coming in to see Dr Dwyane Dee in two weeks and asked if he needed to wait until then to see what he should do.   Please advise

## 2018-08-15 ENCOUNTER — Ambulatory Visit (INDEPENDENT_AMBULATORY_CARE_PROVIDER_SITE_OTHER): Payer: BLUE CROSS/BLUE SHIELD | Admitting: Family Medicine

## 2018-08-15 ENCOUNTER — Encounter: Payer: Self-pay | Admitting: Family Medicine

## 2018-08-15 ENCOUNTER — Other Ambulatory Visit: Payer: Self-pay

## 2018-08-15 VITALS — BP 124/68 | HR 78 | Temp 98.2°F | Ht 69.0 in | Wt 240.9 lb

## 2018-08-15 DIAGNOSIS — J069 Acute upper respiratory infection, unspecified: Secondary | ICD-10-CM

## 2018-08-15 MED ORDER — AZITHROMYCIN 250 MG PO TABS: ORAL_TABLET | ORAL | 0 refills | Status: AC

## 2018-08-15 NOTE — Progress Notes (Signed)
  Subjective:     Patient ID: Luke Skinner, male   DOB: 01-22-1956, 62 y.o.   MRN: [id number]  HPI Patient seen with upper respiratory infection symptoms.  He started with sore throat last Thursday.  He then developed some postnasal drainage.  Now has occasionally productive cough.  He thinks he may have had some low-grade fever intermittently but has not taken temperature.  No nausea or vomiting.  No sick contacts.  Does have some increased sinus pressure.  Past Medical History:  Diagnosis Date  . Allergy   . Arthritis    oa  . Herpes stomatitis    fever blisters occasional  . History of kidney stones   . Hyperlipidemia   . Hypertension   . Hypothyroidism   . Pneumonia 2006 last time   several times   . Rash    below both knees for many years uses selsum blue to wash with   Past Surgical History:  Procedure Laterality Date  . COLONOSCOPY  12-09-10   per Dr. Henrene Pastor, repeat in 10 yrs  . ESOPHAGUS SURGERY     as infant   . GASTROSTOMY  1989   per Dr. Annamaria Boots  . HERNIA REPAIR     left inguinal   . KNEE ARTHROSCOPY Left 1973, 1975, 1990 and 2003  . NASAL SEPTUM SURGERY    . SHOULDER ARTHROSCOPY  2005   on right per Dr. Wynelle Link  . TOTAL HIP ARTHROPLASTY Right 10/21/2016   Procedure: RIGHT TOTAL HIP ARTHROPLASTY ANTERIOR APPROACH;  Surgeon: Gaynelle Arabian, MD;  Location: [facility];  Service: Orthopedics;  Laterality: Right;  . TOTAL KNEE ARTHROPLASTY  2008   on left per Dr. Wynelle Link    reports that he quit smoking about 19 years ago. His smoking use included cigarettes. He has a 14.00 pack-year smoking history. He has never used smokeless tobacco. He reports that he does not drink alcohol or use drugs. family history includes Diabetes in his maternal uncle; Heart disease in his mother; Parkinsonism in his father; Thyroid disease in his mother and sister. Allergies  Allergen Reactions  . Simvastatin     Joint pain.    . Sulfamethoxazole     REACTION: hives     Review of Systems   Constitutional: Negative for fever.  HENT: Positive for congestion and sore throat. Negative for ear pain and sinus pain.   Respiratory: Positive for cough.        Objective:   Physical Exam  Constitutional: He appears well-developed and well-nourished.  HENT:  Right Ear: Tympanic membrane normal.  Left Ear: Tympanic membrane normal.  Mouth/Throat: Oropharynx is clear and moist and mucous membranes are normal. No oropharyngeal exudate.  Neck: Neck supple.  Cardiovascular: Normal rate and regular rhythm.  Pulmonary/Chest: Effort normal and breath sounds normal. He has no wheezes. He has no rales.  Lymphadenopathy:    He has no cervical adenopathy.       Assessment:     Upper respiratory infection.  Suspect probably viral.  Patient states he cannot "shake "infections like this in the past without antibiotics.  We explained the difference between viral and bacterial infections    Plan:     -Recommend plenty of rest and fluids. -We did print prescription for Zithromax to start if he has any progressive or persistent symptoms-otherwise treat symptomatically  Eulas Post MD [facility]

## 2018-08-15 NOTE — Patient Instructions (Signed)
Upper Respiratory Infection, Adult Most upper respiratory infections (URIs) are a viral infection of the air passages leading to the lungs. A URI affects the nose, throat, and upper air passages. The most common type of URI is nasopharyngitis and is typically referred to as "the common cold." URIs run their course and usually go away on their own. Most of the time, a URI does not require medical attention, but sometimes a bacterial infection in the upper airways can follow a viral infection. This is called a secondary infection. Sinus and middle ear infections are common types of secondary upper respiratory infections. Bacterial pneumonia can also complicate a URI. A URI can worsen asthma and chronic obstructive pulmonary disease (COPD). Sometimes, these complications can require emergency medical care and may be life threatening. What are the causes? Almost all URIs are caused by viruses. A virus is a type of germ and can spread from one person to another. What increases the risk? You may be at risk for a URI if:  You smoke.  You have chronic heart or lung disease.  You have a weakened defense (immune) system.  You are very young or very old.  You have nasal allergies or asthma.  You work in crowded or poorly ventilated areas.  You work in health care facilities or schools.  What are the signs or symptoms? Symptoms typically develop 2-3 days after you come in contact with a cold virus. Most viral URIs last 7-10 days. However, viral URIs from the influenza virus (flu virus) can last 14-18 days and are typically more severe. Symptoms may include:  Runny or stuffy (congested) nose.  Sneezing.  Cough.  Sore throat.  Headache.  Fatigue.  Fever.  Loss of appetite.  Pain in your forehead, behind your eyes, and over your cheekbones (sinus pain).  Muscle aches.  How is this diagnosed? Your health care provider may diagnose a URI by:  Physical exam.  Tests to check that your  symptoms are not due to another condition such as: ? Strep throat. ? Sinusitis. ? Pneumonia. ? Asthma.  How is this treated? A URI goes away on its own with time. It cannot be cured with medicines, but medicines may be prescribed or recommended to relieve symptoms. Medicines may help:  Reduce your fever.  Reduce your cough.  Relieve nasal congestion.  Follow these instructions at home:  Take medicines only as directed by your health care provider.  Gargle warm saltwater or take cough drops to comfort your throat as directed by your health care provider.  Use a warm mist humidifier or inhale steam from a shower to increase air moisture. This may make it easier to breathe.  Drink enough fluid to keep your urine clear or pale yellow.  Eat soups and other clear broths and maintain good nutrition.  Rest as needed.  Return to work when your temperature has returned to normal or as your health care provider advises. You may need to stay home longer to avoid infecting others. You can also use a face mask and careful hand washing to prevent spread of the virus.  Increase the usage of your inhaler if you have asthma.  Do not use any tobacco products, including cigarettes, chewing tobacco, or electronic cigarettes. If you need help quitting, ask your health care provider. How is this prevented? The best way to protect yourself from getting a cold is to practice good hygiene.  Avoid oral or hand contact with people with cold symptoms.  Wash your   hands often if contact occurs.  There is no clear evidence that vitamin C, vitamin E, echinacea, or exercise reduces the chance of developing a cold. However, it is always recommended to get plenty of rest, exercise, and practice good nutrition. Contact a health care provider if:  You are getting worse rather than better.  Your symptoms are not controlled by medicine.  You have chills.  You have worsening shortness of breath.  You have  brown or red mucus.  You have yellow or brown nasal discharge.  You have pain in your face, especially when you bend forward.  You have a fever.  You have swollen neck glands.  You have pain while swallowing.  You have white areas in the back of your throat. Get help right away if:  You have severe or persistent: ? Headache. ? Ear pain. ? Sinus pain. ? Chest pain.  You have chronic lung disease and any of the following: ? Wheezing. ? Prolonged cough. ? Coughing up blood. ? A change in your usual mucus.  You have a stiff neck.  You have changes in your: ? Vision. ? Hearing. ? Thinking. ? Mood. This information is not intended to replace advice given to you by your health care provider. Make sure you discuss any questions you have with your health care provider. Document Released: 03/31/2001 Document Revised: 06/07/2016 Document Reviewed: 01/10/2014 Elsevier Interactive Patient Education  2018 [employer]  

## 2018-08-15 NOTE — Telephone Encounter (Signed)
Dr. Cruzita Lederer please advise, this pt called last week on 08/10/18 and stated that he was having sx from his Metformin. She advised for pt to switch to Januvia. Well pt has called back (please see message below). He also wanted to mention that the pharmacy suggested possiblly trying metformin xr.

## 2018-08-15 NOTE — Telephone Encounter (Signed)
Metformin XR is on his medication list now.  Was this recently changed?  If he cannot afford Januvia and he was previously on regular metformin, we can definitely try metformin XR.  In this case, he can try again with 1 tablet a day and build it up to 3 tablets with meals, as tolerated.

## 2018-08-16 NOTE — Telephone Encounter (Signed)
Left vm to call back

## 2018-08-16 NOTE — Telephone Encounter (Signed)
Noted. Ok to wait until he can see Dr. Dwyane Dee.

## 2018-08-16 NOTE — Telephone Encounter (Signed)
Dr. Cruzita Lederer, I spoke with pt and he stated he did not realize that the XR was the same medication he has already been on. He states he originally started off with 1 tab daily and then after a week went to 2 then after that went to three. Per pt when he couldn't tolerate the 3 tabs he went back to 2 tabs and then eventually back to one and still no change. He states he has an appt next Friday with Dwyane Dee and doesn't know should he just wait until he comes in to further discuss.

## 2018-08-17 NOTE — Telephone Encounter (Signed)
Spoke with pt and informed him of Dr. Chrissie Noa message. Pt understood and had no additional questions at this time.

## 2018-08-30 ENCOUNTER — Other Ambulatory Visit (INDEPENDENT_AMBULATORY_CARE_PROVIDER_SITE_OTHER): Payer: BLUE CROSS/BLUE SHIELD

## 2018-08-30 DIAGNOSIS — E063 Autoimmune thyroiditis: Secondary | ICD-10-CM | POA: Diagnosis not present

## 2018-08-30 DIAGNOSIS — E230 Hypopituitarism: Secondary | ICD-10-CM | POA: Diagnosis not present

## 2018-08-30 DIAGNOSIS — E1169 Type 2 diabetes mellitus with other specified complication: Secondary | ICD-10-CM | POA: Diagnosis not present

## 2018-08-30 DIAGNOSIS — E669 Obesity, unspecified: Secondary | ICD-10-CM

## 2018-08-30 LAB — BASIC METABOLIC PANEL
BUN: 16 mg/dL (ref 6–23)
CHLORIDE: 103 meq/L (ref 96–112)
CO2: 28 meq/L (ref 19–32)
Calcium: 9.7 mg/dL (ref 8.4–10.5)
Creatinine, Ser: 0.98 mg/dL (ref 0.40–1.50)
GFR: 82.19 mL/min (ref >60.00)
GLUCOSE: 112 mg/dL — AB (ref 70–99)
POTASSIUM: 4.2 meq/L (ref 3.5–5.1)
SODIUM: 140 meq/L (ref 135–145)

## 2018-08-30 LAB — TESTOSTERONE: TESTOSTERONE: 766.76 ng/dL (ref 300.00–890.00)

## 2018-08-30 LAB — T4, FREE: Free T4: 1.24 ng/dL (ref 0.60–1.60)

## 2018-08-30 LAB — HEMOGLOBIN A1C: HEMOGLOBIN A1C: 6.2 % (ref 4.6–6.5)

## 2018-08-30 LAB — TSH: TSH: 0.01 u[IU]/mL — AB (ref 0.35–4.50)

## 2018-09-01 NOTE — Progress Notes (Signed)
Patient ID: Luke Skinner, male   DOB: 18-Mar-1956, 62 y.o.   MRN: [id number]            Reason for visit: Endocrinology follow-up    History of Present Illness:   Hypothyroidism was first diagnosed  when he was in his 38s. At the time of diagnosis patient was having symptoms of fatigue, difficulty with weight loss but does not remember other symptoms  The patient has been treated with  levothyroxine 150 g for most of the duration of his hypothyroidism With starting thyroid supplementation the patient's symptoms had improved  He has been mostly taking brand-name Synthroid for more consistent results However his thyroid levels have been fluctuating over the last several months In 12/18 he was hypothyroid with 150 mcg but this year he has had low TSH levels  Even with reducing the dose to 125 mcg he is still having a significantly low TSH and slightly higher free T4 Also he has been changed to generic instead of brand name Synthroid because of cost He is not complaining of shakiness, palpitations or excessive weight loss  He is usually taking his supplement before breakfast  Weight history:  Wt Readings from Last 3 Encounters:  09/02/18 241 lb (109.3 kg)  08/15/18 240 lb 14.4 oz (109.3 kg)  06/14/18 246 lb (111.6 kg)    His TSH level is low at 0.01:  Lab Results  Component Value Date   TSH 0.01 (L) 08/30/2018   TSH 0.02 (L) 06/10/2018   TSH 0.10 (L) 04/11/2018   FREET4 1.24 08/30/2018   FREET4 1.01 06/10/2018   FREET4 1.50 04/11/2018    Other problems:   DIABETES: See review of systems   Hypogonadotrophic HYPOGONADISM:  He was having significant amount of fatigue, lack of energy in 2011 and because of his low testosterone level he was given AndroGel. He does not know if he felt better with this as he did not take it for long.  He stopped it apparently because his PSA went up to 4.5  He was seen in consultation with symptoms of feeling tired and decreased  motivation He also has had decreased libido long-standing Also has had long-standing erectile dysfunction of unclear etiology  Although his total testosterone was normal his free testosterone was low at baseline at 5.6 Since his LH and prolactin level and baseline were in the normal range he was started on a trial of clomiphene half tablet 3 times a week of 50 mg  Recent history: His clomiphene was reduced to twice a week in 12/18 when the level was over 700 With clomiphene  twice a week his testosterone level has been still normal but recently further improved over 700 No fatigue currently He does have a high SHBG   Lab Results  Component Value Date   TESTOSTERONE 766.76 08/30/2018   TESTOSTERONE 453.41 04/11/2018   TESTOSTERONE 712 10/05/2017   TESTOSTERONE 393.77 04/08/2017    Lab on 08/30/2018  Component Date Value Ref Range Status  . Testosterone 08/30/2018 766.76  300.00 - 890.00 ng/dL Final  . TSH 08/30/2018 0.01* 0.35 - 4.50 uIU/mL Final  . Free T4 08/30/2018 1.24  0.60 - 1.60 ng/dL Final   Comment: Specimens from patients who are undergoing biotin therapy and /or ingesting biotin supplements may contain high levels of biotin.  The higher biotin concentration in these specimens interferes with this Free T4 assay.  Specimens that contain high levels  of biotin may cause false high results for this Free  T4 assay.  Please interpret results in light of the total clinical presentation of the patient.    . Hgb A1c MFr Bld 08/30/2018 6.2  4.6 - 6.5 % Final   Glycemic Control Guidelines for People with Diabetes:Non Diabetic:  <6%Goal of Therapy: <7%Additional Action Suggested:  >8%   . Sodium 08/30/2018 140  135 - 145 mEq/L Final  . Potassium 08/30/2018 4.2  3.5 - 5.1 mEq/L Final  . Chloride 08/30/2018 103  96 - 112 mEq/L Final  . CO2 08/30/2018 28  19 - 32 mEq/L Final  . Glucose, Bld 08/30/2018 112* 70 - 99 mg/dL Final  . BUN 08/30/2018 16  6 - 23 mg/dL Final  . Creatinine, Ser  08/30/2018 0.98  0.40 - 1.50 mg/dL Final  . Calcium 08/30/2018 9.7  8.4 - 10.5 mg/dL Final  . GFR 08/30/2018 82.19  >60.00 mL/min Final      Past Medical History:  Diagnosis Date  . Allergy   . Arthritis    oa  . Herpes stomatitis    fever blisters occasional  . History of kidney stones   . Hyperlipidemia   . Hypertension   . Hypothyroidism   . Pneumonia 2006 last time   several times   . Rash    below both knees for many years uses selsum blue to wash with    Past Surgical History:  Procedure Laterality Date  . COLONOSCOPY  12-09-10   per Dr. Henrene Pastor, repeat in 10 yrs  . ESOPHAGUS SURGERY     as infant   . GASTROSTOMY  1989   per Dr. Annamaria Boots  . HERNIA REPAIR     left inguinal   . KNEE ARTHROSCOPY Left 1973, 1975, 1990 and 2003  . NASAL SEPTUM SURGERY    . SHOULDER ARTHROSCOPY  2005   on right per Dr. Wynelle Link  . TOTAL HIP ARTHROPLASTY Right 10/21/2016   Procedure: RIGHT TOTAL HIP ARTHROPLASTY ANTERIOR APPROACH;  Surgeon: Gaynelle Arabian, MD;  Location: [facility];  Service: Orthopedics;  Laterality: Right;  . TOTAL KNEE ARTHROPLASTY  2008   on left per Dr. Wynelle Link    Family History  Problem Relation Age of Onset  . Heart disease Mother   . Thyroid disease Mother   . Parkinsonism Father   . Thyroid disease Sister   . Diabetes Maternal Uncle     Social History:  reports that he quit smoking about 19 years ago. His smoking use included cigarettes. He has a 14.00 pack-year smoking history. He has never used smokeless tobacco. He reports that he does not drink alcohol or use drugs.  Allergies:  Allergies  Allergen Reactions  . Simvastatin     Joint pain.    . Sulfamethoxazole     REACTION: hives    Allergies as of 09/02/2018      Reactions   Simvastatin    Joint pain.     Sulfamethoxazole    REACTION: hives      Medication List        Accurate as of 09/02/18  9:24 AM. Always use your most recent med list.          acyclovir 200 MG capsule Commonly known  as:  ZOVIRAX Take 1 capsule (200 mg total) by mouth 5 (five) times daily. Fever blisters   clomiPHENE 50 MG tablet Commonly known as:  CLOMID TAKE 1/2 (ONE-HALF) TABLET BY MOUTH TWICE A WEEK   diphenhydrAMINE 25 MG tablet Commonly known as:  BENADRYL Take 25 mg by  mouth at bedtime as needed for sleep. For sleep.   ezetimibe 10 MG tablet Commonly known as:  ZETIA Take 1 tablet (10 mg total) by mouth daily.   hydrochlorothiazide 25 MG tablet Commonly known as:  HYDRODIURIL Take 1 tablet (25 mg total) by mouth daily.   HYDROcodone-acetaminophen 10-325 MG tablet Commonly known as:  NORCO Take 1 tablet by mouth every 4 (four) hours as needed for moderate pain.   ketoconazole 2 % cream Commonly known as:  NIZORAL Apply 1 application topically 2 (two) times daily.   levothyroxine 100 MCG tablet Commonly known as:  SYNTHROID, LEVOTHROID Take 1 tablet (100 mcg total) by mouth daily.   losartan 100 MG tablet Commonly known as:  COZAAR Take 1 tablet (100 mg total) by mouth daily.   meloxicam 15 MG tablet Commonly known as:  MOBIC Take 1 tablet (15 mg total) by mouth daily as needed.   methocarbamol 500 MG tablet Commonly known as:  ROBAXIN Take 1 tablet (500 mg total) by mouth every 6 (six) hours as needed for muscle spasms.   metoprolol succinate 50 MG 24 hr tablet Commonly known as:  TOPROL-XL Take 1 tablet (50 mg total) by mouth daily. Take with or immediately following a meal.   mometasone 0.1 % cream Commonly known as:  ELOCON Apply topically as needed.   POTASSIUM CHLORIDE PO Take 99 mg by mouth.   STOOL SOFTENER LAXATIVE PO Take by mouth. 3 times weekly- Tuesday, Thursday, Saturday   TYLENOL ARTHRITIS PAIN 650 MG CR tablet Generic drug:  acetaminophen Take 650 mg by mouth every 8 (eight) hours as needed for pain.        Review of Systems       DIABETES:   A1c the last 2 times had been 6.5 and he was sent for a glucose tolerance test    This showed  diabetes with 2-hour glucose of 213 and fasting 10/19/2016 He was tried on metformin but he has significant diarrhea even with 1 or 2 tablets However he is cutting back on carbohydrate, sweets and on and off doing some walking or other exercise like playing golf  He is losing weight  Fasting glucose is 112 and A1c is improved at 6.2   Wt Readings from Last 3 Encounters:  09/02/18 241 lb (109.3 kg)  08/15/18 240 lb 14.4 oz (109.3 kg)  06/14/18 246 lb (111.6 kg)     Lab Results  Component Value Date   HGBA1C 6.2 08/30/2018   HGBA1C 6.5 04/11/2018   HGBA1C 6.5 10/05/2017   Lab Results  Component Value Date   LDLCALC 104 (H) 11/08/2017   CREATININE 0.98 08/30/2018     Examination:    BP 134/76   Pulse 76   Ht 5\' 9"  (1.753 m)   Wt 241 lb (109.3 kg)   SpO2 96%   BMI 35.59 kg/m    Thyroid nonpalpable No tremor Biceps reflexes difficult to elicit No peripheral edema  Assessment:  DIABETES:  His blood sugars are improving with some lifestyle changes and a little weight loss A1c is down to 6.2 from 6.5 Fasting glucose slightly better at 112  He wants to continue efforts to lose weight with lifestyle changes and will have him follow-up with his PCP in January also   HYPOGONADOTROPIC hypogonadism   He has had significant response to low-dose clomiphene 25 mg twice a week He has no fatigue His testosterone level is over 700 now compared to 400+ This may be improved  from his weight loss and lifestyle changes   HYPOTHYROIDISM, long-standing and secondary to autoimmune disease with strong family History  Although his dose has been reduced gradually his TSH is still very low And appears to be requiring smaller doses of supplement Currently asymptomatic   PLAN:    May leave off metformin  Consistent diet and exercise, he appears to be doing fairly well on his own but can see the dietitian if needed  No need for home glucose monitoring at this stage  Reduce  levothyroxine to 100 mcg  We will try him off clomiphene to see if the still has good energy level and normal free testosterone level with his being able to lose weight, he will call if he has any unusual fatigue  To have labs done with PCP and to call us if he needs to be seen at that point after his physical, will have his thyroid levels and testosterone as well as fasting glucose checked  Written prescription given for levothyroxine since he has difficulty with [facility] filling the prescriptions online  Patient Instructions  No clomiphene  Walk daily   Total visit time for evaluation and management of multiple problems and counseling =25 minutes  Elayne Snare 09/02/2018, 9:24 AM

## 2018-09-02 ENCOUNTER — Ambulatory Visit (INDEPENDENT_AMBULATORY_CARE_PROVIDER_SITE_OTHER): Payer: BLUE CROSS/BLUE SHIELD | Admitting: Endocrinology

## 2018-09-02 ENCOUNTER — Encounter: Payer: Self-pay | Admitting: Endocrinology

## 2018-09-02 VITALS — BP 134/76 | HR 76 | Ht 69.0 in | Wt 241.0 lb

## 2018-09-02 DIAGNOSIS — E669 Obesity, unspecified: Secondary | ICD-10-CM

## 2018-09-02 DIAGNOSIS — E063 Autoimmune thyroiditis: Secondary | ICD-10-CM

## 2018-09-02 DIAGNOSIS — E1169 Type 2 diabetes mellitus with other specified complication: Secondary | ICD-10-CM

## 2018-09-02 DIAGNOSIS — E230 Hypopituitarism: Secondary | ICD-10-CM

## 2018-09-02 MED ORDER — LEVOTHYROXINE SODIUM 100 MCG PO TABS: 100.0000 ug | ORAL_TABLET | Freq: Every day | ORAL | 3 refills | Status: DC

## 2018-09-02 NOTE — Patient Instructions (Addendum)
No clomiphene  Walk daily

## 2018-09-22 ENCOUNTER — Telehealth: Payer: Self-pay | Admitting: Family Medicine

## 2018-09-22 NOTE — Telephone Encounter (Signed)
Copied from [place] [id number]. Topic: Quick Communication - See Telephone Encounter >> Sep 22, 2018  4:52 PM Blase Mess A wrote: CRM for notification. See Telephone encounter for: 09/22/18.  Patient is calling because he is going to [facility].  He is wondering if Dr. Sarajane Jews Could write another script for his  all of his medications including HYDROcodone-acetaminophen (NORCO) 10-325 MG tablet [[id number]]  because he does not know when he will be able to get a new pcp due to insurance purposes. Please advise [phone]  Patient is also seeing if Dr. Sarajane Jews could recommend a Dr in the [facility] system.

## 2018-09-26 NOTE — Telephone Encounter (Signed)
Pt asking for refills on all his medications, including hydrocodone. Pt is having to find a new PCP in the [facility] network due to insurance changes. He would like to know if there is anyone you would recommend?

## 2018-09-27 NOTE — Telephone Encounter (Signed)
Dr Fry - FYI. Thanks! 

## 2018-09-27 NOTE — Telephone Encounter (Signed)
Patient's wife, along with patient in the background, calling to check the status of a call back from Dr Sarajane Jews. Informed patient's wife, and patient, that Dr Sarajane Jews is currently in with patient's, but the message was sent to him high priority, we are just waiting for him to advise. Would like a call back when possible.

## 2018-09-28 ENCOUNTER — Other Ambulatory Visit: Payer: Self-pay | Admitting: Family Medicine

## 2018-09-28 MED ORDER — LEVOTHYROXINE SODIUM 100 MCG PO TABS: 100.0000 ug | ORAL_TABLET | Freq: Every day | ORAL | 1 refills | Status: DC

## 2018-09-28 MED ORDER — ACYCLOVIR 200 MG PO CAPS: 200.0000 mg | ORAL_CAPSULE | Freq: Every day | ORAL | 1 refills | Status: DC

## 2018-09-28 MED ORDER — HYDROCODONE-ACETAMINOPHEN 10-325 MG PO TABS: 1.0000 | ORAL_TABLET | ORAL | 0 refills | Status: DC | PRN

## 2018-09-28 MED ORDER — MELOXICAM 15 MG PO TABS: 15.0000 mg | ORAL_TABLET | Freq: Every day | ORAL | 1 refills | Status: DC | PRN

## 2018-09-28 MED ORDER — METOPROLOL SUCCINATE ER 50 MG PO TB24: 50.0000 mg | ORAL_TABLET | Freq: Every day | ORAL | 1 refills | Status: DC

## 2018-09-28 MED ORDER — METHOCARBAMOL 500 MG PO TABS: 500.0000 mg | ORAL_TABLET | Freq: Four times a day (QID) | ORAL | 2 refills | Status: DC | PRN

## 2018-09-28 MED ORDER — HYDROCHLOROTHIAZIDE 25 MG PO TABS: 25.0000 mg | ORAL_TABLET | Freq: Every day | ORAL | 1 refills | Status: DC

## 2018-09-28 MED ORDER — LOSARTAN POTASSIUM 100 MG PO TABS: 100.0000 mg | ORAL_TABLET | Freq: Every day | ORAL | 1 refills | Status: DC

## 2018-09-28 MED ORDER — EZETIMIBE 10 MG PO TABS: 10.0000 mg | ORAL_TABLET | Freq: Every day | ORAL | 1 refills | Status: DC

## 2018-09-28 NOTE — Telephone Encounter (Signed)
These have been printed out and are ready for pick up. Tell him that I do not know anyone in the [facility] network to recommend him to

## 2018-09-28 NOTE — Telephone Encounter (Signed)
Spoke with pt and advised. He will come get rx's. Nothing further needed at this time.

## 2018-11-09 ENCOUNTER — Encounter: Payer: BLUE CROSS/BLUE SHIELD | Admitting: Family Medicine

## 2018-11-09 ENCOUNTER — Telehealth: Payer: Self-pay | Admitting: Family Medicine

## 2018-11-09 MED ORDER — AZITHROMYCIN 250 MG PO TABS: ORAL_TABLET | ORAL | 0 refills | Status: DC

## 2018-11-09 MED ORDER — HYDROCODONE-HOMATROPINE 5-1.5 MG/5ML PO SYRP: 5.0000 mL | ORAL_SOLUTION | ORAL | 0 refills | Status: DC | PRN

## 2018-11-10 NOTE — Telephone Encounter (Signed)
Done

## 2019-01-11 ENCOUNTER — Ambulatory Visit: Payer: BLUE CROSS/BLUE SHIELD | Admitting: Endocrinology

## 2019-04-26 ENCOUNTER — Other Ambulatory Visit: Payer: Self-pay | Admitting: Family Medicine

## 2019-08-14 ENCOUNTER — Ambulatory Visit: Payer: BLUE CROSS/BLUE SHIELD

## 2020-12-03 ENCOUNTER — Encounter: Payer: Self-pay | Admitting: Internal Medicine

## 2021-01-03 DIAGNOSIS — Z96652 Presence of left artificial knee joint: Secondary | ICD-10-CM | POA: Diagnosis not present

## 2021-01-03 DIAGNOSIS — M7062 Trochanteric bursitis, left hip: Secondary | ICD-10-CM | POA: Diagnosis not present

## 2021-01-03 DIAGNOSIS — Z96641 Presence of right artificial hip joint: Secondary | ICD-10-CM | POA: Diagnosis not present

## 2021-01-13 ENCOUNTER — Ambulatory Visit (INDEPENDENT_AMBULATORY_CARE_PROVIDER_SITE_OTHER): Payer: PPO | Admitting: Family Medicine

## 2021-01-13 ENCOUNTER — Other Ambulatory Visit: Payer: Self-pay

## 2021-01-13 ENCOUNTER — Encounter: Payer: Self-pay | Admitting: Family Medicine

## 2021-01-13 VITALS — BP 120/70 | HR 56 | Temp 98.2°F | Ht 69.0 in | Wt 245.0 lb

## 2021-01-13 DIAGNOSIS — E039 Hypothyroidism, unspecified: Secondary | ICD-10-CM

## 2021-01-13 DIAGNOSIS — R7301 Impaired fasting glucose: Secondary | ICD-10-CM

## 2021-01-13 DIAGNOSIS — Z0000 Encounter for general adult medical examination without abnormal findings: Secondary | ICD-10-CM | POA: Diagnosis not present

## 2021-01-13 DIAGNOSIS — N200 Calculus of kidney: Secondary | ICD-10-CM | POA: Diagnosis not present

## 2021-01-13 LAB — LIPID PANEL
Cholesterol: 182 mg/dL (ref 0–200)
HDL: 56.7 mg/dL (ref >39.00)
LDL Cholesterol: 105 mg/dL — ABNORMAL HIGH (ref 0–99)
NonHDL: 125.43
Total CHOL/HDL Ratio: 3
Triglycerides: 100 mg/dL (ref 0.0–149.0)
VLDL: 20 mg/dL (ref 0.0–40.0)

## 2021-01-13 LAB — T3, FREE: T3, Free: 2.8 pg/mL (ref 2.3–4.2)

## 2021-01-13 LAB — HEPATIC FUNCTION PANEL
ALT: 48 U/L (ref 0–53)
AST: 21 U/L (ref 0–37)
Albumin: 4.4 g/dL (ref 3.5–5.2)
Alkaline Phosphatase: 80 U/L (ref 39–117)
Bilirubin, Direct: 0.1 mg/dL (ref 0.0–0.3)
Total Bilirubin: 0.6 mg/dL (ref 0.2–1.2)
Total Protein: 6.9 g/dL (ref 6.0–8.3)

## 2021-01-13 LAB — CBC WITH DIFFERENTIAL/PLATELET
Basophils Absolute: 0 10*3/uL (ref 0.0–0.1)
Basophils Relative: 0.4 % (ref 0.0–3.0)
Eosinophils Absolute: 0.1 10*3/uL (ref 0.0–0.7)
Eosinophils Relative: 1 % (ref 0.0–5.0)
HCT: 40.8 % (ref 39.0–52.0)
Hemoglobin: 13.7 g/dL (ref 13.0–17.0)
Lymphocytes Relative: 29.3 % (ref 12.0–46.0)
Lymphs Abs: 2.6 10*3/uL (ref 0.7–4.0)
MCHC: 33.4 g/dL (ref 30.0–36.0)
MCV: 85.2 fl (ref 78.0–100.0)
Monocytes Absolute: 0.8 10*3/uL (ref 0.1–1.0)
Monocytes Relative: 9.5 % (ref 3.0–12.0)
Neutro Abs: 5.3 10*3/uL (ref 1.4–7.7)
Neutrophils Relative %: 59.8 % (ref 43.0–77.0)
Platelets: 270 10*3/uL (ref 150.0–400.0)
RBC: 4.79 Mil/uL (ref 4.22–5.81)
RDW: 14.4 % (ref 11.5–15.5)
WBC: 8.8 10*3/uL (ref 4.0–10.5)

## 2021-01-13 LAB — PSA: PSA: 1.91 ng/mL (ref 0.10–4.00)

## 2021-01-13 LAB — BASIC METABOLIC PANEL
BUN: 20 mg/dL (ref 6–23)
CO2: 30 mEq/L (ref 19–32)
Calcium: 9.9 mg/dL (ref 8.4–10.5)
Chloride: 102 mEq/L (ref 96–112)
Creatinine, Ser: 0.98 mg/dL (ref 0.40–1.50)
GFR: 81.18 mL/min (ref >60.00)
Glucose, Bld: 104 mg/dL — ABNORMAL HIGH (ref 70–99)
Potassium: 4.3 mEq/L (ref 3.5–5.1)
Sodium: 141 mEq/L (ref 135–145)

## 2021-01-13 LAB — T4, FREE: Free T4: 1.29 ng/dL (ref 0.60–1.60)

## 2021-01-13 LAB — HEMOGLOBIN A1C: Hgb A1c MFr Bld: 6.6 % — ABNORMAL HIGH (ref 4.6–6.5)

## 2021-01-13 LAB — VITAMIN B12: Vitamin B-12: 651 pg/mL (ref 211–911)

## 2021-01-13 LAB — TSH: TSH: 0.04 u[IU]/mL — ABNORMAL LOW (ref 0.35–4.50)

## 2021-01-13 MED ORDER — HYDROCODONE-ACETAMINOPHEN 10-325 MG PO TABS: 1.0000 | ORAL_TABLET | ORAL | 0 refills | Status: DC | PRN

## 2021-01-13 MED ORDER — METOPROLOL SUCCINATE ER 50 MG PO TB24: 50.0000 mg | ORAL_TABLET | Freq: Every day | ORAL | 3 refills | Status: DC

## 2021-01-13 MED ORDER — VALACYCLOVIR HCL 1 G PO TABS: 1000.0000 mg | ORAL_TABLET | Freq: Two times a day (BID) | ORAL | 3 refills | Status: DC | PRN

## 2021-01-13 MED ORDER — SILDENAFIL CITRATE 100 MG PO TABS: 100.0000 mg | ORAL_TABLET | Freq: Every day | ORAL | 5 refills | Status: DC | PRN

## 2021-01-13 MED ORDER — HYDROCODONE-ACETAMINOPHEN 10-325 MG PO TABS: 1.0000 | ORAL_TABLET | ORAL | 0 refills | Status: AC | PRN

## 2021-01-13 MED ORDER — EZETIMIBE 10 MG PO TABS: 10.0000 mg | ORAL_TABLET | Freq: Every day | ORAL | 3 refills | Status: DC

## 2021-01-13 MED ORDER — MELOXICAM 15 MG PO TABS: 15.0000 mg | ORAL_TABLET | Freq: Every day | ORAL | 3 refills | Status: DC | PRN

## 2021-01-13 MED ORDER — HYDROCHLOROTHIAZIDE 25 MG PO TABS: 25.0000 mg | ORAL_TABLET | Freq: Every day | ORAL | 3 refills | Status: DC

## 2021-01-13 MED ORDER — METHOCARBAMOL 500 MG PO TABS: 500.0000 mg | ORAL_TABLET | Freq: Four times a day (QID) | ORAL | 3 refills | Status: DC | PRN

## 2021-01-13 MED ORDER — TELMISARTAN 80 MG PO TABS: 80.0000 mg | ORAL_TABLET | Freq: Every day | ORAL | 3 refills | Status: DC

## 2021-01-13 MED ORDER — LEVOTHYROXINE SODIUM 75 MCG PO TABS: 75.0000 ug | ORAL_TABLET | Freq: Every day | ORAL | 3 refills | Status: DC

## 2021-01-13 NOTE — Progress Notes (Signed)
Subjective:    Patient ID: Luke Skinner, male    DOB: 03/11/1956, 65 y.o.   MRN: [id number]  HPI Here for a well exam. He is doing well. He saw Dr. Kathryne Eriksson the past 2 years due to his insurance, but now he is back with [place].    Review of Systems  Constitutional: Negative.   HENT: Negative.   Eyes: Negative.   Respiratory: Negative.   Cardiovascular: Negative.   Gastrointestinal: Negative.   Genitourinary: Negative.   Musculoskeletal: Negative.   Skin: Negative.   Neurological: Negative.   Psychiatric/Behavioral: Negative.        Objective:   Physical Exam Constitutional:      General: He is not in acute distress.    Appearance: He is well-developed. He is obese. He is not diaphoretic.  HENT:     Head: Normocephalic and atraumatic.     Right Ear: External ear normal.     Left Ear: External ear normal.     Nose: Nose normal.     Mouth/Throat:     Pharynx: No oropharyngeal exudate.  Eyes:     General: No scleral icterus.       Right eye: No discharge.        Left eye: No discharge.     Conjunctiva/sclera: Conjunctivae normal.     Pupils: Pupils are equal, round, and reactive to light.  Neck:     Thyroid: No thyromegaly.     Vascular: No JVD.     Trachea: No tracheal deviation.  Cardiovascular:     Rate and Rhythm: Normal rate and regular rhythm.     Heart sounds: Normal heart sounds. No murmur heard. No friction rub. No gallop.   Pulmonary:     Effort: Pulmonary effort is normal. No respiratory distress.     Breath sounds: Normal breath sounds. No wheezing or rales.  Chest:     Chest wall: No tenderness.  Abdominal:     General: Bowel sounds are normal. There is no distension.     Palpations: Abdomen is soft. There is no mass.     Tenderness: There is no abdominal tenderness. There is no guarding or rebound.     Comments: Small reducible non-tender umbilical hernia   Genitourinary:    Penis: Normal. No tenderness.      Testes: Normal.     Prostate:  Normal.     Rectum: Normal. Guaiac result negative.  Musculoskeletal:        General: No tenderness. Normal range of motion.     Cervical back: Neck supple.  Lymphadenopathy:     Cervical: No cervical adenopathy.  Skin:    General: Skin is warm and dry.     Coloration: Skin is not pale.     Findings: No erythema or rash.  Neurological:     Mental Status: He is alert and oriented to person, place, and time.     Cranial Nerves: No cranial nerve deficit.     Motor: No abnormal muscle tone.     Coordination: Coordination normal.     Deep Tendon Reflexes: Reflexes are normal and symmetric. Reflexes normal.  Psychiatric:        Behavior: Behavior normal.        Thought Content: Thought content normal.        Judgment: Judgment normal.           Assessment & Plan:  Well exam. We discussed diet and exercise. Get fasting labs. He is  scheduled for a colonoscopy in a few weeks. Alysia Penna, MD

## 2021-01-14 ENCOUNTER — Telehealth: Payer: Self-pay | Admitting: Family Medicine

## 2021-01-14 NOTE — Telephone Encounter (Signed)
Pt call and stated he want talk about his psa and want a call back next week because he will be out of Town this afternoon.

## 2021-01-14 NOTE — Telephone Encounter (Signed)
Pt chart show that lab results were reviewed with pt wife

## 2021-01-15 ENCOUNTER — Telehealth: Payer: Self-pay

## 2021-01-15 NOTE — Telephone Encounter (Signed)
Message sent to Dr Fry for advise 

## 2021-01-16 ENCOUNTER — Other Ambulatory Visit: Payer: Self-pay

## 2021-01-16 DIAGNOSIS — E039 Hypothyroidism, unspecified: Secondary | ICD-10-CM

## 2021-01-16 DIAGNOSIS — L821 Other seborrheic keratosis: Secondary | ICD-10-CM | POA: Diagnosis not present

## 2021-01-16 MED ORDER — LEVOTHYROXINE SODIUM 88 MCG PO TABS: 88.0000 ug | ORAL_TABLET | Freq: Every day | ORAL | 3 refills | Status: DC

## 2021-01-16 NOTE — Addendum Note (Signed)
Addended by: Alysia Penna A on: 01/16/2021 09:48 AM   Modules accepted: Orders

## 2021-01-16 NOTE — Telephone Encounter (Signed)
FYI Spoke with pt verbalized understanding of the changes to his Thyroid medication. Pt wants advise if it would be better to go back on the Brand thyroid medication which he took years back, state that his levels were better then and that he also felt better in general. Advised to try the new increased dose first 88 mcg.

## 2021-01-16 NOTE — Telephone Encounter (Signed)
I increased the dose of Synthroid slightly to 88 mcg daily. We should recheck labs in 90 days

## 2021-02-10 ENCOUNTER — Other Ambulatory Visit: Payer: Self-pay

## 2021-02-10 ENCOUNTER — Ambulatory Visit (AMBULATORY_SURGERY_CENTER): Payer: Self-pay | Admitting: *Deleted

## 2021-02-10 VITALS — Ht 69.0 in | Wt 251.0 lb

## 2021-02-10 DIAGNOSIS — Z1211 Encounter for screening for malignant neoplasm of colon: Secondary | ICD-10-CM

## 2021-02-10 MED ORDER — SUTAB 1479-225-188 MG PO TABS: 1.0000 | ORAL_TABLET | ORAL | 0 refills | Status: DC

## 2021-02-10 NOTE — Progress Notes (Signed)
Patient is here in-person for PV. Patient denies any allergies to eggs or soy. Patient denies any problems with anesthesia/sedation. Patient denies any oxygen use at home. Patient denies taking any diet/weight loss medications or blood thinners. Patient is not being treated for MRSA or C-diff. Patient is aware of our care-partner policy and Covid-19 safety protocol. EMMI education assigned to the patient for the procedure, sent to MyChart.   Patient is fully COVID-19 vaccinated, per patient.   Prep Prescription coupon given to the patient. 

## 2021-02-13 DIAGNOSIS — L814 Other melanin hyperpigmentation: Secondary | ICD-10-CM | POA: Diagnosis not present

## 2021-02-13 DIAGNOSIS — L819 Disorder of pigmentation, unspecified: Secondary | ICD-10-CM | POA: Diagnosis not present

## 2021-02-13 DIAGNOSIS — B354 Tinea corporis: Secondary | ICD-10-CM | POA: Diagnosis not present

## 2021-02-13 DIAGNOSIS — D229 Melanocytic nevi, unspecified: Secondary | ICD-10-CM | POA: Diagnosis not present

## 2021-02-13 DIAGNOSIS — B351 Tinea unguium: Secondary | ICD-10-CM | POA: Diagnosis not present

## 2021-02-13 DIAGNOSIS — L821 Other seborrheic keratosis: Secondary | ICD-10-CM | POA: Diagnosis not present

## 2021-02-13 DIAGNOSIS — L570 Actinic keratosis: Secondary | ICD-10-CM | POA: Diagnosis not present

## 2021-02-17 DIAGNOSIS — R200 Anesthesia of skin: Secondary | ICD-10-CM | POA: Diagnosis not present

## 2021-02-17 DIAGNOSIS — G5603 Carpal tunnel syndrome, bilateral upper limbs: Secondary | ICD-10-CM | POA: Diagnosis not present

## 2021-02-17 DIAGNOSIS — G5602 Carpal tunnel syndrome, left upper limb: Secondary | ICD-10-CM | POA: Diagnosis not present

## 2021-02-17 DIAGNOSIS — G5601 Carpal tunnel syndrome, right upper limb: Secondary | ICD-10-CM | POA: Diagnosis not present

## 2021-02-19 DIAGNOSIS — M25552 Pain in left hip: Secondary | ICD-10-CM | POA: Diagnosis not present

## 2021-02-24 ENCOUNTER — Ambulatory Visit (AMBULATORY_SURGERY_CENTER): Payer: PPO | Admitting: Internal Medicine

## 2021-02-24 ENCOUNTER — Other Ambulatory Visit: Payer: Self-pay

## 2021-02-24 ENCOUNTER — Other Ambulatory Visit: Payer: Self-pay | Admitting: Internal Medicine

## 2021-02-24 ENCOUNTER — Encounter: Payer: Self-pay | Admitting: Internal Medicine

## 2021-02-24 VITALS — BP 123/58 | HR 53 | Temp 98.6°F | Resp 14 | Ht 69.0 in | Wt 251.0 lb

## 2021-02-24 DIAGNOSIS — D123 Benign neoplasm of transverse colon: Secondary | ICD-10-CM | POA: Diagnosis not present

## 2021-02-24 DIAGNOSIS — Z1211 Encounter for screening for malignant neoplasm of colon: Secondary | ICD-10-CM

## 2021-02-24 HISTORY — PX: COLONOSCOPY WITH PROPOFOL: SHX5780

## 2021-02-24 HISTORY — PX: COLONOSCOPY: SHX174

## 2021-02-24 MED ORDER — SODIUM CHLORIDE 0.9 % IV SOLN: 500.0000 mL | INTRAVENOUS | Status: DC

## 2021-02-24 NOTE — Progress Notes (Signed)
Called to room to assist during endoscopic procedure.  Patient ID and intended procedure confirmed with present staff. Received instructions for my participation in the procedure from the performing physician.  

## 2021-02-24 NOTE — Patient Instructions (Signed)
Please read handouts provided. Continue present medications. Await pathology results.   YOU HAD AN ENDOSCOPIC PROCEDURE TODAY AT THE [facility]:   Refer to the procedure report that was given to you for any specific questions about what was found during the examination.  If the procedure report does not answer your questions, please call your gastroenterologist to clarify.  If you requested that your care partner not be given the details of your procedure findings, then the procedure report has been included in a sealed envelope for you to review at your convenience later.  YOU SHOULD EXPECT: Some feelings of bloating in the abdomen. Passage of more gas than usual.  Walking can help get rid of the air that was put into your GI tract during the procedure and reduce the bloating. If you had a lower endoscopy (such as a colonoscopy or flexible sigmoidoscopy) you may notice spotting of blood in your stool or on the toilet paper. If you underwent a bowel prep for your procedure, you may not have a normal bowel movement for a few days.  Please Note:  You might notice some irritation and congestion in your nose or some drainage.  This is from the oxygen used during your procedure.  There is no need for concern and it should clear up in a day or so.  SYMPTOMS TO REPORT IMMEDIATELY:  Following lower endoscopy (colonoscopy or flexible sigmoidoscopy):  Excessive amounts of blood in the stool  Significant tenderness or worsening of abdominal pains  Swelling of the abdomen that is new, acute  Fever of 100F or higher   For urgent or emergent issues, a gastroenterologist can be reached at any hour by calling [phone]. Do not use MyChart messaging for urgent concerns.    DIET:  We do recommend a small meal at first, but then you may proceed to your regular diet.  Drink plenty of fluids but you should avoid alcoholic beverages for 24 hours.  ACTIVITY:  You should plan to take it easy  for the rest of today and you should NOT DRIVE or use heavy machinery until tomorrow (because of the sedation medicines used during the test).    FOLLOW UP: Our staff will call the number listed on your records 48-72 hours following your procedure to check on you and address any questions or concerns that you may have regarding the information given to you following your procedure. If we do not reach you, we will leave a message.  We will attempt to reach you two times.  During this call, we will ask if you have developed any symptoms of COVID 19. If you develop any symptoms (ie: fever, flu-like symptoms, shortness of breath, cough etc.) before then, please call [phone].  If you test positive for Covid 19 in the 2 weeks post procedure, please call and report this information to us.    If any biopsies were taken you will be contacted by phone or by letter within the next 1-3 weeks.  Please call us at [phone] if you have not heard about the biopsies in 3 weeks.    SIGNATURES/CONFIDENTIALITY: You and/or your care partner have signed paperwork which will be entered into your electronic medical record.  These signatures attest to the fact that that the information above on your After Visit Summary has been reviewed and is understood.  Full responsibility of the confidentiality of this discharge information lies with you and/or your care-partner.  

## 2021-02-24 NOTE — Op Note (Signed)
[place] Patient Name: Luke Skinner Procedure Date: 02/24/2021 8:02 AM MRN: [id number] Endoscopist: Docia Chuck. Henrene Pastor , MD Age: 65 Referring MD:  Date of Birth: [birth year] Gender: Male Account #: [id number] Procedure:                Colonoscopy with cold snare polypectomy x 1 Indications:              Screening for colorectal malignant neoplasm.                            Negative for neoplasia index examination February                            2012 Medicines:                Monitored Anesthesia Care Procedure:                Pre-Anesthesia Assessment:                           - Prior to the procedure, a History and Physical                            was performed, and patient medications and                            allergies were reviewed. The patient's tolerance of                            previous anesthesia was also reviewed. The risks                            and benefits of the procedure and the sedation                            options and risks were discussed with the patient.                            All questions were answered, and informed consent                            was obtained. Prior Anticoagulants: The patient has                            taken no previous anticoagulant or antiplatelet                            agents. After reviewing the risks and benefits, the                            patient was deemed in satisfactory condition to                            undergo the procedure.  After obtaining informed consent, the colonoscope                            was passed under direct vision. Throughout the                            procedure, the patient's blood pressure, pulse, and                            oxygen saturations were monitored continuously. The                            Olympus CF-HQ190 [id number]) Colonoscope was                            introduced through the anus and advanced to the the                             cecum, identified by appendiceal orifice and                            ileocecal valve. The ileocecal valve, appendiceal                            orifice, and rectum were photographed. The quality                            of the bowel preparation was excellent. The                            colonoscopy was performed without difficulty. The                            patient tolerated the procedure well. The bowel                            preparation used was SUPREP/tablets via split dose                            instruction. Scope In: 8:13:00 AM Scope Out: 8:26:58 AM Scope Withdrawal Time: 0 hours 10 minutes 4 seconds  Total Procedure Duration: 0 hours 13 minutes 58 seconds  Findings:                 A 5 mm polyp was found in the transverse colon. The                            polyp was removed with a cold snare. Resection and                            retrieval were complete.                           Multiple diverticula were found in the left colon.  Internal hemorrhoids along with prominent rectal                            veins were found during retroflexion.                           The exam was otherwise without abnormality on                            direct and retroflexion views. Complications:            No immediate complications. Estimated blood loss:                            None. Estimated Blood Loss:     Estimated blood loss: none. Impression:               - One 5 mm polyp in the transverse colon, removed                            with a cold snare. Resected and retrieved.                           - Diverticulosis in the left colon.                           - Internal hemorrhoids.                           - The examination was otherwise normal on direct                            and retroflexion views. Recommendation:           - Repeat colonoscopy in 7 years if polyp                             adenomatous, otherwise 10 years for surveillance.                           - Patient has a contact number available for                            emergencies. The signs and symptoms of potential                            delayed complications were discussed with the                            patient. Return to normal activities tomorrow.                            Written discharge instructions were provided to the                            patient.                           -  Resume previous diet.                           - Continue present medications.                           - Await pathology results. Docia Chuck. Henrene Pastor, MD 02/24/2021 8:37:25 AM This report has been signed electronically.

## 2021-02-24 NOTE — Progress Notes (Signed)
PT taken to PACU. Monitors in place. VSS. Report given to RN. 

## 2021-02-26 ENCOUNTER — Telehealth: Payer: Self-pay

## 2021-02-26 DIAGNOSIS — G5623 Lesion of ulnar nerve, bilateral upper limbs: Secondary | ICD-10-CM | POA: Diagnosis not present

## 2021-02-26 NOTE — Telephone Encounter (Signed)
  Follow up Call-  Call back number 02/24/2021  Post procedure Call Back phone  # [phone]  Permission to leave phone message Yes  Some recent data might be hidden     Patient questions:  Do you have a fever, pain , or abdominal swelling? No. Pain Score  0 *  Have you tolerated food without any problems? Yes.    Have you been able to return to your normal activities? Yes.    Do you have any questions about your discharge instructions: Diet   No. Medications  No. Follow up visit  No.  Do you have questions or concerns about your Care? No.  Actions: * If pain score is 4 or above: No action needed, pain <4.  1. Have you developed a fever since your procedure? no  2.   Have you had an respiratory symptoms (SOB or cough) since your procedure? no  3.   Have you tested positive for COVID 19 since your procedure no  4.   Have you had any family members/close contacts diagnosed with the COVID 19 since your procedure?  no   If yes to any of these questions please route to Joylene John, RN and Joella Prince, RN

## 2021-03-03 DIAGNOSIS — G5601 Carpal tunnel syndrome, right upper limb: Secondary | ICD-10-CM | POA: Diagnosis not present

## 2021-03-03 DIAGNOSIS — G5623 Lesion of ulnar nerve, bilateral upper limbs: Secondary | ICD-10-CM | POA: Diagnosis not present

## 2021-03-03 DIAGNOSIS — G5603 Carpal tunnel syndrome, bilateral upper limbs: Secondary | ICD-10-CM | POA: Diagnosis not present

## 2021-03-03 DIAGNOSIS — G5602 Carpal tunnel syndrome, left upper limb: Secondary | ICD-10-CM | POA: Diagnosis not present

## 2021-03-07 ENCOUNTER — Telehealth: Payer: Self-pay | Admitting: Internal Medicine

## 2021-03-07 NOTE — Telephone Encounter (Signed)
Inbound call from patient about lab results. Best contact number [phone]

## 2021-03-07 NOTE — Telephone Encounter (Signed)
Called patient back and gave Colonoscopy Path results. Let him know Dr. Henrene Pastor would be reviewing the results and giving his recommendations.

## 2021-03-17 ENCOUNTER — Encounter: Payer: Self-pay | Admitting: Internal Medicine

## 2021-04-10 ENCOUNTER — Other Ambulatory Visit: Payer: Self-pay

## 2021-04-10 ENCOUNTER — Other Ambulatory Visit (INDEPENDENT_AMBULATORY_CARE_PROVIDER_SITE_OTHER): Payer: PPO

## 2021-04-10 DIAGNOSIS — E039 Hypothyroidism, unspecified: Secondary | ICD-10-CM | POA: Diagnosis not present

## 2021-04-10 LAB — TSH: TSH: 0.09 u[IU]/mL — ABNORMAL LOW (ref 0.35–4.50)

## 2021-04-11 ENCOUNTER — Encounter: Payer: Self-pay | Admitting: Family Medicine

## 2021-04-11 ENCOUNTER — Telehealth: Payer: Self-pay | Admitting: Family Medicine

## 2021-04-11 DIAGNOSIS — E039 Hypothyroidism, unspecified: Secondary | ICD-10-CM

## 2021-04-11 NOTE — Telephone Encounter (Signed)
Pt is calling in stating that he has rec'd his lab results and is concerned about them he has googled it and he has all the side effects that was mentioned in the research.  Pt is aware that someone from the office will call him with the results.

## 2021-04-14 ENCOUNTER — Other Ambulatory Visit: Payer: Self-pay

## 2021-04-14 MED ORDER — LEVOTHYROXINE SODIUM 75 MCG PO TABS: 75.0000 ug | ORAL_TABLET | Freq: Every day | ORAL | 0 refills | Status: DC

## 2021-04-14 NOTE — Telephone Encounter (Signed)
I understand his concerns. We will decrease the Levothyroxine dose to 75 mcg daily. Call in #[phone] with no rf. In 60 days we will check another free T3, free T4, and TSH

## 2021-04-14 NOTE — Telephone Encounter (Signed)
Please see Mychart message.

## 2021-04-15 ENCOUNTER — Encounter: Payer: Self-pay | Admitting: Family Medicine

## 2021-04-15 ENCOUNTER — Telehealth: Payer: Self-pay

## 2021-04-15 ENCOUNTER — Ambulatory Visit (INDEPENDENT_AMBULATORY_CARE_PROVIDER_SITE_OTHER): Payer: PPO | Admitting: Family Medicine

## 2021-04-15 VITALS — BP 122/78 | HR 64 | Temp 97.9°F | Wt 250.0 lb

## 2021-04-15 DIAGNOSIS — E039 Hypothyroidism, unspecified: Secondary | ICD-10-CM

## 2021-04-15 MED ORDER — SYNTHROID 75 MCG PO TABS: 75.0000 ug | ORAL_TABLET | Freq: Every day | ORAL | 3 refills | Status: DC

## 2021-04-15 NOTE — Telephone Encounter (Signed)
Pt thyroid generic medication was canceled with [facility], Pharmacy aware to fill the Brand name sent today per Dr Sarajane Jews

## 2021-04-15 NOTE — Progress Notes (Signed)
   Subjective:    Patient ID: Luke Skinner, male    DOB: 04-29-1956, 65 y.o.   MRN: [id number]  HPI Here to discuss his thyroid medication. He has complained of symptoms like irritability and excessive sweating which sound like he has been over medicated. We had decided to lower his dose to 75 mcg daily. He agrees with this but he also asks to go back to name brand Synthroid. He was switched to generic in 2019 , and he says he has not felt right ever since. His weight is stable. His recent TSH on 04-10-21 was 0.09.    Review of Systems  Constitutional: Negative.   Respiratory: Negative.    Cardiovascular: Negative.   Endocrine: Positive for heat intolerance.      Objective:   Physical Exam Constitutional:      Appearance: Normal appearance.  Cardiovascular:     Rate and Rhythm: Normal rate and regular rhythm.     Pulses: Normal pulses.     Heart sounds: Normal heart sounds.  Pulmonary:     Effort: Pulmonary effort is normal.     Breath sounds: Normal breath sounds.  Neurological:     Mental Status: He is alert.          Assessment & Plan:  Hypothyroidism. We agreed to go back to name brand only Synthroid and he will take 75 mcg daily. Recheck labs in 60 days.  Alysia Penna, MD

## 2021-04-16 NOTE — Telephone Encounter (Signed)
Pt had an office visit with Dr Sarajane Jews regarding this problem

## 2021-04-28 DIAGNOSIS — L819 Disorder of pigmentation, unspecified: Secondary | ICD-10-CM | POA: Diagnosis not present

## 2021-04-28 DIAGNOSIS — L570 Actinic keratosis: Secondary | ICD-10-CM | POA: Diagnosis not present

## 2021-04-28 DIAGNOSIS — B351 Tinea unguium: Secondary | ICD-10-CM | POA: Diagnosis not present

## 2021-04-28 DIAGNOSIS — B354 Tinea corporis: Secondary | ICD-10-CM | POA: Diagnosis not present

## 2021-04-30 DIAGNOSIS — M25552 Pain in left hip: Secondary | ICD-10-CM | POA: Diagnosis not present

## 2021-05-14 DIAGNOSIS — M25552 Pain in left hip: Secondary | ICD-10-CM | POA: Diagnosis not present

## 2021-06-16 DIAGNOSIS — M7552 Bursitis of left shoulder: Secondary | ICD-10-CM | POA: Diagnosis not present

## 2021-06-16 DIAGNOSIS — M7542 Impingement syndrome of left shoulder: Secondary | ICD-10-CM | POA: Diagnosis not present

## 2021-06-17 ENCOUNTER — Other Ambulatory Visit: Payer: Self-pay

## 2021-06-17 ENCOUNTER — Other Ambulatory Visit (INDEPENDENT_AMBULATORY_CARE_PROVIDER_SITE_OTHER): Payer: PPO

## 2021-06-17 DIAGNOSIS — E039 Hypothyroidism, unspecified: Secondary | ICD-10-CM

## 2021-06-17 LAB — T3, FREE: T3, Free: 2.7 pg/mL (ref 2.3–4.2)

## 2021-06-17 LAB — TSH: TSH: 0.46 u[IU]/mL (ref 0.35–5.50)

## 2021-06-17 LAB — T4, FREE: Free T4: 0.87 ng/dL (ref 0.60–1.60)

## 2021-06-20 ENCOUNTER — Telehealth: Payer: Self-pay

## 2021-06-20 NOTE — Telephone Encounter (Signed)
Spoke with pt verbalized understanding that Dr Sarajane Jews advise him to stay on the same dose of synthroid

## 2021-06-20 NOTE — Telephone Encounter (Signed)
Patient called and informed of results verbalized understanding pt would like to know if the medication frequency will change.

## 2021-06-20 NOTE — Telephone Encounter (Signed)
No changes, stay in the Synthroid every day

## 2021-06-20 NOTE — Telephone Encounter (Signed)
Please advise 

## 2021-06-26 DIAGNOSIS — M25552 Pain in left hip: Secondary | ICD-10-CM | POA: Diagnosis not present

## 2021-07-28 ENCOUNTER — Telehealth: Payer: Self-pay | Admitting: Family Medicine

## 2021-07-28 ENCOUNTER — Other Ambulatory Visit: Payer: Self-pay

## 2021-07-28 ENCOUNTER — Ambulatory Visit (INDEPENDENT_AMBULATORY_CARE_PROVIDER_SITE_OTHER): Payer: PPO

## 2021-07-28 DIAGNOSIS — Z23 Encounter for immunization: Secondary | ICD-10-CM | POA: Diagnosis not present

## 2021-07-28 MED ORDER — HYDROCODONE-ACETAMINOPHEN 5-325 MG PO TABS: 1.0000 | ORAL_TABLET | ORAL | 0 refills | Status: AC | PRN

## 2021-07-28 NOTE — Telephone Encounter (Signed)
Patient stated that he may need a refill for HYDROcodone-acetaminophen (NORCO) 10-325 MG tablet [[id number]]  ENDED before his next appointment with Dr.Fry in March.  Patient could be contacted at [phone].  Please advise.

## 2021-07-28 NOTE — Telephone Encounter (Signed)
Requested refill currently not listed on medication list, has been discontinued. Please advise Last office visit- 04/15/21

## 2021-07-28 NOTE — Telephone Encounter (Signed)
Sent pt a MyChart message regarding refill

## 2021-07-28 NOTE — Telephone Encounter (Signed)
I sent in #[phone]  

## 2021-08-25 DIAGNOSIS — M7551 Bursitis of right shoulder: Secondary | ICD-10-CM | POA: Diagnosis not present

## 2021-08-25 DIAGNOSIS — M19011 Primary osteoarthritis, right shoulder: Secondary | ICD-10-CM | POA: Diagnosis not present

## 2021-08-25 DIAGNOSIS — M7581 Other shoulder lesions, right shoulder: Secondary | ICD-10-CM | POA: Diagnosis not present

## 2021-09-03 DIAGNOSIS — M25552 Pain in left hip: Secondary | ICD-10-CM | POA: Diagnosis not present

## 2021-11-10 ENCOUNTER — Encounter: Payer: Self-pay | Admitting: Family Medicine

## 2021-11-10 ENCOUNTER — Telehealth (INDEPENDENT_AMBULATORY_CARE_PROVIDER_SITE_OTHER): Payer: PPO | Admitting: Family Medicine

## 2021-11-10 ENCOUNTER — Telehealth: Payer: Self-pay | Admitting: Family Medicine

## 2021-11-10 DIAGNOSIS — U071 COVID-19: Secondary | ICD-10-CM | POA: Insufficient documentation

## 2021-11-10 DIAGNOSIS — J019 Acute sinusitis, unspecified: Secondary | ICD-10-CM

## 2021-11-10 MED ORDER — CEFDINIR 300 MG PO CAPS: 300.0000 mg | ORAL_CAPSULE | Freq: Two times a day (BID) | ORAL | 0 refills | Status: DC

## 2021-11-10 NOTE — Telephone Encounter (Signed)
Patient calling in with respiratory symptoms: Shortness of breath, chest pain, palpitations or other red words send to Triage  Does the patient have a fever over 100, cough, congestion, sore throat, runny nose, lost of taste/smell (please list symptoms that patient has)?throat congestion, sinus drainage  What date did symptoms start?11-03-2021 (If over 5 days ago, pt may be scheduled for in person visit)  Have you tested for Covid in the last 5 days? Yes   If yes, was it positive [x]  OR negative [] ? If positive in the last 5 days, please schedule virtual visit now. If negative, schedule for an in person OV with the next available provider if PCP has no openings. Please also let patient know they will be tested again (follow the script below)  "you will have to arrive 75mins prior to your appt time to be Covid tested. Please park in back of office at the [facility] & call [phone] to let the staff know you have arrived. A staff member will meet you at your car to do a rapid covid test. Once the test has resulted you will be notified by phone of your results to determine if appt will remain an in person visit or be converted to a virtual/phone visit. If you arrive less than 47mins before your appt time, your visit will be automatically converted to virtual & any recommended testing will happen AFTER the visit." Pt was on cruise ship and had covid pt has an virtual appt with dr fry on 11-10-2021 330 pm THINGS TO REMEMBER  If no availability for virtual visit in office,  please schedule another [place] office  If no availability at another [place] office, please instruct patient that they can schedule an evisit or virtual visit through their mychart account. Visits up to 8pm  patients can be seen in office 5 days after positive COVID test

## 2021-11-10 NOTE — Progress Notes (Signed)
Subjective:    Patient ID: Luke Skinner, male    DOB: [birth year], 66 y.o.   MRN: [id number]  HPI Virtual Visit via Video Note  I connected with the patient on 11/10/21 at  3:30 PM EST by a video enabled telemedicine application and verified that I am speaking with the correct person using two identifiers.  Location patient: home Location provider:work or home office Persons participating in the virtual visit: patient, provider  I discussed the limitations of evaluation and management by telemedicine and the availability of in person appointments. The patient expressed understanding and agreed to proceed.   HPI: Here for a possible sinus infection. 10 days ago while he was on a cruise he developed stuffy head, PND, and a dry cough. No fever or SOB. He tested positive for the Covid-19 virus and he was treated with 5 days of Paxlovid. Now the cough is better but he still has a stuffy head and is blowing yellow mucus from the nose. Taking Guaifenesin BID.    ROS: See pertinent positives and negatives per HPI.  Past Medical History:  Diagnosis Date   Allergy    Arthritis    oa   Herpes stomatitis    fever blisters occasional   History of kidney stones    Hyperlipidemia    Hypertension    Hypothyroidism    Pneumonia 2006 last time   several times    Rash    below both knees for many years uses selsum blue to wash with    Past Surgical History:  Procedure Laterality Date   COLONOSCOPY  12-09-10   per Dr. Henrene Pastor, repeat in 10 yrs   ESOPHAGUS SURGERY     as infant    [place]   per Dr. Annamaria Boots   HERNIA REPAIR     left inguinal    KNEE ARTHROSCOPY Left 1973, [place] and 2003   NASAL SEPTUM SURGERY     SHOULDER ARTHROSCOPY  2005   on right per Dr. 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 Wynelle Link    Family History  Problem Relation Age of Onset   Heart disease Mother    Thyroid disease Mother    Parkinsonism Father    Thyroid disease Sister    Diabetes Maternal Uncle    Colon cancer Neg Hx    Colon polyps Neg Hx    Esophageal cancer Neg Hx    Rectal cancer Neg Hx    Stomach cancer Neg Hx      Current Outpatient Medications:    acetaminophen (TYLENOL) 650 MG CR tablet, Take 650 mg by mouth every 8 (eight) hours as needed for pain., Disp: , Rfl:    Ascorbic Acid (VITAMIN C) 1000 MG tablet, Vitamin C, Disp: , Rfl:    B Complex Vitamins (VITAMIN B COMPLEX PO), vitamin B complex, Disp: , Rfl:    cefdinir (OMNICEF) 300 MG capsule, Take 1 capsule (300 mg total) by mouth 2 (two) times daily., Disp: 20 capsule, Rfl: 0   diphenhydrAMINE (BENADRYL) 25 MG tablet, Take 25 mg by mouth at bedtime as needed for sleep. For sleep., Disp: , Rfl:    docusate sodium (COLACE) 100 MG capsule, Take 100 mg by mouth 2 (two) times daily., Disp: , Rfl:  ezetimibe (ZETIA) 10 MG tablet, Take 1 tablet (10 mg total) by mouth daily., Disp: 90 tablet, Rfl: 3   griseofulvin (GRIFULVIN V) 500 MG tablet, Take 500 mg by mouth 2 (two) times daily., Disp: , Rfl:    hydrochlorothiazide (HYDRODIURIL) 25 MG tablet, Take 1 tablet (25 mg total) by mouth daily., Disp: 90 tablet, Rfl: 3   ketoconazole (NIZORAL) 2 % cream, Apply 1 application topically 2 (two) times daily., Disp: 30 g, Rfl: 5   meloxicam (MOBIC) 15 MG tablet, Take 1 tablet (15 mg total) by mouth daily as needed., Disp: 90 tablet, Rfl: 3   methocarbamol (ROBAXIN) 500 MG tablet, Take 1 tablet (500 mg total) by mouth every 6 (six) hours as needed for muscle spasms., Disp: 180 tablet, Rfl: 3   metoprolol succinate (TOPROL-XL) 50 MG 24 hr tablet, Take 1 tablet (50 mg total) by mouth daily. Take with or immediately following a meal., Disp: 90 tablet, Rfl: 3   mometasone (ELOCON) 0.1 % cream, Apply topically as  needed., Disp: 45 g, Rfl: 11   Multiple Vitamins-Minerals (MENS MULTIVITAMIN PO), Take by mouth., Disp: , Rfl:    POTASSIUM CHLORIDE PO, Take 99 mg by mouth., Disp: , Rfl:    sildenafil (VIAGRA) 100 MG tablet, Take 1 tablet (100 mg total) by mouth daily as needed for erectile dysfunction., Disp: 30 tablet, Rfl: 5   SYNTHROID 75 MCG tablet, Take 1 tablet (75 mcg total) by mouth daily before breakfast., Disp: 90 tablet, Rfl: 3   telmisartan (MICARDIS) 80 MG tablet, Take 1 tablet (80 mg total) by mouth daily., Disp: 90 tablet, Rfl: 3   valACYclovir (VALTREX) 1000 MG tablet, Take 1 tablet (1,000 mg total) by mouth 2 (two) times daily as needed (fever bloisters)., Disp: 180 tablet, Rfl: 3  EXAM:  VITALS per patient if applicable:  GENERAL: alert, oriented, appears well and in no acute distress  HEENT: atraumatic, conjunttiva clear, no obvious abnormalities on inspection of external nose and ears  NECK: normal movements of the head and neck  LUNGS: on inspection no signs of respiratory distress, breathing rate appears normal, no obvious gross SOB, gasping or wheezing  CV: no obvious cyanosis  MS: moves all visible extremities without noticeable abnormality  PSYCH/NEURO: pleasant and cooperative, no obvious depression or anxiety, speech and thought processing grossly intact  ASSESSMENT AND PLAN: He is recovering from a Covid infection and he has developed a secondary sinus infection. We will treat this with 10 days of Cefdinir. Recheck as needed.  Alysia Penna, MD  Discussed the following assessment and plan:  No diagnosis found.     I discussed the assessment and treatment plan with the patient. The patient was provided an opportunity to ask questions and all were answered. The patient agreed with the plan and demonstrated an understanding of the instructions.   The patient was advised to call back or seek an in-person evaluation if the symptoms worsen or if the condition fails to  improve as anticipated.      Review of Systems     Objective:   Physical Exam        Assessment & Plan:

## 2021-11-12 DIAGNOSIS — L570 Actinic keratosis: Secondary | ICD-10-CM | POA: Diagnosis not present

## 2021-11-12 DIAGNOSIS — D225 Melanocytic nevi of trunk: Secondary | ICD-10-CM | POA: Diagnosis not present

## 2021-11-12 DIAGNOSIS — L821 Other seborrheic keratosis: Secondary | ICD-10-CM | POA: Diagnosis not present

## 2021-11-12 DIAGNOSIS — B353 Tinea pedis: Secondary | ICD-10-CM | POA: Diagnosis not present

## 2021-11-12 DIAGNOSIS — L578 Other skin changes due to chronic exposure to nonionizing radiation: Secondary | ICD-10-CM | POA: Diagnosis not present

## 2021-11-12 DIAGNOSIS — L814 Other melanin hyperpigmentation: Secondary | ICD-10-CM | POA: Diagnosis not present

## 2021-11-12 DIAGNOSIS — L819 Disorder of pigmentation, unspecified: Secondary | ICD-10-CM | POA: Diagnosis not present

## 2021-11-13 ENCOUNTER — Telehealth: Payer: Self-pay | Admitting: Family Medicine

## 2021-11-13 NOTE — Telephone Encounter (Signed)
Pt call and stated the antibiotic is not working and want to know if dr.Fry will call in the Z-Pack for him.

## 2021-11-13 NOTE — Telephone Encounter (Signed)
Patient spouse is calling to see if someone else could take care of this because this patient needs this medication today.  Please route to a provider in office if this could be done today.  Patient spouse is requesting to be contacted at [phone].  Patient is requesting for this to be sent to the [facility] on [address] if possible.  Please advise.

## 2021-11-13 NOTE — Telephone Encounter (Signed)
This is Dr. Sarajane Jews patient.   Can you please advise this message.  Pharmacy updated

## 2021-11-13 NOTE — Telephone Encounter (Signed)
Patient prescribed Cefdinir 300mg  capsules, 1 capsule BID sent to pharmacy on 11/10/21.     Please advise

## 2021-11-14 ENCOUNTER — Other Ambulatory Visit: Payer: Self-pay

## 2021-11-14 MED ORDER — CEFDINIR 300 MG PO CAPS: 300.0000 mg | ORAL_CAPSULE | Freq: Two times a day (BID) | ORAL | 0 refills | Status: DC

## 2021-11-14 NOTE — Telephone Encounter (Signed)
Spoke with patient, and wife Olin Hauser message given.

## 2021-11-14 NOTE — Telephone Encounter (Signed)
I agree with [name]. Stay on the Cefdinir and if he is not better by next week, let us know

## 2022-01-01 DIAGNOSIS — H2513 Age-related nuclear cataract, bilateral: Secondary | ICD-10-CM | POA: Diagnosis not present

## 2022-01-06 ENCOUNTER — Other Ambulatory Visit: Payer: Self-pay | Admitting: Family Medicine

## 2022-01-12 DIAGNOSIS — N200 Calculus of kidney: Secondary | ICD-10-CM | POA: Diagnosis not present

## 2022-01-14 ENCOUNTER — Ambulatory Visit (INDEPENDENT_AMBULATORY_CARE_PROVIDER_SITE_OTHER): Payer: PPO | Admitting: Family Medicine

## 2022-01-14 ENCOUNTER — Telehealth: Payer: Self-pay | Admitting: Family Medicine

## 2022-01-14 ENCOUNTER — Encounter: Payer: Self-pay | Admitting: Family Medicine

## 2022-01-14 VITALS — BP 128/76 | HR 60 | Temp 99.4°F | Ht 69.0 in | Wt 186.0 lb

## 2022-01-14 DIAGNOSIS — E230 Hypopituitarism: Secondary | ICD-10-CM | POA: Diagnosis not present

## 2022-01-14 DIAGNOSIS — E039 Hypothyroidism, unspecified: Secondary | ICD-10-CM

## 2022-01-14 DIAGNOSIS — G629 Polyneuropathy, unspecified: Secondary | ICD-10-CM | POA: Diagnosis not present

## 2022-01-14 DIAGNOSIS — Z0000 Encounter for general adult medical examination without abnormal findings: Secondary | ICD-10-CM | POA: Diagnosis not present

## 2022-01-14 LAB — LIPID PANEL
Cholesterol: 177 mg/dL (ref 0–200)
HDL: 44.4 mg/dL (ref >39.00)
NonHDL: 132.16
Total CHOL/HDL Ratio: 4
Triglycerides: 229 mg/dL — ABNORMAL HIGH (ref 0.0–149.0)
VLDL: 45.8 mg/dL — ABNORMAL HIGH (ref 0.0–40.0)

## 2022-01-14 LAB — CBC WITH DIFFERENTIAL/PLATELET
Basophils Absolute: 0 10*3/uL (ref 0.0–0.1)
Basophils Relative: 0.3 % (ref 0.0–3.0)
Eosinophils Absolute: 0.1 10*3/uL (ref 0.0–0.7)
Eosinophils Relative: 1.8 % (ref 0.0–5.0)
HCT: 41 % (ref 39.0–52.0)
Hemoglobin: 13.7 g/dL (ref 13.0–17.0)
Lymphocytes Relative: 25.3 % (ref 12.0–46.0)
Lymphs Abs: 2 10*3/uL (ref 0.7–4.0)
MCHC: 33.5 g/dL (ref 30.0–36.0)
MCV: 84.5 fl (ref 78.0–100.0)
Monocytes Absolute: 0.8 10*3/uL (ref 0.1–1.0)
Monocytes Relative: 10.2 % (ref 3.0–12.0)
Neutro Abs: 4.8 10*3/uL (ref 1.4–7.7)
Neutrophils Relative %: 62.4 % (ref 43.0–77.0)
Platelets: 272 10*3/uL (ref 150.0–400.0)
RBC: 4.85 Mil/uL (ref 4.22–5.81)
RDW: 13.2 % (ref 11.5–15.5)
WBC: 7.8 10*3/uL (ref 4.0–10.5)

## 2022-01-14 LAB — TSH: TSH: 0.01 u[IU]/mL — ABNORMAL LOW (ref 0.35–5.50)

## 2022-01-14 LAB — LDL CHOLESTEROL, DIRECT: Direct LDL: 112 mg/dL

## 2022-01-14 LAB — HEPATIC FUNCTION PANEL
ALT: 36 U/L (ref 0–53)
AST: 23 U/L (ref 0–37)
Albumin: 4.6 g/dL (ref 3.5–5.2)
Alkaline Phosphatase: 76 U/L (ref 39–117)
Bilirubin, Direct: 0.1 mg/dL (ref 0.0–0.3)
Total Bilirubin: 0.7 mg/dL (ref 0.2–1.2)
Total Protein: 7.3 g/dL (ref 6.0–8.3)

## 2022-01-14 LAB — VITAMIN B12: Vitamin B-12: 528 pg/mL (ref 211–911)

## 2022-01-14 LAB — TESTOSTERONE: Testosterone: 378.88 ng/dL (ref 300.00–890.00)

## 2022-01-14 LAB — BASIC METABOLIC PANEL
BUN: 17 mg/dL (ref 6–23)
CO2: 31 mEq/L (ref 19–32)
Calcium: 10.3 mg/dL (ref 8.4–10.5)
Chloride: 101 mEq/L (ref 96–112)
Creatinine, Ser: 0.95 mg/dL (ref 0.40–1.50)
GFR: 83.67 mL/min (ref >60.00)
Glucose, Bld: 114 mg/dL — ABNORMAL HIGH (ref 70–99)
Potassium: 4.3 mEq/L (ref 3.5–5.1)
Sodium: 140 mEq/L (ref 135–145)

## 2022-01-14 LAB — PSA: PSA: 1.98 ng/mL (ref 0.10–4.00)

## 2022-01-14 LAB — T4, FREE: Free T4: 1.42 ng/dL (ref 0.60–1.60)

## 2022-01-14 LAB — HEMOGLOBIN A1C: Hgb A1c MFr Bld: 6.3 % (ref 4.6–6.5)

## 2022-01-14 LAB — T3, FREE: T3, Free: 4.2 pg/mL (ref 2.3–4.2)

## 2022-01-14 MED ORDER — EZETIMIBE 10 MG PO TABS: 10.0000 mg | ORAL_TABLET | Freq: Every day | ORAL | 3 refills | Status: DC

## 2022-01-14 MED ORDER — MELOXICAM 15 MG PO TABS: 15.0000 mg | ORAL_TABLET | Freq: Every day | ORAL | 3 refills | Status: DC | PRN

## 2022-01-14 MED ORDER — HYDROCHLOROTHIAZIDE 25 MG PO TABS: 25.0000 mg | ORAL_TABLET | Freq: Every day | ORAL | 3 refills | Status: DC

## 2022-01-14 MED ORDER — TELMISARTAN 80 MG PO TABS: 80.0000 mg | ORAL_TABLET | Freq: Every day | ORAL | 3 refills | Status: DC

## 2022-01-14 MED ORDER — HYDROCODONE-ACETAMINOPHEN 10-325 MG PO TABS: 1.0000 | ORAL_TABLET | ORAL | 0 refills | Status: AC | PRN

## 2022-01-14 MED ORDER — METOPROLOL SUCCINATE ER 50 MG PO TB24: ORAL_TABLET | ORAL | 3 refills | Status: DC

## 2022-01-14 NOTE — Telephone Encounter (Signed)
Patient called because he would like a call back regarding his lab results. He is sespecially concerned about his thyroid levels. ? ? ?Good callback number is [phone] ? ? ? ?Please advise  ?

## 2022-01-14 NOTE — Progress Notes (Signed)
? ?Subjective:  ? ? Patient ID: Luke Skinner, male    DOB: 1956-10-10, 66 y.o.   MRN: [id number] ? ?HPI ?Here for a well exam. He has a few issues to discuss. First he has trouble sleeping and he has tried Benadryl with mixed results. Also he describes numbness and tingling in both feet over the past 6 months.  ? ? ?Review of Systems  ?Constitutional: Negative.   ?HENT: Negative.    ?Eyes: Negative.   ?Respiratory: Negative.    ?Cardiovascular: Negative.   ?Gastrointestinal: Negative.   ?Genitourinary: Negative.   ?Musculoskeletal: Negative.   ?Skin: Negative.   ?Neurological:  Positive for numbness.  ?Psychiatric/Behavioral:  Positive for sleep disturbance.   ? ?   ?Objective:  ? Physical Exam ?Constitutional:   ?   General: He is not in acute distress. ?   Appearance: Normal appearance. He is well-developed. He is not diaphoretic.  ?HENT:  ?   Head: Normocephalic and atraumatic.  ?   Right Ear: External ear normal.  ?   Left Ear: External ear normal.  ?   Nose: Nose normal.  ?   Mouth/Throat:  ?   Pharynx: No oropharyngeal exudate.  ?Eyes:  ?   General: No scleral icterus.    ?   Right eye: No discharge.     ?   Left eye: No discharge.  ?   Conjunctiva/sclera: Conjunctivae normal.  ?   Pupils: Pupils are equal, round, and reactive to light.  ?Neck:  ?   Thyroid: No thyromegaly.  ?   Vascular: No JVD.  ?   Trachea: No tracheal deviation.  ?Cardiovascular:  ?   Rate and Rhythm: Normal rate and regular rhythm.  ?   Heart sounds: Normal heart sounds. No murmur heard. ?  No friction rub. No gallop.  ?Pulmonary:  ?   Effort: Pulmonary effort is normal. No respiratory distress.  ?   Breath sounds: Normal breath sounds. No wheezing or rales.  ?Chest:  ?   Chest wall: No tenderness.  ?Abdominal:  ?   General: Bowel sounds are normal. There is no distension.  ?   Palpations: Abdomen is soft. There is no mass.  ?   Tenderness: There is no abdominal tenderness. There is no guarding or rebound.  ?Genitourinary: ?   Penis:  Normal. No tenderness.   ?   Testes: Normal.  ?   Prostate: Normal.  ?   Rectum: Normal. Guaiac result negative.  ?Musculoskeletal:     ?   General: No tenderness. Normal range of motion.  ?   Cervical back: Neck supple.  ?Lymphadenopathy:  ?   Cervical: No cervical adenopathy.  ?Skin: ?   General: Skin is warm and dry.  ?   Coloration: Skin is not pale.  ?   Findings: No erythema or rash.  ?Neurological:  ?   Mental Status: He is alert and oriented to person, place, and time.  ?   Cranial Nerves: No cranial nerve deficit.  ?   Motor: No abnormal muscle tone.  ?   Coordination: Coordination normal.  ?   Deep Tendon Reflexes: Reflexes are normal and symmetric. Reflexes normal.  ?Psychiatric:     ?   Behavior: Behavior normal.     ?   Thought Content: Thought content normal.     ?   Judgment: Judgment normal.  ? ? ? ? ? ?   ?Assessment & Plan:  ?Well exam. We discussed diet and exercise.  Get fasting labs. For sleep he will try 10-20 mg of Melatonin at bedtime. He has a neuropathy and we will add a B12 level to his labs today.  ?Alysia Penna, MD ? ? ?

## 2022-01-15 NOTE — Telephone Encounter (Signed)
Labs were drawn yesterday; no result note completed at this time. Pt notified that once PCP has had a chance to review the labs, a result note will be completed & we will call him back with Dr Barbie Banner recommendations. Pt verb understanding. ?

## 2022-01-20 NOTE — Telephone Encounter (Signed)
Spoke with patient's wife she stated that the patient is very concerned about his TSH lab results with it being a 0.1.    Patient thinks he is taking to much medication, be doesn't feel well at all.      Patient is requesting a referral to Endocrinologist Dr. Cruzita Lederer at [facility]  Endocrinology.     Please advise ?

## 2022-01-20 NOTE — Addendum Note (Signed)
Addended by: Alysia Penna A on: 01/20/2022 12:17 PM ? ? Modules accepted: Orders ? ?

## 2022-01-20 NOTE — Telephone Encounter (Signed)
Spoke with patient, message given about referral placed. ?

## 2022-01-20 NOTE — Telephone Encounter (Signed)
I did the referral to Dr. Cruzita Lederer  ?

## 2022-01-21 DIAGNOSIS — M25552 Pain in left hip: Secondary | ICD-10-CM | POA: Diagnosis not present

## 2022-01-26 DIAGNOSIS — M25511 Pain in right shoulder: Secondary | ICD-10-CM | POA: Diagnosis not present

## 2022-01-28 ENCOUNTER — Ambulatory Visit (INDEPENDENT_AMBULATORY_CARE_PROVIDER_SITE_OTHER): Payer: PPO | Admitting: Family Medicine

## 2022-01-28 ENCOUNTER — Encounter: Payer: Self-pay | Admitting: Family Medicine

## 2022-01-28 VITALS — BP 128/64 | HR 61 | Temp 98.8°F | Wt 239.0 lb

## 2022-01-28 DIAGNOSIS — E039 Hypothyroidism, unspecified: Secondary | ICD-10-CM | POA: Diagnosis not present

## 2022-01-28 DIAGNOSIS — G629 Polyneuropathy, unspecified: Secondary | ICD-10-CM

## 2022-01-28 MED ORDER — GABAPENTIN 100 MG PO CAPS: 100.0000 mg | ORAL_CAPSULE | Freq: Three times a day (TID) | ORAL | 3 refills | Status: DC

## 2022-01-28 MED ORDER — GABAPENTIN 100 MG PO CAPS: 100.0000 mg | ORAL_CAPSULE | Freq: Every day | ORAL | 3 refills | Status: DC

## 2022-01-28 NOTE — Addendum Note (Signed)
Addended by: Octavio Manns E on: 01/28/2022 08:53 AM ? ? Modules accepted: Orders ? ?

## 2022-01-28 NOTE — Progress Notes (Signed)
? ?  Subjective:  ? ? Patient ID: Luke Skinner, male    DOB: [birth year], 66 y.o.   MRN: [id number] ? ?HPI ?Here to follow up on hypothyroidism and to discuss tingling in his feet. His last lab draw on 01-14-22 showed a TSH of 0.01, free T4 of 1.42, and free T3 of 4.2. he was taking 75 mcg a day of Synthroid at the time. He was having symptoms that suggested too high a dose, including anxiety, insomnia, rapid heartbeat, and sweating. He took it upon himself to decrease the dose, so for the past 2 weeks he has actually been taking 1/2 a pill on 2 days out of 3. He now feels much better, and the symptoms above have resolved. In addition he has had tingling and burning in both feet for about a year, and lately this has been getting worse. He is not diabetic and his B12 levels have been normal.  ? ? ?Review of Systems  ?Constitutional: Negative.   ?Respiratory: Negative.    ?Cardiovascular: Negative.   ?Neurological:  Positive for numbness.  ? ?   ?Objective:  ? Physical Exam ?Constitutional:   ?   Appearance: Normal appearance.  ?Cardiovascular:  ?   Rate and Rhythm: Normal rate and regular rhythm.  ?   Pulses: Normal pulses.  ?   Heart sounds: Normal heart sounds.  ?Pulmonary:  ?   Effort: Pulmonary effort is normal.  ?   Breath sounds: Normal breath sounds.  ?Neurological:  ?   General: No focal deficit present.  ?   Mental Status: He is alert and oriented to person, place, and time.  ? ? ? ? ? ?   ?Assessment & Plan:  ?For the hypothryoidism, we agreed that he will continue to take the Synthroid as above for 4 more weeks, then we will check another lab panel. For the neuropathy, he will try Gabapentin 100 mg at bedtime.  ?Alysia Penna, MD ? ? ?

## 2022-01-28 NOTE — Addendum Note (Signed)
Addended by: Wyvonne Lenz on: 01/28/2022 08:47 AM ? ? Modules accepted: Orders ? ?

## 2022-02-23 ENCOUNTER — Ambulatory Visit (INDEPENDENT_AMBULATORY_CARE_PROVIDER_SITE_OTHER): Payer: PPO

## 2022-02-23 VITALS — Ht 69.0 in | Wt 239.0 lb

## 2022-02-23 DIAGNOSIS — Z0000 Encounter for general adult medical examination without abnormal findings: Secondary | ICD-10-CM | POA: Diagnosis not present

## 2022-02-23 NOTE — Progress Notes (Signed)
?I connected with Luke Skinner today by telephone and verified that I am speaking with the correct person using two identifiers. ?Location patient: home ?Location provider: work ?Persons participating in the virtual visit: Luke Skinner, Glenna Durand LPN. ?  ?I discussed the limitations, risks, security and privacy concerns of performing an evaluation and management service by telephone and the availability of in person appointments. I also discussed with the patient that there may be a patient responsible charge related to this service. The patient expressed understanding and verbally consented to this telephonic visit.  ?  ?Interactive audio and video telecommunications were attempted between this provider and patient, however failed, due to patient having technical difficulties OR patient did not have access to video capability.  We continued and completed visit with audio only. ? ?  ? ?Vital signs may be patient reported or missing. ? ?Subjective:  ? Luke Skinner is a 66 y.o. male who presents for an Initial Medicare Annual Wellness Visit. ? ?Review of Systems    ? ?Cardiac Risk Factors include: advanced age (>54mn, >>66women);hypertension;dyslipidemia;obesity (BMI >30kg/m2) ? ?   ?Objective:  ?  ?Today's Vitals  ? 02/23/22 09758 ?Weight: 239 lb (108.4 kg)  ?Height: '5\' 9"'$  (1.753 m)  ? ?Body mass index is 35.29 kg/m?. ? ? ?  02/23/2022  ?  8:17 AM 10/21/2016  ? 11:30 AM 10/21/2016  ?  6:35 AM 10/15/2016  ? 10:26 AM 04/03/2015  ?  1:34 PM 10/05/2014  ? 10:11 AM  ?Advanced Directives  ?Does Patient Have a Medical Advance Directive? Yes Yes Yes Yes Yes No  ?Type of AParamedicof AF[place]Living will [place] Heights[place] will [place] will Living will;Healthcare Power of [place]ates CityLiving will   ?Does patient want to make changes to medical advance directive?  No - Patient declined  No - Patient declined No - Patient  declined   ?Copy of [place] Chart? No - copy requested No - copy requested No - copy requested No - copy requested No - copy requested   ?Would patient like information on creating a medical advance directive?      No - patient declined information  ? ? ?Current Medications (verified) ?Outpatient Encounter Medications as of 02/23/2022  ?Medication Sig  ? acetaminophen (TYLENOL) 650 MG CR tablet Take 650 mg by mouth every 8 (eight) hours as needed for pain.  ? Ascorbic Acid (VITAMIN C) 1000 MG tablet Vitamin C  ? B Complex Vitamins (VITAMIN B COMPLEX PO) Vit B12  ? diphenhydrAMINE (BENADRYL) 25 MG tablet Take 25 mg by mouth at bedtime as needed for sleep. For sleep.  ? ezetimibe (ZETIA) 10 MG tablet Take 1 tablet (10 mg total) by mouth daily.  ? hydrochlorothiazide (HYDRODIURIL) 25 MG tablet Take 1 tablet (25 mg total) by mouth daily.  ? ketoconazole (NIZORAL) 2 % cream Apply 1 application topically 2 (two) times daily.  ? meloxicam (MOBIC) 15 MG tablet Take 1 tablet (15 mg total) by mouth daily as needed.  ? methocarbamol (ROBAXIN) 500 MG tablet Take 1 tablet (500 mg total) by mouth every 6 (six) hours as needed for muscle spasms.  ? metoprolol succinate (TOPROL-XL) 50 MG 24 hr tablet TAKE 1 TABLET BY MOUTH ONCE DAILY WITH  OR  IMMEDIATELY  FOLLOWING  A  MEAL.  ? mometasone (ELOCON) 0.1 % cream Apply topically as needed.  ? Multiple Vitamins-Minerals (MENS MULTIVITAMIN PO) Take by mouth.  ?  POTASSIUM CHLORIDE PO Take 99 mg by mouth. Taking Potassium Gluconate  ? sildenafil (VIAGRA) 100 MG tablet Take 1 tablet (100 mg total) by mouth daily as needed for erectile dysfunction.  ? SYNTHROID 75 MCG tablet Take 1 tablet (75 mcg total) by mouth daily before breakfast. (Patient taking differently: Take 75 mcg by mouth daily before breakfast. Taking 1/2 tab)  ? telmisartan (MICARDIS) 80 MG tablet Take 1 tablet (80 mg total) by mouth daily.  ? valACYclovir (VALTREX) 1000 MG tablet Take 1 tablet (1,000 mg  total) by mouth 2 (two) times daily as needed (fever bloisters).  ? vitamin B-12 (CYANOCOBALAMIN) 500 MCG tablet Take 500 mcg by mouth daily.  ? gabapentin (NEURONTIN) 100 MG capsule Take 1 capsule (100 mg total) by mouth at bedtime.  ? ?No facility-administered encounter medications on file as of 02/23/2022.  ? ? ?Allergies (verified) ?Simvastatin and Sulfamethoxazole  ? ?History: ?Past Medical History:  ?Diagnosis Date  ? Allergy   ? Arthritis   ? oa  ? Herpes stomatitis   ? fever blisters occasional  ? History of kidney stones   ? Hyperlipidemia   ? Hypertension   ? Hypothyroidism   ? Pneumonia 2006 last time  ? several times   ? Rash   ? below both knees for many years uses selsum blue to wash with  ? ?Past Surgical History:  ?Procedure Laterality Date  ? COLONOSCOPY  02/24/2021  ? per Dr. Henrene Pastor, adenomatous polyp, repeat in 7 yrs  ? ESOPHAGUS SURGERY    ? as infant   ? GASTROSTOMY  1989  ? per Dr. Annamaria Boots  ? HERNIA REPAIR    ? left inguinal   ? KNEE ARTHROSCOPY Left 1973, 1975, 1990 and 2003  ? NASAL SEPTUM SURGERY    ? SHOULDER ARTHROSCOPY  2005  ? on right per Dr. Wynelle Link  ? TOTAL HIP ARTHROPLASTY Right 10/21/2016  ? Procedure: RIGHT TOTAL HIP ARTHROPLASTY ANTERIOR APPROACH;  Surgeon: Gaynelle Arabian, MD;  Location: [facility];  Service: Orthopedics;  Laterality: Right;  ? TOTAL KNEE ARTHROPLASTY  2008  ? on left per Dr. Wynelle Link  ? ?Family History  ?Problem Relation Age of Onset  ? Heart disease Mother   ? Thyroid disease Mother   ? Parkinsonism Father   ? Thyroid disease Sister   ? Diabetes Maternal Uncle   ? Colon cancer Neg Hx   ? Colon polyps Neg Hx   ? Esophageal cancer Neg Hx   ? Rectal cancer Neg Hx   ? Stomach cancer Neg Hx   ? ?Social History  ? ?Socioeconomic History  ? Marital status: Married  ?  Spouse name: Not on file  ? Number of children: Not on file  ? Years of education: 57  ? Highest education level: Not on file  ?Occupational History  ? Not on file  ?Tobacco Use  ? Smoking status: Former  ?   Packs/day: 0.50  ?  Years: 28.00  ?  Pack years: 14.00  ?  Types: Cigarettes  ?  Quit date: 10/19/1998  ?  Years since quitting: 23.3  ? Smokeless tobacco: Never  ?Vaping Use  ? Vaping Use: Never used  ?Substance and Sexual Activity  ? Alcohol use: No  ?  Alcohol/week: 0.0 standard drinks  ? Drug use: No  ? Sexual activity: Yes  ?Other Topics Concern  ? Not on file  ?Social History Narrative  ? Epworth Sleepiness Scale = 10 (07/22/15)  ? ?Social Determinants of Health  ? ?  Financial Resource Strain: Low Risk   ? Difficulty of Paying Living Expenses: Not hard at all  ?Food Insecurity: No Food Insecurity  ? Worried About Charity fundraiser in the Last Year: Never true  ? Ran Out of Food in the Last Year: Never true  ?Transportation Needs: No Transportation Needs  ? Lack of Transportation (Medical): No  ? Lack of Transportation (Non-Medical): No  ?Physical Activity: Sufficiently Active  ? Days of Exercise per Week: 3 days  ? Minutes of Exercise per Session: 90 min  ?Stress: No Stress Concern Present  ? Feeling of Stress : Not at all  ?Social Connections: Not on file  ? ? ?Tobacco Counseling ?Counseling given: Not Answered ? ? ?Clinical Intake: ? ?Pre-visit preparation completed: Yes ? ?Pain : No/denies pain ? ?  ? ?Nutritional Status: BMI > 30  Obese ?Nutritional Risks: None ?Diabetes: No ? ?How often do you need to have someone help you when you read instructions, pamphlets, or other written materials from your doctor or pharmacy?: 1 - Never ?What is the last grade level you completed in school?: 12th grade ? ?Diabetic? no ? ?Interpreter Needed?: No ? ?Information entered by :: NAllen LPN ? ? ?Activities of Daily Living ? ?  02/23/2022  ?  8:18 AM 02/22/2022  ?  8:00 PM  ?In your present state of health, do you have any difficulty performing the following activities:  ?Hearing? 0 0  ?Vision? 0 0  ?Difficulty concentrating or making decisions? 0 0  ?Walking or climbing stairs? 0 0  ?Dressing or bathing? 0 0  ?Doing errands,  shopping? 0 0  ?Preparing Food and eating ? N N  ?Using the Toilet? N N  ?In the past six months, have you accidently leaked urine? N N  ?Do you have problems with loss of bowel control? N N  ?Managing your Me

## 2022-02-23 NOTE — Patient Instructions (Signed)
Luke Skinner , ?Thank you for taking time to come for your Medicare Wellness Visit. I appreciate your ongoing commitment to your health goals. Please review the following plan we discussed and let me know if I can assist you in the future.  ? ?Screening recommendations/referrals: ?Colonoscopy: completed 02/24/2021, due 02/25/2028 ?Recommended yearly ophthalmology/optometry visit for glaucoma screening and checkup ?Recommended yearly dental visit for hygiene and checkup ? ?Vaccinations: ?Influenza vaccine: due 05/19/2022 ?Pneumococcal vaccine: completed 07/18/2015 ?Tdap vaccine: completed 01/12/2014, due 01/13/2024 ?Shingles vaccine: completed   ?Covid-19:  02/20/2022, 07/23/2021, 08/29/2020, 02/07/2020, 01/10/2020 ? ?Advanced directives: Please bring a copy of your POA (Power of Attorney) and/or Living Will to your next appointment.  ? ?Conditions/risks identified: none ? ?Next appointment: Follow up in one year for your annual wellness visit.  ? ?Preventive Care 66 Years and Older, Male ?Preventive care refers to lifestyle choices and visits with your health care provider that can promote health and wellness. ?What does preventive care include? ?A yearly physical exam. This is also called an annual well check. ?Dental exams once or twice a year. ?Routine eye exams. Ask your health care provider how often you should have your eyes checked. ?Personal lifestyle choices, including: ?Daily care of your teeth and gums. ?Regular physical activity. ?Eating a healthy diet. ?Avoiding tobacco and drug use. ?Limiting alcohol use. ?Practicing safe sex. ?Taking low doses of aspirin every day. ?Taking vitamin and mineral supplements as recommended by your health care provider. ?What happens during an annual well check? ?The services and screenings done by your health care provider during your annual well check will depend on your age, overall health, lifestyle risk factors, and family history of disease. ?Counseling  ?Your health care provider  may ask you questions about your: ?Alcohol use. ?Tobacco use. ?Drug use. ?Emotional well-being. ?Home and relationship well-being. ?Sexual activity. ?Eating habits. ?History of falls. ?Memory and ability to understand (cognition). ?Work and work Statistician. ?Screening  ?You may have the following tests or measurements: ?Height, weight, and BMI. ?Blood pressure. ?Lipid and cholesterol levels. These may be checked every 5 years, or more frequently if you are over 42 years old. ?Skin check. ?Lung cancer screening. You may have this screening every year starting at age 8 if you have a 30-pack-year history of smoking and currently smoke or have quit within the past 15 years. ?Fecal occult blood test (FOBT) of the stool. You may have this test every year starting at age 61. ?Flexible sigmoidoscopy or colonoscopy. You may have a sigmoidoscopy every 5 years or a colonoscopy every 10 years starting at age 7. ?Prostate cancer screening. Recommendations will vary depending on your family history and other risks. ?Hepatitis C blood test. ?Hepatitis B blood test. ?Sexually transmitted disease (STD) testing. ?Diabetes screening. This is done by checking your blood sugar (glucose) after you have not eaten for a while (fasting). You may have this done every 1-3 years. ?Abdominal aortic aneurysm (AAA) screening. You may need this if you are a current or former smoker. ?Osteoporosis. You may be screened starting at age 71 if you are at high risk. ?Talk with your health care provider about your test results, treatment options, and if necessary, the need for more tests. ?Vaccines  ?Your health care provider may recommend certain vaccines, such as: ?Influenza vaccine. This is recommended every year. ?Tetanus, diphtheria, and acellular pertussis (Tdap, Td) vaccine. You may need a Td booster every 10 years. ?Zoster vaccine. You may need this after age 51. ?Pneumococcal 13-valent conjugate (PCV13) vaccine.  One dose is recommended after  age 71. ?Pneumococcal polysaccharide (PPSV23) vaccine. One dose is recommended after age 63. ?Talk to your health care provider about which screenings and vaccines you need and how often you need them. ?This information is not intended to replace advice given to you by your health care provider. Make sure you discuss any questions you have with your health care provider. ?Document Released: 11/01/2015 Document Revised: 06/24/2016 Document Reviewed: 08/06/2015 ?Elsevier Interactive Patient Education ? 2017 [place]. ? ?Fall Prevention in the Home ?Falls can cause injuries. They can happen to people of all ages. There are many things you can do to make your home safe and to help prevent falls. ?What can I do on the outside of my home? ?Regularly fix the edges of walkways and driveways and fix any cracks. ?Remove anything that might make you trip as you walk through a door, such as a raised step or threshold. ?Trim any bushes or trees on the path to your home. ?Use bright outdoor lighting. ?Clear any walking paths of anything that might make someone trip, such as rocks or tools. ?Regularly check to see if handrails are loose or broken. Make sure that both sides of any steps have handrails. ?Any raised decks and porches should have guardrails on the edges. ?Have any leaves, snow, or ice cleared regularly. ?Use sand or salt on walking paths during winter. ?Clean up any spills in your garage right away. This includes oil or grease spills. ?What can I do in the bathroom? ?Use night lights. ?Install grab bars by the toilet and in the tub and shower. Do not use towel bars as grab bars. ?Use non-skid mats or decals in the tub or shower. ?If you need to sit down in the shower, use a plastic, non-slip stool. ?Keep the floor dry. Clean up any water that spills on the floor as soon as it happens. ?Remove soap buildup in the tub or shower regularly. ?Attach bath mats securely with double-sided non-slip rug tape. ?Do not have  throw rugs and other things on the floor that can make you trip. ?What can I do in the bedroom? ?Use night lights. ?Make sure that you have a light by your bed that is easy to reach. ?Do not use any sheets or blankets that are too big for your bed. They should not hang down onto the floor. ?Have a firm chair that has side arms. You can use this for support while you get dressed. ?Do not have throw rugs and other things on the floor that can make you trip. ?What can I do in the kitchen? ?Clean up any spills right away. ?Avoid walking on wet floors. ?Keep items that you use a lot in easy-to-reach places. ?If you need to reach something above you, use a strong step stool that has a grab bar. ?Keep electrical cords out of the way. ?Do not use floor polish or wax that makes floors slippery. If you must use wax, use non-skid floor wax. ?Do not have throw rugs and other things on the floor that can make you trip. ?What can I do with my stairs? ?Do not leave any items on the stairs. ?Make sure that there are handrails on both sides of the stairs and use them. Fix handrails that are broken or loose. Make sure that handrails are as long as the stairways. ?Check any carpeting to make sure that it is firmly attached to the stairs. Fix any carpet that is loose or  worn. ?Avoid having throw rugs at the top or bottom of the stairs. If you do have throw rugs, attach them to the floor with carpet tape. ?Make sure that you have a light switch at the top of the stairs and the bottom of the stairs. If you do not have them, ask someone to add them for you. ?What else can I do to help prevent falls? ?Wear shoes that: ?Do not have high heels. ?Have rubber bottoms. ?Are comfortable and fit you well. ?Are closed at the toe. Do not wear sandals. ?If you use a stepladder: ?Make sure that it is fully opened. Do not climb a closed stepladder. ?Make sure that both sides of the stepladder are locked into place. ?Ask someone to hold it for you, if  possible. ?Clearly mark and make sure that you can see: ?Any grab bars or handrails. ?First and last steps. ?Where the edge of each step is. ?Use tools that help you move around (mobility aids) if the

## 2022-02-25 ENCOUNTER — Other Ambulatory Visit (INDEPENDENT_AMBULATORY_CARE_PROVIDER_SITE_OTHER): Payer: PPO

## 2022-02-25 DIAGNOSIS — E039 Hypothyroidism, unspecified: Secondary | ICD-10-CM | POA: Diagnosis not present

## 2022-02-25 LAB — TSH: TSH: 0.02 u[IU]/mL — ABNORMAL LOW (ref 0.35–5.50)

## 2022-02-25 LAB — T3, FREE: T3, Free: 2.8 pg/mL (ref 2.3–4.2)

## 2022-02-25 LAB — T4, FREE: Free T4: 1.06 ng/dL (ref 0.60–1.60)

## 2022-02-26 ENCOUNTER — Telehealth: Payer: Self-pay | Admitting: Family Medicine

## 2022-02-26 NOTE — Telephone Encounter (Signed)
Pt read result note. Pt is requesting that if he is staying on his current regimen he would like to take Synthroid 66mg daily. If this is the case, he would like a new prescription to reflect this.  ? ?

## 2022-02-26 NOTE — Telephone Encounter (Signed)
Pt seeking info on how to manage his thyroid medication. Is leaving for a trip on Sunday and would like to know if he should continue his medication and if he needs a presription ?

## 2022-02-27 ENCOUNTER — Other Ambulatory Visit: Payer: Self-pay

## 2022-02-27 DIAGNOSIS — E039 Hypothyroidism, unspecified: Secondary | ICD-10-CM

## 2022-02-27 MED ORDER — SYNTHROID 25 MCG PO TABS: 25.0000 ug | ORAL_TABLET | Freq: Every day | ORAL | 3 refills | Status: DC

## 2022-02-27 MED ORDER — LEVOTHYROXINE SODIUM 25 MCG PO TABS: 25.0000 ug | ORAL_TABLET | Freq: Every day | ORAL | 3 refills | Status: DC

## 2022-02-27 NOTE — Addendum Note (Signed)
Addended by: Alysia Penna A on: 02/27/2022 07:57 AM ? ? Modules accepted: Orders ? ?

## 2022-02-27 NOTE — Telephone Encounter (Signed)
Spoke with patient, [facility] did not have brand name Synthroid.   Synthroid 22mg for brand name sent to [facility]  ?

## 2022-02-27 NOTE — Telephone Encounter (Addendum)
Pt need synthroid not levothyroxine send to  ?[place], [place] - [address] Phone:  [phone]  ?Fax:  [phone]  ?  ?Pt need med this morning going out of town ?

## 2022-02-27 NOTE — Telephone Encounter (Signed)
We can try that. Please call in Synthroid (name brand) 25 mcg to take daily,. #90 with 3 rf ?

## 2022-04-13 ENCOUNTER — Other Ambulatory Visit: Payer: Self-pay | Admitting: Family Medicine

## 2022-04-16 ENCOUNTER — Telehealth: Payer: Self-pay | Admitting: Family Medicine

## 2022-04-16 NOTE — Telephone Encounter (Signed)
Pt called to ask when does the MD want him to come back to do some labs to check his thyroids? Pt stated he was out of town for 6 weeks and just got back. Pt also stated he will not be available the week of May 03, 2022 as he and his wife will be out of town.  Please advise.

## 2022-04-17 DIAGNOSIS — M1612 Unilateral primary osteoarthritis, left hip: Secondary | ICD-10-CM | POA: Diagnosis not present

## 2022-04-17 DIAGNOSIS — M25552 Pain in left hip: Secondary | ICD-10-CM | POA: Diagnosis not present

## 2022-04-17 NOTE — Telephone Encounter (Signed)
Last thyroid labs- 02/25/22.   Please advise

## 2022-04-20 NOTE — Telephone Encounter (Signed)
I suggest we repeat the thyroid labs 90 days after the last ones, so that would be early August

## 2022-04-23 ENCOUNTER — Other Ambulatory Visit: Payer: Self-pay

## 2022-04-23 DIAGNOSIS — E039 Hypothyroidism, unspecified: Secondary | ICD-10-CM

## 2022-04-23 NOTE — Telephone Encounter (Signed)
Spoke with patient, lab appointment scheduled for 05/27/22. Lab orders placed.

## 2022-05-12 DIAGNOSIS — L218 Other seborrheic dermatitis: Secondary | ICD-10-CM | POA: Diagnosis not present

## 2022-05-12 DIAGNOSIS — L570 Actinic keratosis: Secondary | ICD-10-CM | POA: Diagnosis not present

## 2022-05-12 DIAGNOSIS — L601 Onycholysis: Secondary | ICD-10-CM | POA: Diagnosis not present

## 2022-05-12 DIAGNOSIS — L989 Disorder of the skin and subcutaneous tissue, unspecified: Secondary | ICD-10-CM | POA: Diagnosis not present

## 2022-05-12 DIAGNOSIS — D0462 Carcinoma in situ of skin of left upper limb, including shoulder: Secondary | ICD-10-CM | POA: Diagnosis not present

## 2022-05-12 DIAGNOSIS — L821 Other seborrheic keratosis: Secondary | ICD-10-CM | POA: Diagnosis not present

## 2022-05-27 ENCOUNTER — Other Ambulatory Visit (INDEPENDENT_AMBULATORY_CARE_PROVIDER_SITE_OTHER): Payer: PPO

## 2022-05-27 DIAGNOSIS — E039 Hypothyroidism, unspecified: Secondary | ICD-10-CM | POA: Diagnosis not present

## 2022-05-27 DIAGNOSIS — M25552 Pain in left hip: Secondary | ICD-10-CM | POA: Diagnosis not present

## 2022-05-27 LAB — T4, FREE: Free T4: 1.08 ng/dL (ref 0.60–1.60)

## 2022-05-27 LAB — TSH: TSH: 0.05 u[IU]/mL — ABNORMAL LOW (ref 0.35–5.50)

## 2022-05-27 LAB — T3, FREE: T3, Free: 3.1 pg/mL (ref 2.3–4.2)

## 2022-05-27 NOTE — Addendum Note (Signed)
Addended by: Octavio Manns E on: 05/27/2022 07:53 AM   Modules accepted: Orders

## 2022-06-25 ENCOUNTER — Ambulatory Visit (INDEPENDENT_AMBULATORY_CARE_PROVIDER_SITE_OTHER): Payer: PPO | Admitting: Family Medicine

## 2022-06-25 ENCOUNTER — Encounter: Payer: Self-pay | Admitting: Family Medicine

## 2022-06-25 VITALS — BP 124/68 | HR 72 | Temp 98.2°F | Wt 249.0 lb

## 2022-06-25 DIAGNOSIS — E039 Hypothyroidism, unspecified: Secondary | ICD-10-CM

## 2022-06-25 DIAGNOSIS — Z23 Encounter for immunization: Secondary | ICD-10-CM | POA: Diagnosis not present

## 2022-06-25 DIAGNOSIS — R2242 Localized swelling, mass and lump, left lower limb: Secondary | ICD-10-CM

## 2022-06-25 MED ORDER — HYDROCODONE-ACETAMINOPHEN 10-325 MG PO TABS: 1.0000 | ORAL_TABLET | ORAL | 0 refills | Status: AC | PRN

## 2022-06-25 NOTE — Addendum Note (Signed)
Addended by: Wyvonne Lenz on: 06/25/2022 11:40 AM   Modules accepted: Orders

## 2022-06-25 NOTE — Progress Notes (Signed)
   Subjective:    Patient ID: Luke Skinner, male    DOB: 1956/01/23, 66 y.o.   MRN: [id number]  HPI Here to check a lump in the left groin that came up 4 weeks ago. It has not changed in size at all since them. It is not painful. No recent trauma to the left leg. No fevers. He has felt fine in general.    Review of Systems  Constitutional: Negative.   Respiratory: Negative.    Cardiovascular: Negative.        Objective:   Physical Exam Constitutional:      Appearance: Normal appearance. He is not ill-appearing.  Cardiovascular:     Rate and Rhythm: Normal rate and regular rhythm.     Pulses: Normal pulses.     Heart sounds: Normal heart sounds.  Pulmonary:     Effort: Pulmonary effort is normal.     Breath sounds: Normal breath sounds.  Skin:    Comments: There is a superficial mobile round non-tender firm nodular lesion in the left proximal inner thigh about 4 cm below the inguinal ligament. There is an area of ecchymosis above and around this   Neurological:     Mental Status: He is alert.           Assessment & Plan:  He has a lump in the left groin area that appeared 4 weeks ago. Possible etiologies include a lymph node, a lipoma, a sebaceous cyst, venous thrombus, etc. We will set him up for a left leg venous US to further evaluate this.  Alysia Penna, MD

## 2022-06-29 ENCOUNTER — Ambulatory Visit (HOSPITAL_COMMUNITY)
Admission: RE | Admit: 2022-06-29 | Discharge: 2022-06-29 | Disposition: A | Discharge status: Home / Self Care | Payer: PPO | Source: Ambulatory Visit | Attending: Internal Medicine | Admitting: Internal Medicine

## 2022-06-29 DIAGNOSIS — R2242 Localized swelling, mass and lump, left lower limb: Secondary | ICD-10-CM

## 2022-07-10 ENCOUNTER — Telehealth: Payer: Self-pay | Admitting: Family Medicine

## 2022-07-10 NOTE — Telephone Encounter (Signed)
Patient would like to know if he should get the newest covid vaccine, as well as the new RSV vaccine. Patient and wife travel a lot and they are about to leave for another cruise.    Patient has gotten the flu vaccine, as well as the six previous covid vaccines     Please advise

## 2022-07-10 NOTE — Telephone Encounter (Signed)
Yes he should get both the new Covid vaccine as well as RSV

## 2022-07-13 NOTE — Telephone Encounter (Signed)
Spoke with patient on 07-10-22 message given.

## 2022-08-12 ENCOUNTER — Ambulatory Visit (INDEPENDENT_AMBULATORY_CARE_PROVIDER_SITE_OTHER): Payer: PPO | Admitting: Family Medicine

## 2022-08-12 VITALS — BP 122/60 | HR 67 | Temp 97.4°F | Ht 69.0 in | Wt 250.2 lb

## 2022-08-12 DIAGNOSIS — J020 Streptococcal pharyngitis: Secondary | ICD-10-CM | POA: Diagnosis not present

## 2022-08-12 DIAGNOSIS — R059 Cough, unspecified: Secondary | ICD-10-CM | POA: Diagnosis not present

## 2022-08-12 DIAGNOSIS — J029 Acute pharyngitis, unspecified: Secondary | ICD-10-CM

## 2022-08-12 LAB — POCT RAPID STREP A (OFFICE): Rapid Strep A Screen: POSITIVE — AB

## 2022-08-12 LAB — POC COVID19 BINAXNOW: SARS Coronavirus 2 Ag: NEGATIVE

## 2022-08-12 MED ORDER — AMOXICILLIN 875 MG PO TABS: 875.0000 mg | ORAL_TABLET | Freq: Two times a day (BID) | ORAL | 0 refills | Status: AC

## 2022-08-12 NOTE — Progress Notes (Signed)
Established Patient Office Visit  Subjective   Patient ID: Luke Skinner, male    DOB: Jul 18, 1956  Age: 66 y.o. MRN: [id number]  Chief Complaint  Patient presents with   Nasal Congestion    Patient complains of nasal congestion, x5 days, Tried Mucinex with little relief   Sinusitis    HPI   Seen today with some nasal congestion and postnasal drainage for about 4 days.  Has tried over-the-counter Mucinex without much improvement.  He does wife just recently got back from a cruise.  He has had some malaise and body aches but no documented fever.  Has had some facial pressure.  Minimal cough.  Wife is currently asymptomatic.  Past Medical History:  Diagnosis Date   Allergy    Arthritis    oa   Herpes stomatitis    fever blisters occasional   History of kidney stones    Hyperlipidemia    Hypertension    Hypothyroidism    Pneumonia 2006 last time   several times    Rash    below both knees for many years uses selsum blue to wash with   Past Surgical History:  Procedure Laterality Date   COLONOSCOPY  02/24/2021   per Dr. Henrene Pastor, adenomatous polyp, repeat in 7 yrs   ESOPHAGUS SURGERY     as infant    [place]   per Dr. Annamaria Boots   HERNIA REPAIR     left inguinal    KNEE ARTHROSCOPY Left 1973, [place] and 2003   NASAL SEPTUM SURGERY     SHOULDER ARTHROSCOPY  2005   on right per Dr. Wynelle Link   TOTAL HIP ARTHROPLASTY Right 10/21/2016   Procedure: RIGHT TOTAL HIP ARTHROPLASTY ANTERIOR APPROACH;  Surgeon: Gaynelle Arabian, MD;  Location: [facility];  Service: Orthopedics;  Laterality: Right;   TOTAL KNEE ARTHROPLASTY  2008   on left per Dr. Wynelle Link    reports that he quit smoking about 23 years ago. His smoking use included cigarettes. He has a 14.00 pack-year smoking history. He has never used smokeless tobacco. He reports that he does not drink alcohol and does not use drugs. family history includes Diabetes in his maternal uncle; Heart disease in his mother;  Parkinsonism in his father; Thyroid disease in his mother and sister. Allergies  Allergen Reactions   Simvastatin     Joint pain.     Sulfamethoxazole     REACTION: hives    Review of Systems  Constitutional:  Positive for malaise/fatigue. Negative for chills and fever.  HENT:  Positive for congestion and sinus pain. Negative for ear pain.   Cardiovascular:  Negative for chest pain.      Objective:     BP 122/60 (BP Location: Left Arm, Patient Position: Sitting, Cuff Size: Large)   Pulse 67   Temp (!) 97.4 F (36.3 C) (Oral)   Ht '5\' 9"'$  (1.753 m)   Wt 250 lb 3.2 oz (113.5 kg)   SpO2 95%   BMI 36.95 kg/m  BP Readings from Last 3 Encounters:  08/12/22 122/60  06/25/22 124/68  01/28/22 128/64   Wt Readings from Last 3 Encounters:  08/12/22 250 lb 3.2 oz (113.5 kg)  06/25/22 249 lb (112.9 kg)  02/23/22 239 lb (108.4 kg)      Physical Exam Vitals reviewed.  Constitutional:      General: He is not in acute distress.    Appearance: He is not ill-appearing.  HENT:     Mouth/Throat:  Comments: Only very mild posterior pharynx erythema.  No exudate. Cardiovascular:     Rate and Rhythm: Normal rate and regular rhythm.  Pulmonary:     Effort: Pulmonary effort is normal.     Breath sounds: Normal breath sounds. No wheezing or rales.  Musculoskeletal:     Cervical back: Neck supple.  Lymphadenopathy:     Cervical: No cervical adenopathy.  Neurological:     Mental Status: He is alert.      Results for orders placed or performed in visit on 08/12/22  POC COVID-19  Result Value Ref Range   SARS Coronavirus 2 Ag Negative Negative  POC Rapid Strep A  Result Value Ref Range   Rapid Strep A Screen Positive (A) Negative      The 10-year ASCVD risk score (Arnett DK, et al., 2019) is: 14.4%    Assessment & Plan:   Problem List Items Addressed This Visit   None Visit Diagnoses     Cough, unspecified type    -  Primary   Relevant Orders   POC COVID-19  (Completed)   Sore throat       Relevant Orders   POC COVID-19 (Completed)   POC Rapid Strep A (Completed)     Rapid strep surprisingly came back positive.  This is somewhat surprising in the absence of sore throat or fever.  COVID screen came back negative.  -Start amoxicillin 875 mg twice daily for 10 days -Over-the-counter analgesics as needed -follow-up for any persistent or worsening symptoms  No follow-ups on file.    Carolann Littler, MD

## 2022-08-17 DIAGNOSIS — M19012 Primary osteoarthritis, left shoulder: Secondary | ICD-10-CM | POA: Diagnosis not present

## 2022-08-19 ENCOUNTER — Telehealth: Payer: Self-pay | Admitting: Family Medicine

## 2022-08-19 MED ORDER — HYDROCODONE BIT-HOMATROP MBR 5-1.5 MG/5ML PO SOLN: 5.0000 mL | ORAL | 0 refills | Status: DC | PRN

## 2022-08-19 NOTE — Telephone Encounter (Signed)
Pharmacy updated.

## 2022-08-19 NOTE — Telephone Encounter (Signed)
Done

## 2022-08-19 NOTE — Telephone Encounter (Signed)
Left detailed message informing  of update. 

## 2022-08-19 NOTE — Telephone Encounter (Signed)
Pt seen dr Elease Hashimoto on 08/12/2022 and still has abx until 08-21-2022 and pt would like hydrocodone cough med. Pt is still cough. Please send to  [place], [place] - [place] [address] Phone:  [phone]  Fax:  [phone]

## 2022-08-19 NOTE — Telephone Encounter (Signed)
Pt wife is aware

## 2022-09-28 NOTE — Patient Instructions (Signed)
SURGICAL WAITING ROOM VISITATION Patients having surgery or a procedure may have no more than 2 support people in the waiting area - these visitors may rotate.   Children under the age of 49 must have an adult with them who is not the patient. If the patient needs to stay at the hospital during part of their recovery, the visitor guidelines for inpatient rooms apply. Pre-op nurse will coordinate an appropriate time for 1 support person to accompany patient in pre-op.  This support person may not rotate.    Please refer to the [facility] website for the visitor guidelines for Inpatients (after your surgery is over and you are in a regular room).       Your procedure is scheduled on:  12/20?2023    Report to [facility] Main Entrance    Report to admitting at   [place]   Call this number if you have problems the morning of surgery [phone]   Do not eat food :After Midnight.   After Midnight you may have the following liquids until ___ 0530___ Am  DAY OF SURGERY  Water Non-Citrus Juices (without pulp, NO RED) Carbonated Beverages Black Coffee (NO MILK/CREAM OR CREAMERS, sugar ok)  Clear Tea (NO MILK/CREAM OR CREAMERS, sugar ok) regular and decaf                             Plain Jell-O (NO RED)                                           Fruit ices (not with fruit pulp, NO RED)                                     Popsicles (NO RED)                                                               Sports drinks like Gatorade (NO RED)                    The day of surgery:  Drink ONE (1) Pre-Surgery Clear Ensure or G2 at  0530 AM ( have completed by )  the morning of surgery. Drink in one sitting. Do not sip.  This drink was given to you during your hospital  pre-op appointment visit. Nothing else to drink after completing the  Pre-Surgery Clear Ensure or G2.          If you have questions, please contact your surgeon's office.      Oral Hygiene is also important to  reduce your risk of infection.                                    Remember - BRUSH YOUR TEETH THE MORNING OF SURGERY WITH YOUR REGULAR TOOTHPASTE  DENTURES WILL BE REMOVED PRIOR TO SURGERY PLEASE DO NOT APPLY "Poly grip" OR ADHESIVES!!!   Do NOT smoke after Midnight   Take these medicines  the morning of surgery with A SIP OF WATER:  toprol, synthroid   DO NOT TAKE ANY ORAL DIABETIC MEDICATIONS DAY OF YOUR SURGERY  Bring CPAP mask and tubing day of surgery.                              You may not have any metal on your body including hair pins, jewelry, and body piercing             Do not wear make-up, lotions, powders, perfumes/cologne, or deodorant  Do not wear nail polish including gel and S&S, artificial/acrylic nails, or any other type of covering on natural nails including finger and toenails. If you have artificial nails, gel coating, etc. that needs to be removed by a nail salon please have this removed prior to surgery or surgery may need to be canceled/ delayed if the surgeon/ anesthesia feels like they are unable to be safely monitored.   Do not shave  48 hours prior to surgery.               Men may shave face and neck.   Do not bring valuables to the hospital. [place].   Contacts, glasses, dentures or bridgework may not be worn into surgery.   Bring small overnight bag day of surgery.   DO NOT [place]. PHARMACY WILL DISPENSE MEDICATIONS LISTED ON YOUR MEDICATION LIST TO YOU DURING YOUR ADMISSION Portal!    Patients discharged on the day of surgery will not be allowed to drive home.  Someone NEEDS to stay with you for the first 24 hours after anesthesia.   Special Instructions: Bring a copy of your healthcare power of attorney and living will documents the day of surgery if you haven't scanned them before.              Please read over the following fact sheets you were  given: IF [place] [phone]   If you received a COVID test during your pre-op visit  it is requested that you wear a mask when out in public, stay away from anyone that may not be feeling well and notify your surgeon if you develop symptoms. If you test positive for Covid or have been in contact with anyone that has tested positive in the last 10 days please notify you surgeon.    [place] - Preparing for Surgery Before surgery, you can play an important role.  Because skin is not sterile, your skin needs to be as free of germs as possible.  You can reduce the number of germs on your skin by washing with CHG (chlorahexidine gluconate) soap before surgery.  CHG is an antiseptic cleaner which kills germs and bonds with the skin to continue killing germs even after washing. Please DO NOT use if you have an allergy to CHG or antibacterial soaps.  If your skin becomes reddened/irritated stop using the CHG and inform your nurse when you arrive at Short Stay. Do not shave (including legs and underarms) for at least 48 hours prior to the first CHG shower.  You may shave your face/neck. Please follow these instructions carefully:  1.  Shower with CHG Soap the night before surgery and the  morning of Surgery.  2.  If you choose to wash your hair, wash your hair first as usual with your  normal  shampoo.  3.  After you shampoo, rinse your hair and body thoroughly to remove the  shampoo.                           4.  Use CHG as you would any other liquid soap.  You can apply chg directly  to the skin and wash                       Gently with a scrungie or clean washcloth.  5.  Apply the CHG Soap to your body ONLY FROM THE NECK DOWN.   Do not use on face/ open                           Wound or open sores. Avoid contact with eyes, ears mouth and genitals (private parts).                       Wash face,  Genitals (private parts) with your normal soap.              6.  Wash thoroughly, paying special attention to the area where your surgery  will be performed.  7.  Thoroughly rinse your body with warm water from the neck down.  8.  DO NOT shower/wash with your normal soap after using and rinsing off  the CHG Soap.                9.  Pat yourself dry with a clean towel.            10.  Wear clean pajamas.            11.  Place clean sheets on your bed the night of your first shower and do not  sleep with pets. Day of Surgery : Do not apply any lotions/deodorants the morning of surgery.  Please wear clean clothes to the hospital/surgery center.  FAILURE TO FOLLOW THESE INSTRUCTIONS MAY RESULT IN THE CANCELLATION OF YOUR SURGERY PATIENT SIGNATURE_________________________________  NURSE SIGNATURE__________________________________  ________________________________________________________________________

## 2022-09-28 NOTE — Progress Notes (Signed)
Anesthesia Review:  PCP: Alysia Penna- LOV 01/28/22 ,  LOV 10/25/23Darnell Level Burchette  Cardiologist : none  Chest x-ray : EKG : 09/29/22  Echo : Stress test 2016  Cardiac Cath :  Activity level: can do a flight of stairs without difficutly  Sleep Study/ CPAP : none  Fasting Blood Sugar :      / Checks Blood Sugar -- times a day:   Blood Thinner/ Instructions /Last Dose: ASA / Instructions/ Last Dose :

## 2022-09-29 ENCOUNTER — Encounter (HOSPITAL_COMMUNITY): Payer: Self-pay

## 2022-09-29 ENCOUNTER — Encounter (HOSPITAL_COMMUNITY)
Admission: RE | Admit: 2022-09-29 | Discharge: 2022-09-29 | Disposition: A | Discharge status: Home / Self Care | Payer: PPO | Source: Ambulatory Visit | Attending: Orthopedic Surgery | Admitting: Orthopedic Surgery

## 2022-09-29 ENCOUNTER — Other Ambulatory Visit: Payer: Self-pay

## 2022-09-29 VITALS — BP 151/70 | HR 63 | Temp 98.1°F | Resp 16 | Ht 70.0 in | Wt 240.0 lb

## 2022-09-29 DIAGNOSIS — Z01818 Encounter for other preprocedural examination: Secondary | ICD-10-CM | POA: Diagnosis not present

## 2022-09-29 DIAGNOSIS — R9431 Abnormal electrocardiogram [ECG] [EKG]: Secondary | ICD-10-CM | POA: Diagnosis not present

## 2022-09-29 LAB — CBC
HCT: 42.9 % (ref 39.0–52.0)
Hemoglobin: 13.5 g/dL (ref 13.0–17.0)
MCH: 27.8 pg (ref 26.0–34.0)
MCHC: 31.5 g/dL (ref 30.0–36.0)
MCV: 88.5 fL (ref 80.0–100.0)
Platelets: 244 10*3/uL (ref 150–400)
RBC: 4.85 MIL/uL (ref 4.22–5.81)
RDW: 12.9 % (ref 11.5–15.5)
WBC: 7.6 10*3/uL (ref 4.0–10.5)
nRBC: 0 % (ref 0.0–0.2)

## 2022-09-29 LAB — SURGICAL PCR SCREEN
MRSA, PCR: NEGATIVE
Staphylococcus aureus: NEGATIVE

## 2022-09-29 LAB — BASIC METABOLIC PANEL
Anion gap: 6 (ref 5–15)
BUN: 18 mg/dL (ref 8–23)
CO2: 28 mmol/L (ref 22–32)
Calcium: 9.8 mg/dL (ref 8.9–10.3)
Chloride: 105 mmol/L (ref 98–111)
Creatinine, Ser: 1.08 mg/dL (ref 0.61–1.24)
GFR, Estimated: >60 mL/min (ref >60)
Glucose, Bld: 134 mg/dL — ABNORMAL HIGH (ref 70–99)
Potassium: 4.3 mmol/L (ref 3.5–5.1)
Sodium: 139 mmol/L (ref 135–145)

## 2022-09-30 NOTE — H&P (Signed)
TOTAL HIP ADMISSION H&P  Patient is admitted for left total hip arthroplasty.  Subjective:  Chief Complaint: Left hip pain  HPI: Luke Skinner, 66 y.o. male, has a history of pain and functional disability in the left hip due to arthritis and patient has failed non-surgical conservative treatments for greater than 12 weeks to include NSAID's and/or analgesics and activity modification. Onset of symptoms was gradual, starting  several  years ago with gradually worsening course since that time. The patient noted no past surgery on the left hip. Patient currently rates pain in the left hip at 8 out of 10 with activity. Patient has night pain, worsening of pain with activity and weight bearing, pain that interfers with activities of daily living, and pain with passive range of motion. Patient has evidence of  bone-on-bone arthritis in the left hip  by imaging studies. This condition presents safety issues increasing the risk of falls. There is no current active infection.  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   Erectile dysfunction 03/17/2017   OA (osteoarthritis) of hip 10/21/2016   Abnormal EKG 07/22/2015   ELEVATED PROSTATE SPECIFIC ANTIGEN 01/08/2010   DIZZINESS 05/03/2009   ACTINIC SKIN DAMAGE 01/11/2009   Essential hypertension 11/27/2008   DEPRESSION 12/20/2007   Hyperlipidemia 10/06/2007   Hypothyroidism 06/03/2007   ALLERGIC RHINITIS 06/03/2007   OSTEOARTHRITIS 06/03/2007    Past Medical History:  Diagnosis Date   Allergy    Arthritis    oa   Herpes stomatitis    fever blisters occasional   History of kidney stones    Hyperlipidemia    Hypertension    Hypothyroidism    Pneumonia 2006 last time   several times    Rash    below both knees for many years uses selsum blue to wash with    Past  Surgical History:  Procedure Laterality Date   COLONOSCOPY  02/24/2021   per Dr. Henrene Pastor, adenomatous polyp, repeat in 7 yrs   ESOPHAGUS SURGERY     as infant    GASTROSTOMY  1989   per Dr. Annamaria Boots   HERNIA REPAIR     left inguinal    KNEE ARTHROSCOPY Left 1973, 1975, 1990 and 2003   NASAL SEPTUM SURGERY     SHOULDER ARTHROSCOPY  2005   on right per Dr. Wynelle Link   TOTAL HIP ARTHROPLASTY Right 10/21/2016   Procedure: RIGHT TOTAL HIP ARTHROPLASTY ANTERIOR APPROACH;  Surgeon: Gaynelle Arabian, MD;  Location: [facility];  Service: Orthopedics;  Laterality: Right;   TOTAL KNEE ARTHROPLASTY  2008   on left per Dr. Wynelle Link    Prior to Admission medications   Medication Sig Start Date End Date Taking? Authorizing Provider  amoxicillin (AMOXIL) 500 MG tablet Take 2,000 mg by mouth See admin instructions. Takes before dental procedures   Yes [provider]  Ascorbic Acid (VITAMIN C) 1000 MG tablet Take 1,000 mg by mouth daily.   Yes [provider]  B Complex Vitamins (VITAMIN B COMPLEX PO) Take 1 tablet by mouth daily.   Yes [provider]  diphenhydrAMINE (BENADRYL) 25 MG tablet Take 25 mg by mouth at bedtime. For sleep.   Yes [provider]  ezetimibe (ZETIA) 10 MG tablet Take 1 tablet (10 mg total) by mouth daily. 01/14/22  Yes Laurey Morale, MD  hydrochlorothiazide (HYDRODIURIL) 25 MG  tablet Take 1 tablet (25 mg total) by mouth daily. 01/14/22  Yes Laurey Morale, MD  HYDROcodone-acetaminophen (NORCO) 10-325 MG tablet Take 1 tablet by mouth every 6 (six) hours as needed for severe pain.   Yes [provider]  ketoconazole (NIZORAL) 2 % cream Apply 1 application topically 2 (two) times daily. Patient taking differently: Apply 1 application  topically 2 (two) times daily as needed for irritation. 12/08/16  Yes Laurey Morale, MD  Lactobacillus (PROBIOTIC ACIDOPHILUS) CAPS Take 1 capsule by mouth daily.   Yes [provider]  meloxicam (MOBIC) 15 MG  tablet Take 1 tablet (15 mg total) by mouth daily as needed. Patient taking differently: Take 15 mg by mouth daily. 01/14/22  Yes Laurey Morale, MD  methocarbamol (ROBAXIN) 500 MG tablet Take 1 tablet (500 mg total) by mouth every 6 (six) hours as needed for muscle spasms. 01/13/21  Yes Laurey Morale, MD  metoprolol succinate (TOPROL-XL) 50 MG 24 hr tablet TAKE 1 TABLET BY MOUTH ONCE DAILY WITH  OR  IMMEDIATELY  FOLLOWING  A  MEAL. Patient taking differently: Take 50 mg by mouth at bedtime. TAKE 1 TABLET BY MOUTH ONCE DAILY WITH  OR  IMMEDIATELY  FOLLOWING  A  MEAL. 01/14/22  Yes Laurey Morale, MD  mometasone (ELOCON) 0.1 % cream Apply topically as needed. Patient taking differently: Apply 1 Application topically daily as needed (irritation). 12/08/16  Yes Laurey Morale, MD  Multiple Vitamins-Minerals (MENS MULTIVITAMIN PO) Take 1 tablet by mouth at bedtime.   Yes [provider]  Potassium 99 MG TABS Take 99 mg by mouth at bedtime.   Yes [provider]  Potassium Gluconate 595 MG CAPS Take 595 mg by mouth at bedtime.   Yes [provider]  pyridoxine (B-6) 100 MG tablet Take 100 mg by mouth daily.   Yes [provider]  sildenafil (VIAGRA) 100 MG tablet Take 1 tablet (100 mg total) by mouth daily as needed for erectile dysfunction. 01/13/21  Yes Laurey Morale, MD  SYNTHROID 25 MCG tablet Take 1 tablet (25 mcg total) by mouth daily before breakfast. 02/27/22  Yes Laurey Morale, MD  telmisartan (MICARDIS) 80 MG tablet Take 1 tablet (80 mg total) by mouth daily. 01/14/22  Yes Laurey Morale, MD  traMADol (ULTRAM) 50 MG tablet Take 50 mg by mouth every 6 (six) hours as needed for severe pain.   Yes [provider]  valACYclovir (VALTREX) 1000 MG tablet Take 1 tablet (1,000 mg total) by mouth 2 (two) times daily as needed (fever bloisters). 01/13/21  Yes Laurey Morale, MD  vitamin B-12 (CYANOCOBALAMIN) 500 MCG tablet Take 500 mcg by mouth daily.   Yes  [provider]  HYDROcodone bit-homatropine (HYCODAN) 5-1.5 MG/5ML syrup Take 5 mLs by mouth every 4 (four) hours as needed for cough. Patient not taking: Reported on 09/28/2022 08/19/22   Laurey Morale, MD    Allergies  Allergen Reactions   Simvastatin     Joint pain.     Sulfa Antibiotics Hives    Social History   Socioeconomic History   Marital status: Married    Spouse name: Not on file   Number of children: Not on file   Years of education: 12   Highest education level: Not on file  Occupational History   Not on file  Tobacco Use   Smoking status: Former    Packs/day: 0.50    Years: 28.00  Total pack years: 14.00    Types: Cigarettes    Quit date: 10/19/1998    Years since quitting: 23.9   Smokeless tobacco: Never  Vaping Use   Vaping Use: Never used  Substance and Sexual Activity   Alcohol use: No    Alcohol/week: 0.0 standard drinks of alcohol   Drug use: No   Sexual activity: Yes  Other Topics Concern   Not on file  Social History Narrative   Epworth Sleepiness Scale = 10 (07/22/15)   Social Determinants of Health   Financial Resource Strain: Low Risk  (02/23/2022)   Overall Financial Resource Strain (CARDIA)    Difficulty of Paying Living Expenses: Not hard at all  Food Insecurity: No Food Insecurity (02/23/2022)   Hunger Vital Sign    Worried About Running Out of Food in the Last Year: Never true    Ran Out of Food in the Last Year: Never true  Transportation Needs: No Transportation Needs (02/23/2022)   PRAPARE - Hydrologist (Medical): No    Lack of Transportation (Non-Medical): No  Physical Activity: Sufficiently Active (02/23/2022)   Exercise Vital Sign    Days of Exercise per Week: 3 days    Minutes of Exercise per Session: 90 min  Stress: No Stress Concern Present (02/23/2022)   [place]    Feeling of Stress : Not at all  Social Connections: Not  on file  Intimate Partner Violence: Not on file    Tobacco Use: Medium Risk (09/29/2022)   Patient History    Smoking Tobacco Use: Former    Smokeless Tobacco Use: Never    Passive Exposure: Not on file   Social History   Substance and Sexual Activity  Alcohol Use No   Alcohol/week: 0.0 standard drinks of alcohol    Family History  Problem Relation Age of Onset   Heart disease Mother    Thyroid disease Mother    Parkinsonism Father    Thyroid disease Sister    Diabetes Maternal Uncle    Colon cancer Neg Hx    Colon polyps Neg Hx    Esophageal cancer Neg Hx    Rectal cancer Neg Hx    Stomach cancer Neg Hx     Review of Systems  Constitutional:  Negative for chills and fever.  HENT:  Negative for congestion, sore throat and tinnitus.   Eyes:  Negative for double vision, photophobia and pain.  Respiratory:  Negative for cough, shortness of breath and wheezing.   Cardiovascular:  Negative for chest pain, palpitations and orthopnea.  Gastrointestinal:  Negative for heartburn, nausea and vomiting.  Genitourinary:  Negative for dysuria, frequency and urgency.  Musculoskeletal:  Positive for joint pain.  Neurological:  Negative for dizziness, weakness and headaches.     Objective:  Physical Exam: Well nourished and well developed.  General: Alert and oriented x3, cooperative and pleasant, no acute distress.  Head: normocephalic, atraumatic, neck supple.  Eyes: EOMI.  Musculoskeletal:  Left Hip Exam:  The range of motion: Flexion to 100 degrees, Internal Rotation to 10 degrees, External Rotation to 30 degrees, and abduction to 30 degrees with some discomfort on abduction.  There is no tenderness over the greater trochanteric bursa.   Calves soft and nontender. Motor function intact in LE. Strength 5/5 LE bilaterally. Neuro: Distal pulses 2+. Sensation to light touch intact in LE.  Imaging Review Plain radiographs demonstrate severe degenerative joint disease of the  left hip. The bone quality appears to be adequate for age and reported activity level.  Assessment/Plan:  End stage arthritis, left hip  The patient history, physical examination, clinical judgement of the provider and imaging studies are consistent with end stage degenerative joint disease of the left hip and total hip arthroplasty is deemed medically necessary. The treatment options including medical management, injection therapy, arthroscopy and arthroplasty were discussed at length. The risks and benefits of total hip arthroplasty were presented and reviewed. The risks due to aseptic loosening, infection, stiffness, dislocation/subluxation, thromboembolic complications and other imponderables were discussed. The patient acknowledged the explanation, agreed to proceed with the plan and consent was signed. Patient is being admitted for inpatient treatment for surgery, pain control, PT, OT, prophylactic antibiotics, VTE prophylaxis, progressive ambulation and ADLs and discharge planning.The patient is planning to be discharged  home .   Patient's anticipated LOS is less than 2 midnights, meeting these requirements: - Lives within 1 hour of care - Has a competent adult at home to recover with post-op recover - NO history of  - Chronic pain requiring opioids  - Diabetes  - Coronary Artery Disease  - Heart failure  - Heart attack  - Stroke  - DVT/VTE  - Cardiac arrhythmia  - Respiratory Failure/COPD  - Renal failure  - Anemia  - Advanced Liver disease  Therapy Plans: HEP Disposition: Home with wife Planned DVT Prophylaxis: Aspirin 325 mg BID DME Needed: [facility] PCP: Alysia Penna, MD (clearance received) TXA: IV Allergies: Sulfa, statins Anesthesia Concerns: None BMI: 36.3 Last HgbA1c: 6.3% (12/2021). Not diabetic. [facility]acy: [facility] ([address])  Other: - Hydrocodone works better for him than oxycodone - Currently prescribed Norco 10-325, only takes twice a week or so prior to  golf. Discussed Norco 5-10 q6 following surgery.  - Patient was instructed on what medications to stop prior to surgery. - Follow-up visit in 2 weeks with Dr. Wynelle Link - Begin physical therapy following surgery - Pre-operative lab work as pre-surgical testing - Prescriptions will be provided in hospital at time of discharge  Theresa Duty, PA-C Orthopedic Surgery [facility] [place]

## 2022-10-02 DIAGNOSIS — E230 Hypopituitarism: Secondary | ICD-10-CM | POA: Diagnosis not present

## 2022-10-02 DIAGNOSIS — F329 Major depressive disorder, single episode, unspecified: Secondary | ICD-10-CM | POA: Diagnosis not present

## 2022-10-02 DIAGNOSIS — G609 Hereditary and idiopathic neuropathy, unspecified: Secondary | ICD-10-CM | POA: Diagnosis not present

## 2022-10-02 DIAGNOSIS — I10 Essential (primary) hypertension: Secondary | ICD-10-CM | POA: Diagnosis not present

## 2022-10-02 DIAGNOSIS — E782 Mixed hyperlipidemia: Secondary | ICD-10-CM | POA: Diagnosis not present

## 2022-10-02 DIAGNOSIS — E039 Hypothyroidism, unspecified: Secondary | ICD-10-CM | POA: Diagnosis not present

## 2022-10-05 DIAGNOSIS — E039 Hypothyroidism, unspecified: Secondary | ICD-10-CM | POA: Diagnosis not present

## 2022-10-05 DIAGNOSIS — G609 Hereditary and idiopathic neuropathy, unspecified: Secondary | ICD-10-CM | POA: Diagnosis not present

## 2022-10-05 DIAGNOSIS — E230 Hypopituitarism: Secondary | ICD-10-CM | POA: Diagnosis not present

## 2022-10-06 NOTE — Anesthesia Preprocedure Evaluation (Signed)
Anesthesia Evaluation  Patient identified by MRN, date of birth, ID band Patient awake    Reviewed: Allergy & Precautions, NPO status , Patient's Chart, lab work & pertinent test results, reviewed documented beta blocker date and time   Airway Mallampati: III  TM Distance: >3 FB Neck ROM: Full    Dental no notable dental hx. (+) Dental Advisory Given, Teeth Intact   Pulmonary former smoker   Pulmonary exam normal breath sounds clear to auscultation       Cardiovascular hypertension, Pt. on medications and Pt. on home beta blockers Normal cardiovascular exam Rhythm:Regular Rate:Normal  HLD  Stress Test 2016 normal   Neuro/Psych  PSYCHIATRIC DISORDERS  Depression    negative neurological ROS     GI/Hepatic negative GI ROS, Neg liver ROS,,,  Endo/Other  Hypothyroidism    Renal/GU negative Renal ROS  negative genitourinary   Musculoskeletal  (+) Arthritis ,    Abdominal   Peds  Hematology negative hematology ROS (+)   Anesthesia Other Findings   Reproductive/Obstetrics                             Anesthesia Physical Anesthesia Plan  ASA: 2  Anesthesia Plan: Spinal   Post-op Pain Management: Ofirmev IV (intra-op)*   Induction:   PONV Risk Score and Plan: 1 and Treatment may vary due to age or medical condition, Midazolam, Propofol infusion, Dexamethasone and Ondansetron  Airway Management Planned: Natural Airway  Additional Equipment:   Intra-op Plan:   Post-operative Plan:   Informed Consent: I have reviewed the patients History and Physical, chart, labs and discussed the procedure including the risks, benefits and alternatives for the proposed anesthesia with the patient or authorized representative who has indicated his/her understanding and acceptance.     Dental advisory given  Plan Discussed with: CRNA  Anesthesia Plan Comments:        Anesthesia Quick  Evaluation

## 2022-10-07 ENCOUNTER — Other Ambulatory Visit: Payer: Self-pay

## 2022-10-07 ENCOUNTER — Encounter (HOSPITAL_COMMUNITY)
Admission: RE | Disposition: A | Discharge status: Home / Self Care | Payer: Self-pay | Source: Home / Self Care | Attending: Orthopedic Surgery

## 2022-10-07 ENCOUNTER — Encounter (HOSPITAL_COMMUNITY): Payer: Self-pay | Admitting: Orthopedic Surgery

## 2022-10-07 ENCOUNTER — Ambulatory Visit (HOSPITAL_COMMUNITY): Payer: PPO

## 2022-10-07 ENCOUNTER — Ambulatory Visit (HOSPITAL_COMMUNITY): Payer: PPO | Admitting: Physician Assistant

## 2022-10-07 ENCOUNTER — Observation Stay (HOSPITAL_COMMUNITY): Payer: PPO

## 2022-10-07 ENCOUNTER — Ambulatory Visit (HOSPITAL_BASED_OUTPATIENT_CLINIC_OR_DEPARTMENT_OTHER): Payer: PPO | Admitting: Certified Registered"

## 2022-10-07 ENCOUNTER — Observation Stay (HOSPITAL_COMMUNITY)
Admission: RE | Admit: 2022-10-07 | Discharge: 2022-10-08 | Disposition: A | Discharge status: Home / Self Care | Payer: PPO | Attending: Orthopedic Surgery | Admitting: Orthopedic Surgery

## 2022-10-07 DIAGNOSIS — Z96652 Presence of left artificial knee joint: Secondary | ICD-10-CM | POA: Diagnosis not present

## 2022-10-07 DIAGNOSIS — Z96642 Presence of left artificial hip joint: Secondary | ICD-10-CM | POA: Diagnosis not present

## 2022-10-07 DIAGNOSIS — E039 Hypothyroidism, unspecified: Secondary | ICD-10-CM | POA: Insufficient documentation

## 2022-10-07 DIAGNOSIS — Z87891 Personal history of nicotine dependence: Secondary | ICD-10-CM | POA: Diagnosis not present

## 2022-10-07 DIAGNOSIS — Z471 Aftercare following joint replacement surgery: Secondary | ICD-10-CM | POA: Diagnosis not present

## 2022-10-07 DIAGNOSIS — M1612 Unilateral primary osteoarthritis, left hip: Secondary | ICD-10-CM | POA: Diagnosis not present

## 2022-10-07 DIAGNOSIS — Z96641 Presence of right artificial hip joint: Secondary | ICD-10-CM | POA: Insufficient documentation

## 2022-10-07 DIAGNOSIS — Z8616 Personal history of COVID-19: Secondary | ICD-10-CM | POA: Insufficient documentation

## 2022-10-07 DIAGNOSIS — M169 Osteoarthritis of hip, unspecified: Secondary | ICD-10-CM | POA: Diagnosis present

## 2022-10-07 DIAGNOSIS — Z96643 Presence of artificial hip joint, bilateral: Secondary | ICD-10-CM | POA: Diagnosis not present

## 2022-10-07 DIAGNOSIS — Z79899 Other long term (current) drug therapy: Secondary | ICD-10-CM | POA: Diagnosis not present

## 2022-10-07 DIAGNOSIS — Z01818 Encounter for other preprocedural examination: Secondary | ICD-10-CM

## 2022-10-07 DIAGNOSIS — I10 Essential (primary) hypertension: Secondary | ICD-10-CM | POA: Insufficient documentation

## 2022-10-07 HISTORY — PX: TOTAL HIP ARTHROPLASTY: SHX124

## 2022-10-07 LAB — TYPE AND SCREEN
ABO/RH(D): O POS
Antibody Screen: NEGATIVE

## 2022-10-07 SURGERY — ARTHROPLASTY, HIP, TOTAL, ANTERIOR APPROACH
Anesthesia: Spinal | Site: Hip | Laterality: Left

## 2022-10-07 MED ORDER — ONDANSETRON HCL 4 MG/2ML IJ SOLN: 4.0000 mg | Freq: Four times a day (QID) | INTRAMUSCULAR | Status: DC | PRN

## 2022-10-07 MED ORDER — LIDOCAINE HCL (PF) 2 % IJ SOLN
INTRAMUSCULAR | Status: AC
  Filled 2022-10-07: qty 5

## 2022-10-07 MED ORDER — DOCUSATE SODIUM 100 MG PO CAPS
100.0000 mg | ORAL_CAPSULE | Freq: Two times a day (BID) | ORAL | Status: DC
  Administered 2022-10-07 – 2022-10-08 (×2): 100 mg via ORAL
  Filled 2022-10-07 (×2): qty 1

## 2022-10-07 MED ORDER — METOPROLOL SUCCINATE ER 50 MG PO TB24
50.0000 mg | ORAL_TABLET | Freq: Every day | ORAL | Status: DC
  Administered 2022-10-07: 50 mg via ORAL
  Filled 2022-10-07: qty 1

## 2022-10-07 MED ORDER — MIDAZOLAM HCL 5 MG/5ML IJ SOLN
INTRAMUSCULAR | Status: DC | PRN
  Administered 2022-10-07 (×2): 1 mg via INTRAVENOUS

## 2022-10-07 MED ORDER — PROPOFOL 1000 MG/100ML IV EMUL
INTRAVENOUS | Status: AC
  Filled 2022-10-07: qty 100

## 2022-10-07 MED ORDER — CHLORHEXIDINE GLUCONATE 0.12 % MT SOLN
15.0000 mL | Freq: Once | OROMUCOSAL | Status: AC
  Administered 2022-10-07: 15 mL via OROMUCOSAL

## 2022-10-07 MED ORDER — METOCLOPRAMIDE HCL 5 MG PO TABS: 5.0000 mg | ORAL_TABLET | Freq: Three times a day (TID) | ORAL | Status: DC | PRN

## 2022-10-07 MED ORDER — BUPIVACAINE IN DEXTROSE 0.75-8.25 % IT SOLN
INTRATHECAL | Status: DC | PRN
  Administered 2022-10-07: 1.8 mL via INTRATHECAL

## 2022-10-07 MED ORDER — FENTANYL CITRATE PF 50 MCG/ML IJ SOSY: 25.0000 ug | PREFILLED_SYRINGE | INTRAMUSCULAR | Status: DC | PRN

## 2022-10-07 MED ORDER — LIDOCAINE HCL (CARDIAC) PF 100 MG/5ML IV SOSY
PREFILLED_SYRINGE | INTRAVENOUS | Status: DC | PRN
  Administered 2022-10-07: 40 mg via INTRAVENOUS

## 2022-10-07 MED ORDER — BUPIVACAINE HCL 0.25 % IJ SOLN
INTRAMUSCULAR | Status: AC
  Filled 2022-10-07: qty 1

## 2022-10-07 MED ORDER — POTASSIUM 99 MG PO TABS: 99.0000 mg | ORAL_TABLET | Freq: Every day | ORAL | Status: DC

## 2022-10-07 MED ORDER — WATER FOR IRRIGATION, STERILE IR SOLN
Status: DC | PRN
  Administered 2022-10-07: 2000 mL

## 2022-10-07 MED ORDER — DEXAMETHASONE SODIUM PHOSPHATE 10 MG/ML IJ SOLN
10.0000 mg | Freq: Once | INTRAMUSCULAR | Status: AC
  Administered 2022-10-08: 10 mg via INTRAVENOUS
  Filled 2022-10-07: qty 1

## 2022-10-07 MED ORDER — MIDAZOLAM HCL 2 MG/2ML IJ SOLN
INTRAMUSCULAR | Status: AC
  Filled 2022-10-07: qty 2

## 2022-10-07 MED ORDER — BUPIVACAINE-EPINEPHRINE (PF) 0.5% -1:200000 IJ SOLN
INTRAMUSCULAR | Status: AC
  Filled 2022-10-07: qty 30

## 2022-10-07 MED ORDER — LACTATED RINGERS IV SOLN: INTRAVENOUS | Status: DC

## 2022-10-07 MED ORDER — POLYETHYLENE GLYCOL 3350 17 G PO PACK: 17.0000 g | PACK | Freq: Every day | ORAL | Status: DC | PRN

## 2022-10-07 MED ORDER — IRBESARTAN 150 MG PO TABS
300.0000 mg | ORAL_TABLET | Freq: Every day | ORAL | Status: DC
  Administered 2022-10-08: 300 mg via ORAL
  Filled 2022-10-07: qty 2

## 2022-10-07 MED ORDER — FENTANYL CITRATE (PF) 100 MCG/2ML IJ SOLN
INTRAMUSCULAR | Status: DC | PRN
  Administered 2022-10-07: 25 ug via INTRAVENOUS
  Administered 2022-10-07: 50 ug via INTRAVENOUS

## 2022-10-07 MED ORDER — TRAMADOL HCL 50 MG PO TABS
ORAL_TABLET | ORAL | Status: AC
  Filled 2022-10-07: qty 2

## 2022-10-07 MED ORDER — CEFAZOLIN SODIUM-DEXTROSE 2-4 GM/100ML-% IV SOLN
2.0000 g | Freq: Four times a day (QID) | INTRAVENOUS | Status: AC
  Administered 2022-10-07 (×2): 2 g via INTRAVENOUS
  Filled 2022-10-07 (×2): qty 100

## 2022-10-07 MED ORDER — METOCLOPRAMIDE HCL 5 MG/ML IJ SOLN: 5.0000 mg | Freq: Three times a day (TID) | INTRAMUSCULAR | Status: DC | PRN

## 2022-10-07 MED ORDER — DEXAMETHASONE SODIUM PHOSPHATE 10 MG/ML IJ SOLN
8.0000 mg | Freq: Once | INTRAMUSCULAR | Status: AC
  Administered 2022-10-07: 8 mg via INTRAVENOUS

## 2022-10-07 MED ORDER — ACETAMINOPHEN 325 MG PO TABS: 325.0000 mg | ORAL_TABLET | Freq: Four times a day (QID) | ORAL | Status: DC | PRN

## 2022-10-07 MED ORDER — DEXAMETHASONE SODIUM PHOSPHATE 10 MG/ML IJ SOLN
INTRAMUSCULAR | Status: AC
  Filled 2022-10-07: qty 1

## 2022-10-07 MED ORDER — TRANEXAMIC ACID-NACL 1000-0.7 MG/100ML-% IV SOLN
1000.0000 mg | INTRAVENOUS | Status: AC
  Administered 2022-10-07: 1000 mg via INTRAVENOUS
  Filled 2022-10-07: qty 100

## 2022-10-07 MED ORDER — MENTHOL 3 MG MT LOZG: 1.0000 | LOZENGE | OROMUCOSAL | Status: DC | PRN

## 2022-10-07 MED ORDER — HYDROCODONE-ACETAMINOPHEN 5-325 MG PO TABS
1.0000 | ORAL_TABLET | ORAL | Status: DC | PRN
  Administered 2022-10-08: 2 via ORAL
  Filled 2022-10-07: qty 2

## 2022-10-07 MED ORDER — SODIUM CHLORIDE 0.9 % IV SOLN: INTRAVENOUS | Status: DC

## 2022-10-07 MED ORDER — MAGNESIUM CITRATE PO SOLN: 1.0000 | Freq: Once | ORAL | Status: DC | PRN

## 2022-10-07 MED ORDER — TRAMADOL HCL 50 MG PO TABS
50.0000 mg | ORAL_TABLET | Freq: Four times a day (QID) | ORAL | Status: DC | PRN
  Administered 2022-10-07: 100 mg via ORAL

## 2022-10-07 MED ORDER — DIPHENHYDRAMINE HCL 25 MG PO CAPS
25.0000 mg | ORAL_CAPSULE | Freq: Every day | ORAL | Status: DC
  Administered 2022-10-07: 25 mg via ORAL
  Filled 2022-10-07: qty 1

## 2022-10-07 MED ORDER — LEVOTHYROXINE SODIUM 25 MCG PO TABS
25.0000 ug | ORAL_TABLET | Freq: Every day | ORAL | Status: DC
  Administered 2022-10-08: 25 ug via ORAL
  Filled 2022-10-07: qty 1

## 2022-10-07 MED ORDER — ACETAMINOPHEN 10 MG/ML IV SOLN
1000.0000 mg | Freq: Four times a day (QID) | INTRAVENOUS | Status: DC
  Administered 2022-10-07: 1000 mg via INTRAVENOUS
  Filled 2022-10-07: qty 100

## 2022-10-07 MED ORDER — HYDROCODONE-ACETAMINOPHEN 5-325 MG PO TABS
ORAL_TABLET | ORAL | Status: AC
  Filled 2022-10-07: qty 2

## 2022-10-07 MED ORDER — PHENYLEPHRINE HCL-NACL 20-0.9 MG/250ML-% IV SOLN
INTRAVENOUS | Status: AC
  Filled 2022-10-07: qty 250

## 2022-10-07 MED ORDER — FENTANYL CITRATE (PF) 100 MCG/2ML IJ SOLN
INTRAMUSCULAR | Status: AC
  Filled 2022-10-07: qty 2

## 2022-10-07 MED ORDER — ORAL CARE MOUTH RINSE: 15.0000 mL | Freq: Once | OROMUCOSAL | Status: AC

## 2022-10-07 MED ORDER — EZETIMIBE 10 MG PO TABS
10.0000 mg | ORAL_TABLET | Freq: Every day | ORAL | Status: DC
  Administered 2022-10-08: 10 mg via ORAL
  Filled 2022-10-07: qty 1

## 2022-10-07 MED ORDER — ONDANSETRON HCL 4 MG/2ML IJ SOLN
INTRAMUSCULAR | Status: AC
  Filled 2022-10-07: qty 2

## 2022-10-07 MED ORDER — POVIDONE-IODINE 10 % EX SWAB
2.0000 | Freq: Once | CUTANEOUS | Status: AC
  Administered 2022-10-07: 2 via TOPICAL

## 2022-10-07 MED ORDER — ASPIRIN 325 MG PO TBEC
325.0000 mg | DELAYED_RELEASE_TABLET | Freq: Two times a day (BID) | ORAL | Status: DC
  Administered 2022-10-08: 325 mg via ORAL
  Filled 2022-10-07: qty 1

## 2022-10-07 MED ORDER — ONDANSETRON HCL 4 MG PO TABS: 4.0000 mg | ORAL_TABLET | Freq: Four times a day (QID) | ORAL | Status: DC | PRN

## 2022-10-07 MED ORDER — CEFAZOLIN SODIUM-DEXTROSE 2-4 GM/100ML-% IV SOLN
2.0000 g | INTRAVENOUS | Status: AC
  Administered 2022-10-07: 2 g via INTRAVENOUS
  Filled 2022-10-07: qty 100

## 2022-10-07 MED ORDER — PROPOFOL 10 MG/ML IV BOLUS
INTRAVENOUS | Status: DC | PRN
  Administered 2022-10-07 (×3): 10 mg via INTRAVENOUS

## 2022-10-07 MED ORDER — PHENOL 1.4 % MT LIQD: 1.0000 | OROMUCOSAL | Status: DC | PRN

## 2022-10-07 MED ORDER — HYDROCODONE-ACETAMINOPHEN 7.5-325 MG PO TABS
1.0000 | ORAL_TABLET | ORAL | Status: DC | PRN
  Administered 2022-10-07 – 2022-10-08 (×3): 2 via ORAL
  Filled 2022-10-07 (×4): qty 2

## 2022-10-07 MED ORDER — HYDROCHLOROTHIAZIDE 25 MG PO TABS
25.0000 mg | ORAL_TABLET | Freq: Every day | ORAL | Status: DC
  Administered 2022-10-08: 25 mg via ORAL
  Filled 2022-10-07: qty 1

## 2022-10-07 MED ORDER — ONDANSETRON HCL 4 MG/2ML IJ SOLN
INTRAMUSCULAR | Status: DC | PRN
  Administered 2022-10-07: 4 mg via INTRAVENOUS

## 2022-10-07 MED ORDER — BUPIVACAINE-EPINEPHRINE 0.5% -1:200000 IJ SOLN
INTRAMUSCULAR | Status: DC | PRN
  Administered 2022-10-07: 20 mL

## 2022-10-07 MED ORDER — MORPHINE SULFATE (PF) 2 MG/ML IV SOLN
0.5000 mg | INTRAVENOUS | Status: DC | PRN
  Administered 2022-10-07: 1 mg via INTRAVENOUS
  Filled 2022-10-07: qty 1

## 2022-10-07 MED ORDER — PROPOFOL 500 MG/50ML IV EMUL
INTRAVENOUS | Status: DC | PRN
  Administered 2022-10-07: 50 ug/kg/min via INTRAVENOUS

## 2022-10-07 MED ORDER — BISACODYL 10 MG RE SUPP: 10.0000 mg | Freq: Every day | RECTAL | Status: DC | PRN

## 2022-10-07 MED ORDER — METHOCARBAMOL 500 MG IVPB - SIMPLE MED
500.0000 mg | Freq: Four times a day (QID) | INTRAVENOUS | Status: DC | PRN
  Administered 2022-10-07: 500 mg via INTRAVENOUS
  Filled 2022-10-07: qty 500

## 2022-10-07 MED ORDER — METHOCARBAMOL 500 MG PO TABS
500.0000 mg | ORAL_TABLET | Freq: Four times a day (QID) | ORAL | Status: DC | PRN
  Administered 2022-10-07 – 2022-10-08 (×3): 500 mg via ORAL
  Filled 2022-10-07 (×4): qty 1

## 2022-10-07 MED ORDER — PHENYLEPHRINE HCL-NACL 20-0.9 MG/250ML-% IV SOLN
INTRAVENOUS | Status: DC | PRN
  Administered 2022-10-07: 30 ug/min via INTRAVENOUS

## 2022-10-07 MED ORDER — 0.9 % SODIUM CHLORIDE (POUR BTL) OPTIME
TOPICAL | Status: DC | PRN
  Administered 2022-10-07: 1000 mL

## 2022-10-07 SURGICAL SUPPLY — 42 items
BAG COUNTER SPONGE SURGICOUNT (BAG) IMPLANT
BAG DECANTER FOR FLEXI CONT (MISCELLANEOUS) IMPLANT
BAG ZIPLOCK 12X15 (MISCELLANEOUS) IMPLANT
BLADE SAG 18X100X1.27 (BLADE) ×1 IMPLANT
COVER PERINEAL POST (MISCELLANEOUS) ×1 IMPLANT
COVER SURGICAL LIGHT HANDLE (MISCELLANEOUS) ×1 IMPLANT
CUP ACETBLR 54 OD PINNACLE (Hips) IMPLANT
DRAPE FOOT SWITCH (DRAPES) ×1 IMPLANT
DRAPE STERI IOBAN 125X83 (DRAPES) ×1 IMPLANT
DRAPE U-SHAPE 47X51 STRL (DRAPES) ×2 IMPLANT
DRSG AQUACEL AG ADV 3.5X10 (GAUZE/BANDAGES/DRESSINGS) ×1 IMPLANT
DURAPREP 26ML APPLICATOR (WOUND CARE) ×1 IMPLANT
ELECT REM PT RETURN 15FT ADLT (MISCELLANEOUS) ×1 IMPLANT
GLOVE BIO SURGEON STRL SZ 6.5 (GLOVE) IMPLANT
GLOVE BIO SURGEON STRL SZ7.5 (GLOVE) IMPLANT
GLOVE BIO SURGEON STRL SZ8 (GLOVE) ×1 IMPLANT
GLOVE BIOGEL PI IND STRL 6.5 (GLOVE) IMPLANT
GLOVE BIOGEL PI IND STRL 7.0 (GLOVE) IMPLANT
GLOVE BIOGEL PI IND STRL 8 (GLOVE) ×1 IMPLANT
GOWN STRL REUS W/ TWL LRG LVL3 (GOWN DISPOSABLE) ×1 IMPLANT
GOWN STRL REUS W/ TWL XL LVL3 (GOWN DISPOSABLE) IMPLANT
GOWN STRL REUS W/TWL LRG LVL3 (GOWN DISPOSABLE) ×1
GOWN STRL REUS W/TWL XL LVL3 (GOWN DISPOSABLE)
HEAD CERAMIC DELTA 36 PLUS 1.5 (Hips) IMPLANT
HOLDER FOLEY CATH W/STRAP (MISCELLANEOUS) ×1 IMPLANT
KIT TURNOVER KIT A (KITS) IMPLANT
LINER MARATHON NEUT +4X54X36 (Hips) IMPLANT
MANIFOLD NEPTUNE II (INSTRUMENTS) ×1 IMPLANT
PACK ANTERIOR HIP CUSTOM (KITS) ×1 IMPLANT
PENCIL SMOKE EVACUATOR COATED (MISCELLANEOUS) ×1 IMPLANT
SPIKE FLUID TRANSFER (MISCELLANEOUS) ×1 IMPLANT
STEM FEMORAL SZ 6MM STD ACTIS (Stem) IMPLANT
STRIP CLOSURE SKIN 1/2X4 (GAUZE/BANDAGES/DRESSINGS) ×1 IMPLANT
SUT ETHIBOND NAB CT1 #1 30IN (SUTURE) ×1 IMPLANT
SUT MNCRL AB 4-0 PS2 18 (SUTURE) ×1 IMPLANT
SUT STRATAFIX 0 PDS 27 VIOLET (SUTURE) ×1
SUT VIC AB 2-0 CT1 27 (SUTURE) ×2
SUT VIC AB 2-0 CT1 TAPERPNT 27 (SUTURE) ×2 IMPLANT
SUTURE STRATFX 0 PDS 27 VIOLET (SUTURE) ×1 IMPLANT
TAPE STRIPS DRAPE STRL (GAUZE/BANDAGES/DRESSINGS) IMPLANT
TRAY FOLEY MTR SLVR 16FR STAT (SET/KITS/TRAYS/PACK) ×1 IMPLANT
TUBE SUCTION HIGH CAP CLEAR NV (SUCTIONS) ×1 IMPLANT

## 2022-10-07 NOTE — Op Note (Signed)
OPERATIVE REPORT- TOTAL HIP ARTHROPLASTY   PREOPERATIVE DIAGNOSIS: Osteoarthritis of the Left hip.   POSTOPERATIVE DIAGNOSIS: Osteoarthritis of the Left  hip.   PROCEDURE: Left total hip arthroplasty, anterior approach.   SURGEON: Gaynelle Arabian, MD   ASSISTANT: Theresa Duty, PA-C  ANESTHESIA:  Spinal  ESTIMATED BLOOD LOSS:-400 mL    DRAINS: None  COMPLICATIONS: None   CONDITION: PACU - hemodynamically stable.   BRIEF CLINICAL NOTE: Luke Skinner is a 66 y.o. male who has advanced end-  stage arthritis of their Left  hip with progressively worsening pain and  dysfunction.The patient has failed nonoperative management and presents for  total hip arthroplasty.   PROCEDURE IN DETAIL: After successful administration of spinal  anesthetic, the traction boots for the [facility] bed were placed on both  feet and the patient was placed onto the [facility] bed, boots placed into the leg  holders. The Left hip was then isolated from the perineum with plastic  drapes and prepped and draped in the usual sterile fashion. ASIS and  greater trochanter were marked and a oblique incision was made, starting  at about 1 cm lateral and 2 cm distal to the ASIS and coursing towards  the anterior cortex of the femur. The skin was cut with a 10 blade  through subcutaneous tissue to the level of the fascia overlying the  tensor fascia lata muscle. The fascia was then incised in line with the  incision at the junction of the anterior third and posterior 2/3rd. The  muscle was teased off the fascia and then the interval between the TFL  and the rectus was developed. The Hohmann retractor was then placed at  the top of the femoral neck over the capsule. The vessels overlying the  capsule were cauterized and the fat on top of the capsule was removed.  A Hohmann retractor was then placed anterior underneath the rectus  femoris to give exposure to the entire anterior capsule. A T-shaped  capsulotomy  was performed. The edges were tagged and the femoral head  was identified.       Osteophytes are removed off the superior acetabulum.  The femoral neck was then cut in situ with an oscillating saw. Traction  was then applied to the left lower extremity utilizing the [facility]  traction. The femoral head was then removed. Retractors were placed  around the acetabulum and then circumferential removal of the labrum was  performed. Osteophytes were also removed. Reaming starts at 49 mm to  medialize and  Increased in 2 mm increments to 53 mm. We reamed in  approximately 40 degrees of abduction, 20 degrees anteversion. A 54 mm  pinnacle acetabular shell was then impacted in anatomic position under  fluoroscopic guidance with excellent purchase. We did not need to place  any additional dome screws. A 36 mm neutral + 4 marathon liner was then  placed into the acetabular shell.       The femoral lift was then placed along the lateral aspect of the femur  just distal to the vastus ridge. The leg was  externally rotated and capsule  was stripped off the inferior aspect of the femoral neck down to the  level of the lesser trochanter, this was done with electrocautery. The femur was lifted after this was performed. The  leg was then placed in an extended and adducted position essentially delivering the femur. We also removed the capsule superiorly and the piriformis from the piriformis fossa to  gain excellent exposure of the  proximal femur. Rongeur was used to remove some cancellous bone to get  into the lateral portion of the proximal femur for placement of the  initial starter reamer. The starter broaches was placed  the starter broach  and was shown to go down the center of the canal. Broaching  with the Actis system was then performed starting at size 0  coursing  Up to size 6. A size 6 had excellent torsional and rotational  and axial stability. The trial standard offset neck was then placed  with a 36  + 1.5 trial head. The hip was then reduced. We confirmed that  the stem was in the canal both on AP and lateral x-rays. It also has excellent sizing. The hip was reduced with outstanding stability through full extension and full external rotation.. AP pelvis was taken and the leg lengths were measured and found to be equal. Hip was then dislocated again and the femoral head and neck removed. The  femoral broach was removed. Size 6 Actis stem with a standard offset  neck was then impacted into the femur following native anteversion. Has  excellent purchase in the canal. Excellent torsional and rotational and  axial stability. It is confirmed to be in the canal on AP and lateral  fluoroscopic views. The 36 + 1.5 ceramic head was placed and the hip  reduced with outstanding stability. Again AP pelvis was taken and it  confirmed that the leg lengths were equal. The wound was then copiously  irrigated with saline solution and the capsule reattached and repaired  with Ethibond suture. 30 ml of .25% Bupivicaine was  injected into the capsule and into the edge of the tensor fascia lata as well as subcutaneous tissue. The fascia overlying the tensor fascia lata was then closed with a running #1 V-Loc. Subcu was closed with interrupted 2-0 Vicryl and subcuticular running 4-0 Monocryl. Incision was cleaned  and dried. Steri-Strips and a bulky sterile dressing applied. The patient was awakened and transported to  recovery in stable condition.        Please note that a surgical assistant was a medical necessity for this procedure to perform it in a safe and expeditious manner. Assistant was necessary to provide appropriate retraction of vital neurovascular structures and to prevent femoral fracture and allow for anatomic placement of the prosthesis.  Gaynelle Arabian, M.D.

## 2022-10-07 NOTE — Anesthesia Postprocedure Evaluation (Signed)
Anesthesia Post Note  Patient: Luke Skinner  Procedure(s) Performed: TOTAL HIP ARTHROPLASTY ANTERIOR APPROACH (Left: Hip)     Patient location during evaluation: PACU Anesthesia Type: Spinal Level of consciousness: oriented and awake and alert Pain management: pain level controlled Vital Signs Assessment: post-procedure vital signs reviewed and stable Respiratory status: spontaneous breathing, respiratory function stable and patient connected to nasal cannula oxygen Cardiovascular status: blood pressure returned to baseline and stable Postop Assessment: no headache, no backache and no apparent nausea or vomiting Anesthetic complications: no  No notable events documented.  Last Vitals:  Vitals:   10/07/22 1318 10/07/22 1520  BP: 138/62 (!) 160/91  Pulse: 73 76  Resp: 16 18  Temp: 36.6 C 36.7 C  SpO2: 97% 95%    Last Pain:  Vitals:   10/07/22 1318  TempSrc: Oral  PainSc:                  Ravan Schlemmer L Jody Aguinaga

## 2022-10-07 NOTE — Interval H&P Note (Signed)
History and Physical Interval Note:  10/07/2022 6:26 AM  Luke Skinner  has presented today for surgery, with the diagnosis of left hip osteoarthritis.  The various methods of treatment have been discussed with the patient and family. After consideration of risks, benefits and other options for treatment, the patient has consented to  Procedure(s): TOTAL HIP ARTHROPLASTY ANTERIOR APPROACH (Left) as a surgical intervention.  The patient's history has been reviewed, patient examined, no change in status, stable for surgery.  I have reviewed the patient's chart and labs.  Questions were answered to the patient's satisfaction.     Pilar Plate Caprisha Bridgett

## 2022-10-07 NOTE — Care Plan (Signed)
Ortho Bundle Case Management Note  Patient Details  Name: Angad Nabers Gadea MRN: [id number] Date of Birth: 03/03/1956                  L THA on 10-07-22  DCP: Home with wife  DME: RW ordered through [place]  PT: HEP   DME Arranged:  Walker rolling DME Agency:  [employer]    Additional Comments: Please contact me with any questions of if this plan should need to change.  Marianne Sofia, RN,CCM [facility]  [phone] 10/07/2022, 3:55 PM

## 2022-10-07 NOTE — Anesthesia Procedure Notes (Signed)
Spinal  Patient location during procedure: OR Start time: 10/07/2022 8:00 AM End time: 10/07/2022 8:05 AM Reason for block: surgical anesthesia Staffing Performed: anesthesiologist  Anesthesiologist: Freddrick March, MD Performed by: Freddrick March, MD Authorized by: Freddrick March, MD   Preanesthetic Checklist Completed: patient identified, IV checked, risks and benefits discussed, surgical consent, monitors and equipment checked, pre-op evaluation and timeout performed Spinal Block Patient position: sitting Prep: DuraPrep and site prepped and draped Patient monitoring: cardiac monitor, continuous pulse ox and blood pressure Approach: midline Location: L3-4 Injection technique: single-shot Needle Needle type: Pencan  Needle gauge: 24 G Needle length: 9 cm Assessment Sensory level: T6 Events: CSF return and second provider Additional Notes Functioning IV was confirmed and monitors were applied. Sterile prep and drape, including hand hygiene and sterile gloves were used. The patient was positioned and the spine was prepped. The skin was anesthetized with lidocaine.  Free flow of clear CSF was obtained prior to injecting local anesthetic into the CSF.  The spinal needle aspirated freely following injection.  The needle was carefully withdrawn.  The patient tolerated the procedure well.   1st attempt unsuccessful by CRNA at L3/4 with introducer and 25G Pencan. 2nd attempt successful requiring one pass at L4/5 with introducer and 25G Pencan my MDA.

## 2022-10-07 NOTE — Discharge Instructions (Signed)
°Frank [name], MD °Total Joint Specialist °[facility] °[address] °[place], [place] [address] °[phone] ° °ANTERIOR APPROACH TOTAL HIP REPLACEMENT POSTOPERATIVE DIRECTIONS ° ° ° ° °Hip Rehabilitation, Guidelines Following Surgery  °The results of a hip operation are greatly improved after range of motion and muscle strengthening exercises. Follow all safety measures which are given to protect your hip. If any of these exercises cause increased pain or swelling in your joint, decrease the amount until you are comfortable again. Then slowly increase the exercises. Call your caregiver if you have problems or questions.  ° °BLOOD CLOT PREVENTION °Take a 325 mg Aspirin two times a day for three weeks following surgery. Then take an 81 mg Aspirin once a day for three weeks. Then discontinue Aspirin. °You may resume your vitamins/supplements upon discharge from the hospital. °Do not take any NSAIDs (Advil, Aleve, Ibuprofen, Meloxicam, etc.) until you have discontinued the 325 mg Aspirin. ° °HOME CARE INSTRUCTIONS  °Remove items at home which could result in a fall. This includes throw rugs or furniture in walking pathways.  °ICE to the affected hip as frequently as 20-30 minutes an hour and then as needed for pain and swelling. Continue to use ice on the hip for pain and swelling from surgery. You may notice swelling that will progress down to the foot and ankle. This is normal after surgery. Elevate the leg when you are not up walking on it.   °Continue to use the breathing machine which will help keep your temperature down.  It is common for your temperature to cycle up and down following surgery, especially at night when you are not up moving around and exerting yourself.  The breathing machine keeps your lungs expanded and your temperature down. ° °DIET °You may resume your previous home diet once your are discharged from the hospital. ° °DRESSING / WOUND CARE / SHOWERING °You have  an adhesive waterproof bandage over the incision. Leave this in place until your first follow-up appointment. Once you remove this you will not need to place another bandage.  °You may begin showering 3 days following surgery, but do not submerge the incision under water. ° °ACTIVITY °For the first 3-5 days, it is important to rest and keep the operative leg elevated. You should, as a general rule, rest for 50 minutes and walk/stretch for 10 minutes per hour. After 5 days, you may slowly increase activity as tolerated.  °Perform the exercises you were provided twice a day for about 15-20 minutes each session. Begin these 2 days following surgery. °Walk with your walker as instructed. Use the walker until you are comfortable transitioning to a cane. Walk with the cane in the opposite hand of the operative leg. You may discontinue the cane once you are comfortable and walking steadily. °Avoid periods of inactivity such as sitting longer than an hour when not asleep. This helps prevent blood clots.  °Do not drive a car for 6 weeks or until released by your surgeon.  °Do not drive while taking narcotics. ° °TED HOSE STOCKINGS °Wear the elastic stockings on both legs for three weeks following surgery during the day. You may remove them at night while sleeping. ° °WEIGHT BEARING °Weight bearing as tolerated with assist device (walker, cane, etc) as directed, use it as long as suggested by your surgeon or therapist, typically at least 4-6 weeks. ° °POSTOPERATIVE CONSTIPATION PROTOCOL °Constipation - defined medically as fewer than three stools per week and severe constipation as   less than one stool per week. ° °One of the most common issues patients have following surgery is constipation.  Even if you have a regular bowel pattern at home, your normal regimen is likely to be disrupted due to multiple reasons following surgery.  Combination of anesthesia, postoperative narcotics, change in appetite and fluid intake all can  affect your bowels.  In order to avoid complications following surgery, here are some recommendations in order to help you during your recovery period. ° °Colace (docusate) - Pick up an over-the-counter form of Colace or another stool softener and take twice a day as long as you are requiring postoperative pain medications.  Take with a full glass of water daily.  If you experience loose stools or diarrhea, hold the colace until you stool forms back up.  If your symptoms do not get better within 1 week or if they get worse, check with your doctor. °Dulcolax (bisacodyl) - Pick up over-the-counter and take as directed by the product packaging as needed to assist with the movement of your bowels.  Take with a full glass of water.  Use this product as needed if not relieved by Colace only.  °MiraLax (polyethylene glycol) - Pick up over-the-counter to have on hand.  MiraLax is a solution that will increase the amount of water in your bowels to assist with bowel movements.  Take as directed and can mix with a glass of water, juice, soda, coffee, or tea.  Take if you go more than two days without a movement.Do not use MiraLax more than once per day. Call your doctor if you are still constipated or irregular after using this medication for 7 days in a row. ° °If you continue to have problems with postoperative constipation, please contact the office for further assistance and recommendations.  If you experience "the worst abdominal pain ever" or develop nausea or vomiting, please contact the office immediatly for further recommendations for treatment. ° °ITCHING ° If you experience itching with your medications, try taking only a single pain pill, or even half a pain pill at a time.  You can also use Benadryl over the counter for itching or also to help with sleep.  ° °MEDICATIONS °See your medication summary on the “After Visit Summary” that the nursing staff will review with you prior to discharge.  You may have some home  medications which will be placed on hold until you complete the course of blood thinner medication.  It is important for you to complete the blood thinner medication as prescribed by your surgeon.  Continue your approved medications as instructed at time of discharge. ° °PRECAUTIONS °If you experience chest pain or shortness of breath - call 911 immediately for transfer to the hospital emergency department.  °If you develop a fever greater that 101 F, purulent drainage from wound, increased redness or drainage from wound, foul odor from the wound/dressing, or calf pain - CONTACT YOUR SURGEON.   °                                                °FOLLOW-UP APPOINTMENTS °Make sure you keep all of your appointments after your operation with your surgeon and caregivers. You should call the office at the above phone number and make an appointment for approximately two weeks after the date of your surgery or on the   date instructed by your surgeon outlined in the "After Visit Summary". ° °RANGE OF MOTION AND STRENGTHENING EXERCISES  °These exercises are designed to help you keep full movement of your hip joint. Follow your caregiver's or physical therapist's instructions. Perform all exercises about fifteen times, three times per day or as directed. Exercise both hips, even if you have had only one joint replacement. These exercises can be done on a training (exercise) mat, on the floor, on a table or on a bed. Use whatever works the best and is most comfortable for you. Use music or television while you are exercising so that the exercises are a pleasant break in your day. This will make your life better with the exercises acting as a break in routine you can look forward to.  °Lying on your back, slowly slide your foot toward your buttocks, raising your knee up off the floor. Then slowly slide your foot back down until your leg is straight again.  °Lying on your back spread your legs as far apart as you can without causing  discomfort.  °Lying on your side, raise your upper leg and foot straight up from the floor as far as is comfortable. Slowly lower the leg and repeat.  °Lying on your back, tighten up the muscle in the front of your thigh (quadriceps muscles). You can do this by keeping your leg straight and trying to raise your heel off the floor. This helps strengthen the largest muscle supporting your knee.  °Lying on your back, tighten up the muscles of your buttocks both with the legs straight and with the knee bent at a comfortable angle while keeping your heel on the floor.  ° °POST-OPERATIVE OPIOID TAPER INSTRUCTIONS: °It is important to wean off of your opioid medication as soon as possible. If you do not need pain medication after your surgery it is ok to stop day one. °Opioids include: °Codeine, Hydrocodone(Norco, Vicodin), Oxycodone(Percocet, oxycontin) and hydromorphone amongst others.  °Long term and even short term use of opiods can cause: °Increased pain response °Dependence °Constipation °Depression °Respiratory depression °And more.  °Withdrawal symptoms can include °Flu like symptoms °Nausea, vomiting °And more °Techniques to manage these symptoms °Hydrate well °Eat regular healthy meals °Stay active °Use relaxation techniques(deep breathing, meditating, yoga) °Do Not substitute Alcohol to help with tapering °If you have been on opioids for less than two weeks and do not have pain than it is ok to stop all together.  °Plan to wean off of opioids °This plan should start within one week post op of your joint replacement. °Maintain the same interval or time between taking each dose and first decrease the dose.  °Cut the total daily intake of opioids by one tablet each day °Next start to increase the time between doses. °The last dose that should be eliminated is the evening dose.  ° °IF YOU ARE TRANSFERRED TO A SKILLED REHAB FACILITY °If the patient is transferred to a skilled rehab facility following release from the  hospital, a list of the current medications will be sent to the facility for the patient to continue.  When discharged from the skilled rehab facility, please have the facility set up the patient's Home Health Physical Therapy prior to being released. Also, the skilled facility will be responsible for providing the patient with their medications at time of release from the facility to include their pain medication, the muscle relaxants, and their blood thinner medication. If the patient is still at the rehab facility   at time of the two week follow up appointment, the skilled rehab facility will also need to assist the patient in arranging follow up appointment in our office and any transportation needs. ° °MAKE SURE YOU:  °Understand these instructions.  °Get help right away if you are not doing well or get worse.  ° ° °DENTAL ANTIBIOTICS: ° °In most cases prophylactic antibiotics for Dental procdeures after total joint surgery are not necessary. ° °Exceptions are as follows: ° °1. History of prior total joint infection ° °2. Severely immunocompromised (Organ Transplant, cancer chemotherapy, Rheumatoid biologic °meds such as Humera) ° °3. Poorly controlled diabetes (A1C &gt; 8.0, blood glucose over 200) ° °If you have one of these conditions, contact your surgeon for an antibiotic prescription, prior to your °dental procedure.  ° ° °Pick up stool softner and laxative for home use following surgery while on pain medications. °Do not submerge incision under water. °Please use good hand washing techniques while changing dressing each day. °May shower starting three days after surgery. °Please use a clean towel to pat the incision dry following showers. °Continue to use ice for pain and swelling after surgery. °Do not use any lotions or creams on the incision until instructed by your surgeon. ° °

## 2022-10-07 NOTE — Evaluation (Addendum)
Physical Therapy Evaluation Patient Details Name: Luke Skinner MRN: [id number] DOB: December 30, 1955 Today's Date: 10/07/2022  History of Present Illness  66 yo male s/p L THA-DA 10/07/22. Hx of neuropathy, L TKA, R THA 2018  Clinical Impression  On eval, pt required Min A for bed mobility and Min guard A for transfers/ambulation. Moderate pain with activity. Anticipate pt will progress well during hospital stay. Plan is for d/c home on tomorrow with HEP.        Recommendations for follow up therapy are one component of a multi-disciplinary discharge planning process, led by the attending physician.  Recommendations may be updated based on patient status, additional functional criteria and insurance authorization.  Follow Up Recommendations Follow physician's recommendations for discharge plan and follow up therapies      Assistance Recommended at Discharge Intermittent Supervision/Assistance  Patient can return home with the following  A little help with walking and/or transfers;A little help with bathing/dressing/bathroom;Assistance with cooking/housework;Assist for transportation;Help with stairs or ramp for entrance    Equipment Recommendations Rolling walker (2 wheels)  Recommendations for Other Services       Functional Status Assessment Patient has had a recent decline in their functional status and demonstrates the ability to make significant improvements in function in a reasonable and predictable amount of time.     Precautions / Restrictions Precautions Precautions: Fall Restrictions Weight Bearing Restrictions: No Other Position/Activity Restrictions: WBAT      Mobility  Bed Mobility Overal bed mobility: Needs Assistance Bed Mobility: Supine to Sit     Supine to sit: Min assist, HOB elevated     General bed mobility comments: Assist for L LE off bed. Increased time. Cues provided.    Transfers Overall transfer level: Needs assistance Equipment used:  Rolling walker (2 wheels) Transfers: Sit to/from Stand Sit to Stand: Min guard, From elevated surface           General transfer comment: Min guard  A for safety. Cues for safety, hand placement.    Ambulation/Gait Ambulation/Gait assistance: Min guard Gait Distance (Feet): 75 Feet Assistive device: Rolling walker (2 wheels) Gait Pattern/deviations: Step-through pattern, Decreased stride length       General Gait Details: Min guard A for safety. Slow gait speed. Pt denied dizziness. Tolerated distance fairly well-pain increased  Stairs            Wheelchair Mobility    Modified Rankin (Stroke Patients Only)       Balance Overall balance assessment: Needs assistance         Standing balance support: Bilateral upper extremity supported, Reliant on assistive device for balance, During functional activity Standing balance-Leahy Scale: Fair                               Pertinent Vitals/Pain Pain Assessment Pain Assessment: 0-10 Pain Score: 7  Pain Location: L hip/thigh Pain Descriptors / Indicators: Discomfort, Guarding, Sore, Tightness Pain Intervention(s): Limited activity within patient's tolerance, Monitored during session, Ice applied, Repositioned    Home Living Family/patient expects to be discharged to:: Private residence Living Arrangements: Spouse/significant other Available Help at Discharge: Family Type of Home: House Home Access: Stairs to enter Entrance Stairs-Rails: None Entrance Stairs-Number of Steps: 1 threshold   Home Layout: One level Home Equipment: BSC/3in1 Informed patient/wife that should they decide they want/need a cane, they could purchase one from a medical supply or drugstore.     Prior Function Prior Level  of Function : Independent/Modified Independent                     Hand Dominance        Extremity/Trunk Assessment   Upper Extremity Assessment Upper Extremity Assessment: Overall WFL for  tasks assessed    Lower Extremity Assessment Lower Extremity Assessment: Generalized weakness    Cervical / Trunk Assessment Cervical / Trunk Assessment: Normal  Communication   Communication: No difficulties  Cognition Arousal/Alertness: Awake/alert Behavior During Therapy: WFL for tasks assessed/performed Overall Cognitive Status: Within Functional Limits for tasks assessed                                          General Comments      Exercises     Assessment/Plan    PT Assessment Patient needs continued PT services  PT Problem List Decreased strength;Decreased range of motion;Decreased activity tolerance;Decreased balance;Decreased mobility;Pain;Decreased knowledge of use of DME       PT Treatment Interventions DME instruction;Gait training;Therapeutic exercise;Balance training;Stair training;Functional mobility training;Therapeutic activities;Patient/family education    PT Goals (Current goals can be found in the Care Plan section)  Acute Rehab PT Goals Patient Stated Goal: regain PLOF/independence PT Goal Formulation: With patient/family Time For Goal Achievement: 10/21/22 Potential to Achieve Goals: Good    Frequency 7X/week     Co-evaluation               AM-PAC PT "6 Clicks" Mobility  Outcome Measure Help needed turning from your back to your side while in a flat bed without using bedrails?: A Little Help needed moving from lying on your back to sitting on the side of a flat bed without using bedrails?: A Little Help needed moving to and from a bed to a chair (including a wheelchair)?: A Little Help needed standing up from a chair using your arms (e.g., wheelchair or bedside chair)?: A Little Help needed to walk in hospital room?: A Little Help needed climbing 3-5 steps with a railing? : A Little 6 Click Score: 18    End of Session Equipment Utilized During Treatment: Gait belt Activity Tolerance: Patient tolerated treatment  well;Patient limited by pain Patient left: in chair;with call bell/phone within reach;with family/visitor present   PT Visit Diagnosis: Other abnormalities of gait and mobility (R26.89);Pain Pain - Right/Left: Left Pain - part of body: Hip    Time: 0258-5277 PT Time Calculation (min) (ACUTE ONLY): 19 min   Charges:   PT Evaluation $PT Eval Low Complexity: [place], PT Acute Rehabilitation  Office: [phone]

## 2022-10-07 NOTE — Transfer of Care (Signed)
Immediate Anesthesia Transfer of Care Note  Patient: Luke Skinner  Procedure(s) Performed: TOTAL HIP ARTHROPLASTY ANTERIOR APPROACH (Left: Hip)  Patient Location: PACU  Anesthesia Type:Spinal  Level of Consciousness: awake, drowsy, and responds to stimulation  Airway & Oxygen Therapy: Patient Spontanous Breathing and Patient connected to face mask oxygen  Post-op Assessment: Report given to RN and Post -op Vital signs reviewed and stable  Post vital signs: Reviewed and stable  Last Vitals:  Vitals Value Taken Time  BP 115/59 10/07/22 1007  Temp    Pulse 69 10/07/22 1008  Resp 18 10/07/22 1008  SpO2 100 % 10/07/22 1008  Vitals shown include unvalidated device data.  Last Pain:  Vitals:   10/07/22 0644  TempSrc:   PainSc: 0-No pain         Complications: No notable events documented.

## 2022-10-08 ENCOUNTER — Encounter (HOSPITAL_COMMUNITY): Payer: Self-pay | Admitting: Orthopedic Surgery

## 2022-10-08 ENCOUNTER — Telehealth: Payer: Self-pay | Admitting: Family Medicine

## 2022-10-08 DIAGNOSIS — M1612 Unilateral primary osteoarthritis, left hip: Secondary | ICD-10-CM | POA: Diagnosis not present

## 2022-10-08 DIAGNOSIS — Z96642 Presence of left artificial hip joint: Secondary | ICD-10-CM | POA: Diagnosis not present

## 2022-10-08 LAB — BASIC METABOLIC PANEL
Anion gap: 9 (ref 5–15)
BUN: 18 mg/dL (ref 8–23)
CO2: 26 mmol/L (ref 22–32)
Calcium: 8.9 mg/dL (ref 8.9–10.3)
Chloride: 102 mmol/L (ref 98–111)
Creatinine, Ser: 1.11 mg/dL (ref 0.61–1.24)
GFR, Estimated: >60 mL/min (ref >60)
Glucose, Bld: 134 mg/dL — ABNORMAL HIGH (ref 70–99)
Potassium: 4.4 mmol/L (ref 3.5–5.1)
Sodium: 137 mmol/L (ref 135–145)

## 2022-10-08 LAB — CBC
HCT: 35.6 % — ABNORMAL LOW (ref 39.0–52.0)
Hemoglobin: 11.3 g/dL — ABNORMAL LOW (ref 13.0–17.0)
MCH: 28.4 pg (ref 26.0–34.0)
MCHC: 31.7 g/dL (ref 30.0–36.0)
MCV: 89.4 fL (ref 80.0–100.0)
Platelets: 228 10*3/uL (ref 150–400)
RBC: 3.98 MIL/uL — ABNORMAL LOW (ref 4.22–5.81)
RDW: 12.8 % (ref 11.5–15.5)
WBC: 17.4 10*3/uL — ABNORMAL HIGH (ref 4.0–10.5)
nRBC: 0 % (ref 0.0–0.2)

## 2022-10-08 MED ORDER — TRAMADOL HCL 50 MG PO TABS: 50.0000 mg | ORAL_TABLET | Freq: Four times a day (QID) | ORAL | 0 refills | Status: DC | PRN

## 2022-10-08 MED ORDER — HYDROCODONE-ACETAMINOPHEN 5-325 MG PO TABS: 1.0000 | ORAL_TABLET | Freq: Four times a day (QID) | ORAL | 0 refills | Status: DC | PRN

## 2022-10-08 MED ORDER — ASPIRIN 325 MG PO TBEC: 325.0000 mg | DELAYED_RELEASE_TABLET | Freq: Two times a day (BID) | ORAL | 0 refills | Status: AC

## 2022-10-08 MED ORDER — METHOCARBAMOL 500 MG PO TABS: 500.0000 mg | ORAL_TABLET | Freq: Four times a day (QID) | ORAL | 0 refills | Status: DC | PRN

## 2022-10-08 NOTE — Progress Notes (Signed)
Pt has passed PT and is ready for discharge.  PIV removed.  Wife at bedside.  Pt remains alert and oriented.  AVS given.  Pt able to verbalize understanding of all discharge instructions, including wound care.  All questions answered.  Pt will discharge after lunch as he requested.

## 2022-10-08 NOTE — Plan of Care (Signed)
Problem: Education: Goal: Knowledge of the prescribed therapeutic regimen will improve 10/08/2022 1110 by Ronya Gilcrest L, RN Outcome: Adequate for Discharge 10/08/2022 0751 by Destina Mantei L, RN Outcome: Progressing Goal: Understanding of discharge needs will improve 10/08/2022 1110 by Taniqua Issa L, RN Outcome: Adequate for Discharge 10/08/2022 0751 by Cody Oliger L, RN Outcome: Progressing Goal: Individualized Educational Video(s) 10/08/2022 1110 by Skylyn Slezak L, RN Outcome: Adequate for Discharge 10/08/2022 0751 by Charbel Los L, RN Outcome: Progressing   Problem: Activity: Goal: Ability to avoid complications of mobility impairment will improve 10/08/2022 1110 by Shadavia Dampier L, RN Outcome: Adequate for Discharge 10/08/2022 0751 by Melik Blancett L, RN Outcome: Progressing Goal: Ability to tolerate increased activity will improve 10/08/2022 1110 by Miyana Mordecai L, RN Outcome: Adequate for Discharge 10/08/2022 0751 by Emunah Texidor L, RN Outcome: Progressing   Problem: Clinical Measurements: Goal: Postoperative complications will be avoided or minimized 10/08/2022 1110 by Mcihael Hinderman L, RN Outcome: Adequate for Discharge 10/08/2022 0751 by Micheil Klaus L, RN Outcome: Progressing   Problem: Pain Management: Goal: Pain level will decrease with appropriate interventions 10/08/2022 1110 by Eulogio Requena L, RN Outcome: Adequate for Discharge 10/08/2022 0751 by Zamia Tyminski L, RN Outcome: Progressing   Problem: Skin Integrity: Goal: Will show signs of wound healing 10/08/2022 1110 by Citlalic Norlander L, RN Outcome: Adequate for Discharge 10/08/2022 0751 by Virgia Kelner L, RN Outcome: Progressing   Problem: Education: Goal: Knowledge of General Education information will improve Description: Including pain rating scale, medication(s)/side effects and non-pharmacologic comfort measures 10/08/2022 1110 by Dodger Sinning L, RN Outcome: Adequate for  Discharge 10/08/2022 0751 by Lancer Thurner L, RN Outcome: Progressing   Problem: Health Behavior/Discharge Planning: Goal: Ability to manage health-related needs will improve 10/08/2022 1110 by Milania Haubner L, RN Outcome: Adequate for Discharge 10/08/2022 0751 by Yanett Conkright L, RN Outcome: Progressing   Problem: Clinical Measurements: Goal: Ability to maintain clinical measurements within normal limits will improve 10/08/2022 1110 by Shirl Ludington L, RN Outcome: Adequate for Discharge 10/08/2022 0751 by Nubia Ziesmer L, RN Outcome: Progressing Goal: Will remain free from infection 10/08/2022 1110 by Tafari Humiston L, RN Outcome: Adequate for Discharge 10/08/2022 0751 by Taylen Wendland L, RN Outcome: Progressing Goal: Diagnostic test results will improve 10/08/2022 1110 by Rook Maue L, RN Outcome: Adequate for Discharge 10/08/2022 0751 by Suheyla Mortellaro L, RN Outcome: Progressing Goal: Respiratory complications will improve 10/08/2022 1110 by Shyheem Whitham L, RN Outcome: Adequate for Discharge 10/08/2022 0751 by Bufford Helms L, RN Outcome: Progressing Goal: Cardiovascular complication will be avoided 10/08/2022 1110 by Denyse Fillion L, RN Outcome: Adequate for Discharge 10/08/2022 0751 by Saliyah Gillin L, RN Outcome: Progressing   Problem: Activity: Goal: Risk for activity intolerance will decrease 10/08/2022 1110 by Ronnae Kaser L, RN Outcome: Adequate for Discharge 10/08/2022 0751 by Mivaan Corbitt L, RN Outcome: Progressing   Problem: Nutrition: Goal: Adequate nutrition will be maintained 10/08/2022 1110 by Ercell Perlman L, RN Outcome: Adequate for Discharge 10/08/2022 0751 by Deandrew Hoecker L, RN Outcome: Progressing   Problem: Coping: Goal: Level of anxiety will decrease 10/08/2022 1110 by Hollyann Pablo L, RN Outcome: Adequate for Discharge 10/08/2022 0751 by Yasseen Salls L, RN Outcome: Progressing   Problem: Elimination: Goal: Will not experience  complications related to bowel motility 10/08/2022 1110 by Ruba Outen L, RN Outcome: Adequate for Discharge 10/08/2022 0751 by Delta Pichon L, RN Outcome: Progressing Goal: Will not experience complications related to urinary retention 10/08/2022 1110 by Kamarie Palma L, RN  Outcome: Adequate for Discharge 10/08/2022 0751 by Maurita Havener L, RN Outcome: Progressing   Problem: Pain Managment: Goal: General experience of comfort will improve 10/08/2022 1110 by Sim Choquette L, RN Outcome: Adequate for Discharge 10/08/2022 0751 by Mayela Bullard L, RN Outcome: Progressing   Problem: Safety: Goal: Ability to remain free from injury will improve 10/08/2022 1110 by Larell Baney L, RN Outcome: Adequate for Discharge 10/08/2022 0751 by Ilyaas Musto L, RN Outcome: Progressing   Problem: Skin Integrity: Goal: Risk for impaired skin integrity will decrease 10/08/2022 1110 by Miche Loughridge L, RN Outcome: Adequate for Discharge 10/08/2022 0751 by Robena Ewy L, RN Outcome: Progressing

## 2022-10-08 NOTE — Progress Notes (Signed)
Physical Therapy Treatment Patient Details Name: Luke Skinner MRN: [id number] DOB: 1956-10-17 Today's Date: 10/08/2022   History of Present Illness 66 yo male s/p L THA-DA 10/07/22. Hx of neuropathy, L TKA, R THA 2018    PT Comments    POD # 1 am session Pt AxO x 3 feeling "ready" to D/C to home today.  Assisted OOB to amb in hallway went well.  Then returned to room to perform some TE's following HEP handout.  Instructed on proper tech, freq as well as use of ICE.   Addressed all mobility questions, discussed appropriate activity, educated on use of ICE.  Pt ready for D/C to home.   Recommendations for follow up therapy are one component of a multi-disciplinary discharge planning process, led by the attending physician.  Recommendations may be updated based on patient status, additional functional criteria and insurance authorization.  Follow Up Recommendations  Follow physician's recommendations for discharge plan and follow up therapies     Assistance Recommended at Discharge Intermittent Supervision/Assistance  Patient can return home with the following A little help with walking and/or transfers;A little help with bathing/dressing/bathroom;Assistance with cooking/housework;Assist for transportation;Help with stairs or ramp for entrance   Equipment Recommendations  Rolling walker (2 wheels);BSC/3in1    Recommendations for Other Services       Precautions / Restrictions Precautions Precautions: Fall Restrictions Weight Bearing Restrictions: No Other Position/Activity Restrictions: WBAT     Mobility  Bed Mobility Overal bed mobility: Needs Assistance Bed Mobility: Supine to Sit     Supine to sit: Supervision     General bed mobility comments: demonstarted and instructed how to use a belt to self asist LE off bed    Transfers Overall transfer level: Needs assistance Equipment used: Rolling walker (2 wheels) Transfers: Sit to/from Stand Sit to Stand: From  elevated surface, Supervision           General transfer comment: one VC on safety with turns    Ambulation/Gait Ambulation/Gait assistance: Supervision Gait Distance (Feet): 115 Feet Assistive device: Rolling walker (2 wheels) Gait Pattern/deviations: Step-through pattern, Decreased stride length Gait velocity: decreased     General Gait Details: tolerated a functional distance and presented with good safety cognition.   Stairs Stairs:  (NO stairs to enter home)           Wheelchair Mobility    Modified Rankin (Stroke Patients Only)       Balance                                            Cognition Arousal/Alertness: Awake/alert Behavior During Therapy: WFL for tasks assessed/performed Overall Cognitive Status: Within Functional Limits for tasks assessed                                 General Comments: AxO x3 motivated        Exercises  Total Hip Replacement TE's following HEP Handout 10 reps ankle pumps 05 reps knee presses 05 reps heel slides 05 reps SAQ's 05 reps ABD Instructed how to use a belt loop to assist  Followed by ICE     General Comments        Pertinent Vitals/Pain Pain Assessment Pain Assessment: 0-10 Pain Score: 5  Pain Location: L hip/thigh Pain Descriptors / Indicators: Discomfort, Guarding, Sore, Tightness  Pain Intervention(s): Monitored during session, Premedicated before session, Repositioned, Ice applied    Home Living                          Prior Function            PT Goals (current goals can now be found in the care plan section) Progress towards PT goals: Progressing toward goals    Frequency    7X/week      PT Plan Current plan remains appropriate    Co-evaluation              AM-PAC PT "6 Clicks" Mobility   Outcome Measure  Help needed turning from your back to your side while in a flat bed without using bedrails?: None Help needed moving  from lying on your back to sitting on the side of a flat bed without using bedrails?: None Help needed moving to and from a bed to a chair (including a wheelchair)?: None Help needed standing up from a chair using your arms (e.g., wheelchair or bedside chair)?: None Help needed to walk in hospital room?: None Help needed climbing 3-5 steps with a railing? : A Little 6 Click Score: 23    End of Session Equipment Utilized During Treatment: Gait belt Activity Tolerance: Patient tolerated treatment well Patient left: in chair;with call bell/phone within reach;with family/visitor present Nurse Communication: Mobility status PT Visit Diagnosis: Other abnormalities of gait and mobility (R26.89);Pain Pain - Right/Left: Left Pain - part of body: Hip     Time: 0940-7680 PT Time Calculation (min) (ACUTE ONLY): 28 min  Charges:  $Gait Training: 8-22 mins $Therapeutic Exercise: 8-22 mins                     Rica Koyanagi  PTA [facility] M-F          [phone] Weekend pager [phone]

## 2022-10-08 NOTE — Telephone Encounter (Signed)
Pt was seen on 10-05-2022 at his wife  pamela appt and has had hip surgery and has a boil and would like abx send into  [id number] [place], [place] [address]: [phone] Fax: [phone]

## 2022-10-08 NOTE — TOC Transition Note (Deleted)
Transition of Care [facility]) - CM/SW Discharge Note  Patient Details  Name: Luke Skinner [place] MRN: [id number] Date of Birth: January 18, 1956  [facility] Care [facility] CM/SW Contact:  Sherie Don, LCSW Phone Number: 10/08/2022, 10:12 AM  Clinical Narrative: Patient is expected to discharge home after working with PT. CSW met with patient to confirm discharge plan. Patient will go home with a home exercise program (HEP). Patient has a rolling walker, cane, and raised toilet seat at home so there are no DME needs at this time. TOC signing off.    Final next level of care: Home/Self Care Barriers to Discharge: No Barriers Identified  Patient Goals and CMS Choice Patient states their goals for this hospitalization and ongoing recovery are:: Discharge home with HEP Choice offered to / list presented to : NA  Discharge Plan and Services       DME Arranged: N/A DME Agency: NA  Social Determinants of Health (SDOH) Interventions    Readmission Risk Interventions     No data to display

## 2022-10-08 NOTE — Plan of Care (Signed)

## 2022-10-08 NOTE — Progress Notes (Signed)
   Subjective: 1 Day Post-Op Procedure(s) (LRB): TOTAL HIP ARTHROPLASTY ANTERIOR APPROACH (Left) Patient seen in rounds by Dr. Wynelle Link. Patient is well, and has had no acute complaints or problems. No issues overnight. Denies SOB or chest pain. Denies calf pain. Foley cath removed this AM. Patient reports pain as mild. Worked with physical therapy yesterday and ambulated 75'.   Objective: Vital signs in last 24 hours: Temp:  [97.8 F (36.6 C)-98.2 F (36.8 C)] 98.1 F (36.7 C) (12/21 0448) Pulse Rate:  [57-88] 63 (12/21 0448) Resp:  [12-24] 16 (12/21 0448) BP: (101-160)/(51-91) 129/51 (12/21 0448) SpO2:  [94 %-100 %] 94 % (12/21 0448) Weight:  [108.8 kg] 108.8 kg (12/20 1520)  Intake/Output from previous day:  Intake/Output Summary (Last 24 hours) at 10/08/2022 0715 Last data filed at 10/08/2022 0600 Gross per 24 hour  Intake 3210.92 ml  Output 2675 ml  Net 535.92 ml     Intake/Output this shift: No intake/output data recorded.  Labs: Recent Labs    10/08/22 0329  HGB 11.3*   Recent Labs    10/08/22 0329  WBC 17.4*  RBC 3.98*  HCT 35.6*  PLT 228   Recent Labs    10/08/22 0329  NA 137  K 4.4  CL 102  CO2 26  BUN 18  CREATININE 1.11  GLUCOSE 134*  CALCIUM 8.9   No results for input(s): "LABPT", "INR" in the last 72 hours.  Exam: General - Patient is Alert and Oriented Extremity - Neurologically intact Neurovascular intact Sensation intact distally Dorsiflexion/Plantar flexion intact Dressing - dressing C/D/I Motor Function - intact, moving foot and toes well on exam.  Past Medical History:  Diagnosis Date   Allergy    Arthritis    oa   Herpes stomatitis    fever blisters occasional   History of kidney stones    Hyperlipidemia    Hypertension    Hypothyroidism    Pneumonia 2006 last time   several times    Rash    below both knees for many years uses selsum blue to wash with    Assessment/Plan: 1 Day Post-Op Procedure(s)  (LRB): TOTAL HIP ARTHROPLASTY ANTERIOR APPROACH (Left) Principal Problem:   OA (osteoarthritis) of hip Active Problems:   Primary osteoarthritis of left hip  Estimated body mass index is 34.42 kg/m as calculated from the following:   Height as of this encounter: '5\' 10"'$  (1.778 m).   Weight as of this encounter: 108.8 kg. Advance diet Up with therapy D/C IV fluids  DVT Prophylaxis - Aspirin Weight bearing as tolerated.  Continue physical therapy. Expected discharge home today if meeting patient goals. Will do HEP once discharged. Follow-up in clinic in 2 weeks.  The PDMP database was reviewed today prior to any opioid medications being prescribed to this patient.  R. Jaynie Bream, PA-C Orthopedic Surgery [phone] 10/08/2022, 7:15 AM

## 2022-10-08 NOTE — TOC Transition Note (Signed)
Transition of Care [facility]) - CM/SW Discharge Note  Patient Details  Name: Luke Skinner MRN: [id number] Date of Birth: 1956/07/09  Transition of Care [facility]) CM/SW Contact:  Sherie Don, LCSW Phone Number: 10/08/2022, 10:23 AM  Clinical Narrative: Patient is expected to discharge home after working with PT. CSW met with patient and spouse to review discharge plan and needs. Patient will go home with a home exercise program (HEP). Patient will need a rolling walker, which [employer] delivered to patient's room. Patient also opted to private pay for a 3N1 through [employer]. TOC signing off.  Final next level of care: Home/Self Care Barriers to Discharge: No Barriers Identified  Patient Goals and CMS Choice Patient states their goals for this hospitalization and ongoing recovery are:: Discharge home with HEP Choice offered to / list presented to : Patient  Discharge Plan and Services       DME Arranged: Walker rolling, 3N1 DME Agency: [employer] Representative spoke with at DME Agency: Prearranged in orthopedist's office  Social Determinants of Health (SDOH) Interventions    Readmission Risk Interventions     No data to display

## 2022-10-08 NOTE — Discharge Summary (Signed)
Physician Discharge Summary   Patient ID: Luke Skinner MRN: [id number] DOB/AGE: 11-27-1955 66 y.o.  Admit date: 10/07/2022 Discharge date: 10/08/2022  Primary Diagnosis: Osteoarthritis, left hip   Admission Diagnoses:  Past Medical History:  Diagnosis Date   Allergy    Arthritis    oa   Herpes stomatitis    fever blisters occasional   History of kidney stones    Hyperlipidemia    Hypertension    Hypothyroidism    Pneumonia 2006 last time   several times    Rash    below both knees for many years uses selsum blue to wash with   Discharge Diagnoses:   Principal Problem:   OA (osteoarthritis) of hip Active Problems:   Primary osteoarthritis of left hip  Estimated body mass index is 34.42 kg/m as calculated from the following:   Height as of this encounter: '5\' 10"'$  (1.778 m).   Weight as of this encounter: 108.8 kg.  Procedure:  Procedure(s) (LRB): TOTAL HIP ARTHROPLASTY ANTERIOR APPROACH (Left)   Consults: None  HPI: Luke Skinner is a 66 y.o. male who has advanced end-stage arthritis of their Left  hip with progressively worsening pain and dysfunction.The patient has failed nonoperative management and presents for total hip arthroplasty.   Laboratory Data: Admission on 10/07/2022, Discharged on 10/08/2022  Component Date Value Ref Range Status   WBC 10/08/2022 17.4 (H)  4.0 - 10.5 K/uL Final   RBC 10/08/2022 3.98 (L)  4.22 - 5.81 MIL/uL Final   Hemoglobin 10/08/2022 11.3 (L)  13.0 - 17.0 g/dL Final   HCT 10/08/2022 35.6 (L)  39.0 - 52.0 % Final   MCV 10/08/2022 89.4  80.0 - 100.0 fL Final   MCH 10/08/2022 28.4  26.0 - 34.0 pg Final   MCHC 10/08/2022 31.7  30.0 - 36.0 g/dL Final   RDW 10/08/2022 12.8  11.5 - 15.5 % Final   Platelets 10/08/2022 228  150 - 400 K/uL Final   nRBC 10/08/2022 0.0  0.0 - 0.2 % Final   Performed at [facility], [place] [address] [place], [place] [address]   Sodium 10/08/2022 137  135 - 145 mmol/L Final    Potassium 10/08/2022 4.4  3.5 - 5.1 mmol/L Final   Chloride 10/08/2022 102  98 - 111 mmol/L Final   CO2 10/08/2022 26  22 - 32 mmol/L Final   Glucose, Bld 10/08/2022 134 (H)  70 - 99 mg/dL Final   Glucose reference range applies only to samples taken after fasting for at least 8 hours.   BUN 10/08/2022 18  8 - 23 mg/dL Final   Creatinine, Ser 10/08/2022 1.11  0.61 - 1.24 mg/dL Final   Calcium 10/08/2022 8.9  8.9 - 10.3 mg/dL Final   GFR, Estimated 10/08/2022 >60  >60 mL/min Final   Comment: (NOTE) Calculated using the CKD-EPI Creatinine Equation (2021)    Anion gap 10/08/2022 9  5 - 15 Final   Performed at [facility], [place] [address] [place], [place] [address]  Hospital Outpatient Visit on 09/29/2022  Component Date Value Ref Range Status   MRSA, PCR 09/29/2022 NEGATIVE  NEGATIVE Final   Staphylococcus aureus 09/29/2022 NEGATIVE  NEGATIVE Final   Comment: (NOTE) The Xpert SA Assay (FDA approved for NASAL specimens in patients 76 years of age and older), is one component of a comprehensive surveillance program. It is not intended to diagnose infection nor to guide or monitor treatment. Performed at [facility], [address] [place], [place] [address]    Sodium 09/29/2022 139  135 - 145 mmol/L Final   Potassium 09/29/2022 4.3  3.5 - 5.1 mmol/L Final   Chloride 09/29/2022 105  98 - 111 mmol/L Final   CO2 09/29/2022 28  22 - 32 mmol/L Final   Glucose, Bld 09/29/2022 134 (H)  70 - 99 mg/dL Final   Glucose reference range applies only to samples taken after fasting for at least 8 hours.   BUN 09/29/2022 18  8 - 23 mg/dL Final   Creatinine, Ser 09/29/2022 1.08  0.61 - 1.24 mg/dL Final   Calcium 09/29/2022 9.8  8.9 - 10.3 mg/dL Final   GFR, Estimated 09/29/2022 >60  >60 mL/min Final   Comment: (NOTE) Calculated using the CKD-EPI Creatinine Equation (2021)    Anion gap 09/29/2022 6  5 - 15 Final   Performed at [facility],  [place] [address], [place], [place] [address]   WBC 09/29/2022 7.6  4.0 - 10.5 K/uL Final   RBC 09/29/2022 4.85  4.22 - 5.81 MIL/uL Final   Hemoglobin 09/29/2022 13.5  13.0 - 17.0 g/dL Final   HCT 09/29/2022 42.9  39.0 - 52.0 % Final   MCV 09/29/2022 88.5  80.0 - 100.0 fL Final   MCH 09/29/2022 27.8  26.0 - 34.0 pg Final   MCHC 09/29/2022 31.5  30.0 - 36.0 g/dL Final   RDW 09/29/2022 12.9  11.5 - 15.5 % Final   Platelets 09/29/2022 244  150 - 400 K/uL Final   nRBC 09/29/2022 0.0  0.0 - 0.2 % Final   Performed at [facility], [place] [address], [place], [place] [address]   ABO/RH(D) 09/29/2022 O POS   Final   Antibody Screen 09/29/2022 NEG   Final   Sample Expiration 09/29/2022 10/10/2022,2359   Final   Extend sample reason 09/29/2022    Final                   Value:NO TRANSFUSIONS OR PREGNANCY IN THE PAST 3 MONTHS Performed at [place] [address] [place], [place] [address]      X-Rays:DG Pelvis Portable  Result Date: 10/07/2022 CLINICAL DATA:  Status post THA EXAM: PORTABLE PELVIS 1 VIEWS COMPARISON:  01/12/2022. FINDINGS: Hip prostheses. No acute fracture, dislocation or subluxation. Lumbosacral spondylitic changes. Pelvic ring intact. No osteolytic or osteoblastic changes. Bilateral IMPRESSION: Lumbosacral degenerative changes. Bilateral hip prostheses. No acute osseous abnormalities. Electronically Signed   By: Sammie Bench M.D.   On: 10/07/2022 11:16   DG HIP UNILAT WITH PELVIS 1V LEFT  Result Date: 10/07/2022 CLINICAL DATA:  Intraoperative left anterior hip replacement. EXAM: DG HIP (WITH OR WITHOUT PELVIS) 1V*L* COMPARISON:  CT abdomen and pelvis 10/05/2014, AP bilateral hips 10/21/2016, KUB 01/12/2022 FINDINGS: Images were performed intraoperatively without the presence of a radiologist. Interval placement of total left hip arthroplasty hardware. Redemonstration of total right hip arthroplasty hardware, partially visualized. No  hardware complication is seen. Total fluoroscopy images: 2 Total fluoroscopy time: 8 seconds Total dose: Radiation Exposure Index (as provided by the fluoroscopic device): 1.74 mGy air Kerma Please see intraoperative findings for further detail. IMPRESSION: Intraoperative fluoroscopy for total left hip arthroplasty. Electronically Signed   By: Yvonne Kendall M.D.   On: 10/07/2022 10:28   DG C-Arm 1-60 Min-No Report  Result Date: 10/07/2022 Fluoroscopy was utilized by the requesting physician.  No radiographic interpretation.    EKG: Orders placed or performed during the hospital  encounter of 09/29/22   EKG 12 lead per protocol   EKG 12 lead per protocol     Hospital Course: Luke Skinner is a 66 y.o. who was admitted to [facility]. They were brought to the operating room on 10/07/2022 and underwent Procedure(s): Swain.  Patient tolerated the procedure well and was later transferred to the recovery room and then to the orthopaedic floor for postoperative care. They were given PO and IV analgesics for pain control following their surgery. They were given 24 hours of postoperative antibiotics of  Anti-infectives (From admission, onward)    Start     Dose/Rate Route Frequency Ordered Stop   10/07/22 1415  ceFAZolin (ANCEF) IVPB 2g/100 mL premix        2 g 200 mL/hr over 30 Minutes Intravenous Every 6 hours 10/07/22 1324 10/07/22 2131   10/07/22 0615  ceFAZolin (ANCEF) IVPB 2g/100 mL premix        2 g 200 mL/hr over 30 Minutes Intravenous On call to O.R. 10/07/22 1700 10/07/22 1749      and started on DVT prophylaxis in the form of Aspirin.   PT and OT were ordered for total joint protocol. Discharge planning consulted to help with postop disposition and equipment needs.  Patient had a good night on the evening of surgery. They started to get up OOB with therapy on POD #0. Pt was seen during rounds and was ready to go home pending progress with  therapy. He worked with therapy on POD #1 and was meeting his goals. Pt was discharged to home later that day in stable condition.  Diet: Regular diet Activity: WBAT Follow-up: in 2 weeks Disposition: Home Discharged Condition: stable   Discharge Instructions     Call MD / Call 911   Complete by: As directed    If you experience chest pain or shortness of breath, CALL 911 and be transported to the hospital emergency room.  If you develope a fever above 101 F, pus (white drainage) or increased drainage or redness at the wound, or calf pain, call your surgeon's office.   Change dressing   Complete by: As directed    You have an adhesive waterproof bandage over the incision. Leave this in place until your first follow-up appointment. Once you remove this you will not need to place another bandage.   Constipation Prevention   Complete by: As directed    Drink plenty of fluids.  Prune juice may be helpful.  You may use a stool softener, such as Colace (over the counter) 100 mg twice a day.  Use MiraLax (over the counter) for constipation as needed.   Diet - low sodium heart healthy   Complete by: As directed    Do not sit on low chairs, stoools or toilet seats, as it may be difficult to get up from low surfaces   Complete by: As directed    Driving restrictions   Complete by: As directed    No driving for two weeks   Post-operative opioid taper instructions:   Complete by: As directed    POST-OPERATIVE OPIOID TAPER INSTRUCTIONS: It is important to wean off of your opioid medication as soon as possible. If you do not need pain medication after your surgery it is ok to stop day one. Opioids include: Codeine, Hydrocodone(Norco, Vicodin), Oxycodone(Percocet, oxycontin) and hydromorphone amongst others.  Long term and even short term use of opiods can cause: Increased pain response Dependence Constipation  Depression Respiratory depression And more.  Withdrawal symptoms can include Flu  like symptoms Nausea, vomiting And more Techniques to manage these symptoms Hydrate well Eat regular healthy meals Stay active Use relaxation techniques(deep breathing, meditating, yoga) Do Not substitute Alcohol to help with tapering If you have been on opioids for less than two weeks and do not have pain than it is ok to stop all together.  Plan to wean off of opioids This plan should start within one week post op of your joint replacement. Maintain the same interval or time between taking each dose and first decrease the dose.  Cut the total daily intake of opioids by one tablet each day Next start to increase the time between doses. The last dose that should be eliminated is the evening dose.      TED hose   Complete by: As directed    Use stockings (TED hose) for three weeks on both leg(s).  You may remove them at night for sleeping.   Weight bearing as tolerated   Complete by: As directed       Allergies as of 10/08/2022       Reactions   Simvastatin    Joint pain.     Sulfa Antibiotics Hives        Medication List     STOP taking these medications    HYDROcodone-acetaminophen 10-325 MG tablet Commonly known as: NORCO Replaced by: HYDROcodone-acetaminophen 5-325 MG tablet   meloxicam 15 MG tablet Commonly known as: MOBIC       TAKE these medications    amoxicillin 500 MG tablet Commonly known as: AMOXIL Take 2,000 mg by mouth See admin instructions. Takes before dental procedures   aspirin EC 325 MG tablet Take 1 tablet (325 mg total) by mouth 2 (two) times daily for 20 days. Then take one 81 mg aspirin once a day for three weeks. Then discontinue aspirin.   diphenhydrAMINE 25 MG tablet Commonly known as: BENADRYL Take 25 mg by mouth at bedtime. For sleep.   ezetimibe 10 MG tablet Commonly known as: ZETIA Take 1 tablet (10 mg total) by mouth daily.   hydrochlorothiazide 25 MG tablet Commonly known as: HYDRODIURIL Take 1 tablet (25 mg total)  by mouth daily.   HYDROcodone bit-homatropine 5-1.5 MG/5ML syrup Commonly known as: HYCODAN Take 5 mLs by mouth every 4 (four) hours as needed for cough.   HYDROcodone-acetaminophen 5-325 MG tablet Commonly known as: NORCO/VICODIN Take 1-2 tablets by mouth every 6 (six) hours as needed for severe pain. Replaces: HYDROcodone-acetaminophen 10-325 MG tablet   ketoconazole 2 % cream Commonly known as: NIZORAL Apply 1 application topically 2 (two) times daily. What changed:  when to take this reasons to take this   MENS MULTIVITAMIN PO Take 1 tablet by mouth at bedtime.   methocarbamol 500 MG tablet Commonly known as: ROBAXIN Take 1 tablet (500 mg total) by mouth every 6 (six) hours as needed for muscle spasms.   metoprolol succinate 50 MG 24 hr tablet Commonly known as: TOPROL-XL TAKE 1 TABLET BY MOUTH ONCE DAILY WITH  OR  IMMEDIATELY  FOLLOWING  A  MEAL. What changed:  how much to take how to take this when to take this   mometasone 0.1 % cream Commonly known as: ELOCON Apply topically as needed. What changed:  how much to take when to take this reasons to take this   Potassium 99 MG Tabs Take 99 mg by mouth at bedtime.   Potassium Gluconate 595  MG Caps Take 595 mg by mouth at bedtime.   Probiotic Acidophilus Caps Take 1 capsule by mouth daily.   pyridoxine 100 MG tablet Commonly known as: B-6 Take 100 mg by mouth daily.   sildenafil 100 MG tablet Commonly known as: VIAGRA Take 1 tablet (100 mg total) by mouth daily as needed for erectile dysfunction.   Synthroid 25 MCG tablet Generic drug: levothyroxine Take 1 tablet (25 mcg total) by mouth daily before breakfast.   telmisartan 80 MG tablet Commonly known as: MICARDIS Take 1 tablet (80 mg total) by mouth daily.   traMADol 50 MG tablet Commonly known as: ULTRAM Take 1-2 tablets (50-100 mg total) by mouth every 6 (six) hours as needed for moderate pain. What changed:  how much to take reasons to take  this   valACYclovir 1000 MG tablet Commonly known as: Valtrex Take 1 tablet (1,000 mg total) by mouth 2 (two) times daily as needed (fever bloisters).   VITAMIN B COMPLEX PO Take 1 tablet by mouth daily.   vitamin B-12 500 MCG tablet Commonly known as: CYANOCOBALAMIN Take 500 mcg by mouth daily.   vitamin C 1000 MG tablet Take 1,000 mg by mouth daily.               Discharge Care Instructions  (From admission, onward)           Start     Ordered   10/08/22 0000  Weight bearing as tolerated        10/08/22 0721   10/08/22 0000  Change dressing       Comments: You have an adhesive waterproof bandage over the incision. Leave this in place until your first follow-up appointment. Once you remove this you will not need to place another bandage.   10/08/22 8891            Follow-up Information     Gaynelle Arabian, MD. Go on 10/20/2022.   Specialty: Orthopedic Surgery Why: You are scheduled for first post op appt on Tuesday Jan 2 at 3:45pm. Contact information: [address] [place] [address] [phone]                 Signed: R. Jaynie Bream, PA-C Orthopedic Surgery 10/08/2022, 1:40 PM

## 2022-10-09 ENCOUNTER — Other Ambulatory Visit: Payer: Self-pay

## 2022-10-09 MED ORDER — DOXYCYCLINE HYCLATE 100 MG PO TABS: 100.0000 mg | ORAL_TABLET | Freq: Two times a day (BID) | ORAL | 0 refills | Status: DC

## 2022-10-09 NOTE — Telephone Encounter (Signed)
Pt Rx sent to his pharmacy. Left detailed message on pt voicemail.

## 2022-10-09 NOTE — Telephone Encounter (Signed)
Pt is calling and boil is on his buttock

## 2022-10-09 NOTE — Telephone Encounter (Signed)
Call in Doxycycline 100 mg BID for 10 days  

## 2022-10-28 DIAGNOSIS — M19011 Primary osteoarthritis, right shoulder: Secondary | ICD-10-CM | POA: Diagnosis not present

## 2022-11-02 DIAGNOSIS — Z08 Encounter for follow-up examination after completed treatment for malignant neoplasm: Secondary | ICD-10-CM | POA: Diagnosis not present

## 2022-11-02 DIAGNOSIS — G5623 Lesion of ulnar nerve, bilateral upper limbs: Secondary | ICD-10-CM | POA: Diagnosis not present

## 2022-11-02 DIAGNOSIS — L821 Other seborrheic keratosis: Secondary | ICD-10-CM | POA: Diagnosis not present

## 2022-11-02 DIAGNOSIS — Z85828 Personal history of other malignant neoplasm of skin: Secondary | ICD-10-CM | POA: Diagnosis not present

## 2022-11-02 DIAGNOSIS — L570 Actinic keratosis: Secondary | ICD-10-CM | POA: Diagnosis not present

## 2022-11-02 DIAGNOSIS — G5603 Carpal tunnel syndrome, bilateral upper limbs: Secondary | ICD-10-CM | POA: Diagnosis not present

## 2022-11-02 DIAGNOSIS — L573 Poikiloderma of Civatte: Secondary | ICD-10-CM | POA: Diagnosis not present

## 2022-11-02 DIAGNOSIS — L601 Onycholysis: Secondary | ICD-10-CM | POA: Diagnosis not present

## 2022-11-02 DIAGNOSIS — D225 Melanocytic nevi of trunk: Secondary | ICD-10-CM | POA: Diagnosis not present

## 2022-11-02 DIAGNOSIS — L814 Other melanin hyperpigmentation: Secondary | ICD-10-CM | POA: Diagnosis not present

## 2022-11-02 DIAGNOSIS — L609 Nail disorder, unspecified: Secondary | ICD-10-CM | POA: Diagnosis not present

## 2022-11-05 ENCOUNTER — Encounter: Payer: Self-pay | Admitting: Nurse Practitioner

## 2022-11-05 ENCOUNTER — Ambulatory Visit: Payer: PPO | Admitting: Nurse Practitioner

## 2022-11-05 VITALS — BP 100/64 | HR 73 | Ht 69.0 in | Wt 249.1 lb

## 2022-11-05 DIAGNOSIS — R09A2 Foreign body sensation, throat: Secondary | ICD-10-CM

## 2022-11-05 DIAGNOSIS — R1310 Dysphagia, unspecified: Secondary | ICD-10-CM | POA: Diagnosis not present

## 2022-11-05 NOTE — Progress Notes (Signed)
11/05/2022 Luke Skinner [id number] [birth year]   Chief Complaint: trouble with my throat   History of Present Illness: Luke Skinner is a 67 year old male with a past medical history of hypertension, hyperlipidemia, hypothyroidism, kidney stones and osteoarthritis. 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 He is known by Dr. Henrene Pastor. He presents today for further evaluation regarding throat problems. He describes feeling like something is in his throat which comes and goes which started 10 years ago which has progressively worsened over the past 6 months. He feels like something is in his throat and he hacks and coughs in attempt to achieve relief. Sometimes food and phlegm feels trapped or stuck in a pocket. If he has a lot of sinus drainage and/or cold symptoms his throat symptoms are notably worse. No heartburn or stomach pain. 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 He underwent an EGD by Dr. Deatra Ina in 2001 which showed erosions in the stomach and a nonbleeding duodenal ulcer. He quit smoking cigarettes in 2000. Rare alcohol use. Mother had issues with swallowing. He took an course of Doxycycline for a boil on his buttock one month ago and his swallowing improved for one week after he took it. He sleeps with his head elevated. He does not have sleep apnea. He takes ASA '81mg'$  daily and Meloxicam daily.  His most recent colonoscopy was 02/24/2021 and one 5 mm tubular adenomatous polyp was removed from the transverse colon and diverticulosis in the left colon was noted.  He underwent left hip replacement surgery 10/07/2022. Post op Hg level 11.3. He plans on seeing his PCP for follow up labs.       Latest Ref Rng & Units 10/08/2022    3:29 AM 09/29/2022    9:30 AM 01/14/2022    9:24 AM  CBC  WBC 4.0 - 10.5 K/uL 17.4  7.6  7.8   Hemoglobin 13.0 - 17.0 g/dL 11.3  13.5  13.7   Hematocrit 39.0 - 52.0 % 35.6  42.9   41.0   Platelets 150 - 400 K/uL 228  244  272.0        Latest Ref Rng & Units 10/08/2022    3:29 AM 09/29/2022    9:30 AM 01/14/2022    9:24 AM  CMP  Glucose 70 - 99 mg/dL 134  134  114   BUN 8 - 23 mg/dL '18  18  17   '$ Creatinine 0.61 - 1.24 mg/dL 1.11  1.08  0.95   Sodium 135 - 145 mmol/L 137  139  140   Potassium 3.5 - 5.1 mmol/L 4.4  4.3  4.3   Chloride 98 - 111 mmol/L 102  105  101   CO2 22 - 32 mmol/L '26  28  31   '$ Calcium 8.9 - 10.3 mg/dL 8.9  9.8  10.3   Total Protein 6.0 - 8.3 g/dL   7.3   Total Bilirubin 0.2 - 1.2 mg/dL   0.7   Alkaline Phos 39 - 117 U/L   76   AST 0 - 37 U/L   23   ALT 0 - 53 U/L   36     PAST GI PROCEDURES:  Colonoscopy 02/24/2021:   Colonoscopy 12/09/2010: Sigmoid diverticulosis  Internal hemorrhoids No polyps  EGD 10/192001: Erosions in the antrum  Duodenal ulcer  Current Outpatient Medications on File Prior to Visit  Medication Sig Dispense  Refill   amoxicillin (AMOXIL) 500 MG tablet Take 2,000 mg by mouth See admin instructions. Takes before dental procedures     Ascorbic Acid (VITAMIN C) 1000 MG tablet Take 1,000 mg by mouth daily.     B Complex Vitamins (VITAMIN B COMPLEX PO) Take 1 tablet by mouth daily.     diphenhydrAMINE (BENADRYL) 25 MG tablet Take 25 mg by mouth at bedtime. For sleep.     ezetimibe (ZETIA) 10 MG tablet Take 1 tablet (10 mg total) by mouth daily. 90 tablet 3   hydrochlorothiazide (HYDRODIURIL) 25 MG tablet Take 1 tablet (25 mg total) by mouth daily. 90 tablet 3   HYDROcodone-acetaminophen (NORCO/VICODIN) 5-325 MG tablet Take 1-2 tablets by mouth every 6 (six) hours as needed for severe pain. 42 tablet 0   ketoconazole (NIZORAL) 2 % cream Apply 1 application topically 2 (two) times daily. (Patient taking differently: Apply 1 application  topically 2 (two) times daily as needed for irritation.) 30 g 5   Lactobacillus (PROBIOTIC ACIDOPHILUS) CAPS Take 1 capsule by mouth daily.     methocarbamol (ROBAXIN) 500 MG tablet  Take 1 tablet (500 mg total) by mouth every 6 (six) hours as needed for muscle spasms. 40 tablet 0   metoprolol succinate (TOPROL-XL) 50 MG 24 hr tablet TAKE 1 TABLET BY MOUTH ONCE DAILY WITH  OR  IMMEDIATELY  FOLLOWING  A  MEAL. (Patient taking differently: Take 50 mg by mouth at bedtime. TAKE 1 TABLET BY MOUTH ONCE DAILY WITH  OR  IMMEDIATELY  FOLLOWING  A  MEAL.) 90 tablet 3   mometasone (ELOCON) 0.1 % cream Apply topically as needed. (Patient taking differently: Apply 1 Application topically daily as needed (irritation).) 45 g 11   Multiple Vitamins-Minerals (MENS MULTIVITAMIN PO) Take 1 tablet by mouth at bedtime.     Potassium 99 MG TABS Take 99 mg by mouth at bedtime.     Potassium Gluconate 595 MG CAPS Take 595 mg by mouth at bedtime.     pyridoxine (B-6) 100 MG tablet Take 100 mg by mouth daily.     sildenafil (VIAGRA) 100 MG tablet Take 1 tablet (100 mg total) by mouth daily as needed for erectile dysfunction. 30 tablet 5   telmisartan (MICARDIS) 80 MG tablet Take 1 tablet (80 mg total) by mouth daily. 90 tablet 3   traMADol (ULTRAM) 50 MG tablet Take 1-2 tablets (50-100 mg total) by mouth every 6 (six) hours as needed for moderate pain. 40 tablet 0   valACYclovir (VALTREX) 1000 MG tablet Take 1 tablet (1,000 mg total) by mouth 2 (two) times daily as needed (fever bloisters). 180 tablet 3   vitamin B-12 (CYANOCOBALAMIN) 500 MCG tablet Take 500 mcg by mouth daily.     No current facility-administered medications on file prior to visit.    Allergies  Allergen Reactions   Simvastatin     Joint pain.     Sulfa Antibiotics Hives    Current Medications, Allergies, Past Medical History, Past Surgical History, Family History and Social History were reviewed in [employer] record.  Review of Systems:   Constitutional: Negative for fever, sweats, chills or weight loss.  Respiratory: Negative for shortness of breath.   Cardiovascular: Negative for chest pain,  palpitations and leg swelling.  Gastrointestinal: See HPI.  Musculoskeletal: Negative for back pain or muscle aches.  Neurological: Negative for dizziness, headaches or paresthesias.    Physical Exam: Ht '5\' 9"'$  (1.753 m)   Wt 249 lb 2  oz (113 kg)   BMI 36.79 kg/m  Wt Readings from Last 3 Encounters:  11/05/22 249 lb 2 oz (113 kg)  10/07/22 239 lb 13.8 oz (108.8 kg)  09/29/22 240 lb (108.9 kg)    General: 67 year old male in no acute distress. Head: Normocephalic and atraumatic. Eyes: No scleral icterus. Conjunctiva pink . Ears: Normal auditory acuity. Mouth: Dentition intact. No ulcers or lesions.  Lungs: Clear throughout to auscultation. Heart: Regular rate and rhythm, no murmur. Abdomen: Soft, nontender and nondistended. No masses or hepatomegaly. Normal bowel sounds x 4 quadrants.  Umbilical hernia.  Midline abdominal scar and LUQ feeding tube scar intact. Rectal: Deferred.  Musculoskeletal: Symmetrical with no gross deformities. Extremities: No edema. Neurological: Alert oriented x 4. No focal deficits.  Psychological: Alert and cooperative. 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  EGD in 2001 without documented esophageal deformity. -Barium swallow study with tablet -ENT consult to include laryngoscopy, referral entered -Eventual EGD to rule out reflux/eosinophilic esophagitis or other etiology to explain his symptoms.  Await barium swallow study and ENT consult prior to considering EGD. -Patient does not wish to empirically go on PPI for possible laryngeal reflux at this time  2) Remote history of gastric erosions/duodenal ulcer per EGD in 2001.  On ASA and Meloxicam.  3) History of a tubular adenomatous polyp removed from the  transverse colon per colonoscopy 02/2021. -Next colonoscopy due 02/2028  4) S/P left hip replacement surgery. Post of Hg 11.3. -Diagnosed patient to follow-up with PCP for repeat CBC

## 2022-11-05 NOTE — Patient Instructions (Signed)
We have sent over your referral to ENT.  You have been scheduled for a Barium Esophogram at [facility] (1st floor of the hospital) on 11/12/22 at 9 am. Please arrive 15 minutes prior to your appointment for registration. Make certain not to have anything to eat or drink 3 hours prior to your test. If you need to reschedule for any reason, please contact radiology at [phone] to do so. __________________________________________________________________ A barium swallow is an examination that concentrates on views of the esophagus. This tends to be a double contrast exam (barium and two liquids which, when combined, create a gas to distend the wall of the oesophagus) or single contrast (non-ionic iodine based). The study is usually tailored to your symptoms so a good history is essential. Attention is paid during the study to the form, structure and configuration of the esophagus, looking for functional disorders (such as aspiration, dysphagia, achalasia, motility and reflux) EXAMINATION You may be asked to change into a gown, depending on the type of swallow being performed. A radiologist and radiographer will perform the procedure. The radiologist will advise you of the type of contrast selected for your procedure and direct you during the exam. You will be asked to stand, sit or lie in several different positions and to hold a small amount of fluid in your mouth before being asked to swallow while the imaging is performed .In some instances you may be asked to swallow barium coated marshmallows to assess the motility of a solid food bolus. The exam can be recorded as a digital or video fluoroscopy procedure. POST PROCEDURE It will take 1-2 days for the barium to pass through your system. To facilitate this, it is important, unless otherwise directed, to increase your fluids for the next 24-48hrs and to resume your normal diet.  This test typically takes about 30 minutes to perform.  Due to  recent changes in healthcare laws, you may see the results of your imaging and laboratory studies on MyChart before your provider has had a chance to review them.  We understand that in some cases there may be results that are confusing or concerning to you. Not all laboratory results come back in the same time frame and the provider may be waiting for multiple results in order to interpret others.  Please give [place] 48 hours in order for your provider to thoroughly review all the results before contacting the office for clarification of your results.    Thank you for trusting me with your gastrointestinal care!   Carl Best, CRNP   __________________________________________________________________________________

## 2022-11-05 NOTE — Progress Notes (Signed)
Noted  

## 2022-11-06 ENCOUNTER — Other Ambulatory Visit: Payer: Self-pay

## 2022-11-06 ENCOUNTER — Other Ambulatory Visit: Payer: Self-pay | Admitting: Family Medicine

## 2022-11-06 MED ORDER — VALACYCLOVIR HCL 1 G PO TABS: 1000.0000 mg | ORAL_TABLET | Freq: Two times a day (BID) | ORAL | 3 refills | Status: DC | PRN

## 2022-11-11 DIAGNOSIS — Z5189 Encounter for other specified aftercare: Secondary | ICD-10-CM | POA: Diagnosis not present

## 2022-11-12 ENCOUNTER — Ambulatory Visit (HOSPITAL_COMMUNITY)
Admission: RE | Admit: 2022-11-12 | Discharge: 2022-11-12 | Disposition: A | Discharge status: Home / Self Care | Payer: PPO | Source: Ambulatory Visit | Attending: Nurse Practitioner | Admitting: Nurse Practitioner

## 2022-11-12 DIAGNOSIS — R1310 Dysphagia, unspecified: Secondary | ICD-10-CM | POA: Insufficient documentation

## 2022-11-12 DIAGNOSIS — R09A2 Foreign body sensation, throat: Secondary | ICD-10-CM | POA: Insufficient documentation

## 2022-11-12 DIAGNOSIS — K2289 Other specified disease of esophagus: Secondary | ICD-10-CM | POA: Diagnosis not present

## 2022-11-13 ENCOUNTER — Other Ambulatory Visit: Payer: Self-pay

## 2022-11-13 DIAGNOSIS — R1310 Dysphagia, unspecified: Secondary | ICD-10-CM

## 2022-11-13 DIAGNOSIS — R09A2 Foreign body sensation, throat: Secondary | ICD-10-CM

## 2022-12-03 DIAGNOSIS — Z6836 Body mass index (BMI) 36.0-36.9, adult: Secondary | ICD-10-CM | POA: Diagnosis not present

## 2022-12-03 DIAGNOSIS — J0140 Acute pansinusitis, unspecified: Secondary | ICD-10-CM | POA: Diagnosis not present

## 2022-12-03 DIAGNOSIS — I10 Essential (primary) hypertension: Secondary | ICD-10-CM | POA: Diagnosis not present

## 2022-12-25 DIAGNOSIS — M7062 Trochanteric bursitis, left hip: Secondary | ICD-10-CM | POA: Diagnosis not present

## 2022-12-25 DIAGNOSIS — Z5189 Encounter for other specified aftercare: Secondary | ICD-10-CM | POA: Diagnosis not present

## 2022-12-30 DIAGNOSIS — L570 Actinic keratosis: Secondary | ICD-10-CM | POA: Diagnosis not present

## 2022-12-30 DIAGNOSIS — D492 Neoplasm of unspecified behavior of bone, soft tissue, and skin: Secondary | ICD-10-CM | POA: Diagnosis not present

## 2022-12-30 DIAGNOSIS — D225 Melanocytic nevi of trunk: Secondary | ICD-10-CM | POA: Diagnosis not present

## 2022-12-30 DIAGNOSIS — R58 Hemorrhage, not elsewhere classified: Secondary | ICD-10-CM | POA: Diagnosis not present

## 2022-12-30 DIAGNOSIS — L821 Other seborrheic keratosis: Secondary | ICD-10-CM | POA: Diagnosis not present

## 2022-12-31 ENCOUNTER — Encounter: Payer: Self-pay | Admitting: Internal Medicine

## 2022-12-31 ENCOUNTER — Telehealth: Payer: Self-pay | Admitting: Internal Medicine

## 2022-12-31 ENCOUNTER — Ambulatory Visit (AMBULATORY_SURGERY_CENTER): Payer: PPO | Admitting: Internal Medicine

## 2022-12-31 VITALS — BP 167/87 | HR 59 | Temp 97.5°F | Resp 14 | Ht 69.0 in | Wt 249.0 lb

## 2022-12-31 DIAGNOSIS — E039 Hypothyroidism, unspecified: Secondary | ICD-10-CM | POA: Diagnosis not present

## 2022-12-31 DIAGNOSIS — K253 Acute gastric ulcer without hemorrhage or perforation: Secondary | ICD-10-CM | POA: Diagnosis not present

## 2022-12-31 DIAGNOSIS — R09A2 Foreign body sensation, throat: Secondary | ICD-10-CM

## 2022-12-31 DIAGNOSIS — I10 Essential (primary) hypertension: Secondary | ICD-10-CM | POA: Diagnosis not present

## 2022-12-31 DIAGNOSIS — R1310 Dysphagia, unspecified: Secondary | ICD-10-CM | POA: Diagnosis not present

## 2022-12-31 DIAGNOSIS — K259 Gastric ulcer, unspecified as acute or chronic, without hemorrhage or perforation: Secondary | ICD-10-CM

## 2022-12-31 DIAGNOSIS — K3189 Other diseases of stomach and duodenum: Secondary | ICD-10-CM | POA: Diagnosis not present

## 2022-12-31 DIAGNOSIS — K222 Esophageal obstruction: Secondary | ICD-10-CM | POA: Diagnosis not present

## 2022-12-31 HISTORY — PX: ESOPHAGOGASTRODUODENOSCOPY (EGD) WITH ESOPHAGEAL DILATION: SHX5812

## 2022-12-31 MED ORDER — PANTOPRAZOLE SODIUM 40 MG PO TBEC: 40.0000 mg | DELAYED_RELEASE_TABLET | Freq: Every day | ORAL | 11 refills | Status: DC

## 2022-12-31 MED ORDER — SODIUM CHLORIDE 0.9 % IV SOLN: 500.0000 mL | INTRAVENOUS | Status: DC

## 2022-12-31 NOTE — Progress Notes (Signed)
[id number] - CRNA monitor by Tamela Oddi CRNA. VSS

## 2022-12-31 NOTE — Progress Notes (Signed)
11/05/2022 Luke Skinner [id number] 1956-09-14     Chief Complaint: trouble with my throat    History of Present Illness: Luke Skinner is a 67 year old male with a past medical history of hypertension, hyperlipidemia, hypothyroidism, kidney stones and osteoarthritis. 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 He is known by Dr. Henrene Pastor. He presents today for further evaluation regarding throat problems. He describes feeling like something is in his throat which comes and goes which started 10 years ago which has progressively worsened over the past 6 months. He feels like something is in his throat and he hacks and coughs in attempt to achieve relief. Sometimes food and phlegm feels trapped or stuck in a pocket. If he has a lot of sinus drainage and/or cold symptoms his throat symptoms are notably worse. No heartburn or stomach pain. 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 He underwent an EGD by Dr. Deatra Ina in 2001 which showed erosions in the stomach and a nonbleeding duodenal ulcer. He quit smoking cigarettes in 2000. Rare alcohol use. Mother had issues with swallowing. He took an course of Doxycycline for a boil on his buttock one month ago and his swallowing improved for one week after he took it. He sleeps with his head elevated. He does not have sleep apnea. He takes ASA '81mg'$  daily and Meloxicam daily.  His most recent colonoscopy was 02/24/2021 and one 5 mm tubular adenomatous polyp was removed from the transverse colon and diverticulosis in the left colon was noted.  He underwent left hip replacement surgery 10/07/2022. Post op Hg level 11.3. He plans on seeing his PCP for follow up labs.        Latest Ref Rng & Units 10/08/2022    3:29 AM 09/29/2022    9:30 AM 01/14/2022    9:24 AM  CBC  WBC 4.0 - 10.5 K/uL 17.4  7.6  7.8   Hemoglobin 13.0 - 17.0 g/dL 11.3  13.5  13.7   Hematocrit 39.0 - 52.0 % 35.6  42.9   41.0   Platelets 150 - 400 K/uL 228  244  272.0         Latest Ref Rng & Units 10/08/2022    3:29 AM 09/29/2022    9:30 AM 01/14/2022    9:24 AM  CMP  Glucose 70 - 99 mg/dL 134  134  114   BUN 8 - 23 mg/dL '18  18  17   '$ Creatinine 0.61 - 1.24 mg/dL 1.11  1.08  0.95   Sodium 135 - 145 mmol/L 137  139  140   Potassium 3.5 - 5.1 mmol/L 4.4  4.3  4.3   Chloride 98 - 111 mmol/L 102  105  101   CO2 22 - 32 mmol/L '26  28  31   '$ Calcium 8.9 - 10.3 mg/dL 8.9  9.8  10.3   Total Protein 6.0 - 8.3 g/dL     7.3   Total Bilirubin 0.2 - 1.2 mg/dL     0.7   Alkaline Phos 39 - 117 U/L     76   AST 0 - 37 U/L     23   ALT 0 - 53 U/L     36     PAST GI PROCEDURES:   Colonoscopy 02/24/2021:    Colonoscopy 12/09/2010: Sigmoid diverticulosis  Internal hemorrhoids No polyps   EGD 10/192001: Erosions in the antrum  Duodenal ulcer         Current Outpatient Medications on File Prior to Visit  Medication Sig Dispense Refill   amoxicillin (AMOXIL) 500 MG tablet Take 2,000 mg by mouth See admin instructions. Takes before dental procedures       Ascorbic Acid (VITAMIN C) 1000 MG tablet Take 1,000 mg by mouth daily.       B Complex Vitamins (VITAMIN B COMPLEX PO) Take 1 tablet by mouth daily.       diphenhydrAMINE (BENADRYL) 25 MG tablet Take 25 mg by mouth at bedtime. For sleep.       ezetimibe (ZETIA) 10 MG tablet Take 1 tablet (10 mg total) by mouth daily. 90 tablet 3   hydrochlorothiazide (HYDRODIURIL) 25 MG tablet Take 1 tablet (25 mg total) by mouth daily. 90 tablet 3   HYDROcodone-acetaminophen (NORCO/VICODIN) 5-325 MG tablet Take 1-2 tablets by mouth every 6 (six) hours as needed for severe pain. 42 tablet 0   ketoconazole (NIZORAL) 2 % cream Apply 1 application topically 2 (two) times daily. (Patient taking differently: Apply 1 application  topically 2 (two) times daily as needed for irritation.) 30 g 5   Lactobacillus (PROBIOTIC ACIDOPHILUS) CAPS Take 1 capsule by mouth daily.        methocarbamol (ROBAXIN) 500 MG tablet Take 1 tablet (500 mg total) by mouth every 6 (six) hours as needed for muscle spasms. 40 tablet 0   metoprolol succinate (TOPROL-XL) 50 MG 24 hr tablet TAKE 1 TABLET BY MOUTH ONCE DAILY WITH  OR  IMMEDIATELY  FOLLOWING  A  MEAL. (Patient taking differently: Take 50 mg by mouth at bedtime. TAKE 1 TABLET BY MOUTH ONCE DAILY WITH  OR  IMMEDIATELY  FOLLOWING  A  MEAL.) 90 tablet 3   mometasone (ELOCON) 0.1 % cream Apply topically as needed. (Patient taking differently: Apply 1 Application topically daily as needed (irritation).) 45 g 11   Multiple Vitamins-Minerals (MENS MULTIVITAMIN PO) Take 1 tablet by mouth at bedtime.       Potassium 99 MG TABS Take 99 mg by mouth at bedtime.       Potassium Gluconate 595 MG CAPS Take 595 mg by mouth at bedtime.       pyridoxine (B-6) 100 MG tablet Take 100 mg by mouth daily.       sildenafil (VIAGRA) 100 MG tablet Take 1 tablet (100 mg total) by mouth daily as needed for erectile dysfunction. 30 tablet 5   telmisartan (MICARDIS) 80 MG tablet Take 1 tablet (80 mg total) by mouth daily. 90 tablet 3   traMADol (ULTRAM) 50 MG tablet Take 1-2 tablets (50-100 mg total) by mouth every 6 (six) hours as needed for moderate pain. 40 tablet 0   valACYclovir (VALTREX) 1000 MG tablet Take 1 tablet (1,000 mg total) by mouth 2 (two) times daily as needed (fever bloisters). 180 tablet 3   vitamin B-12 (CYANOCOBALAMIN) 500 MCG tablet Take 500 mcg by mouth daily.        No current facility-administered medications on file prior to visit.           Allergies  Allergen Reactions   Simvastatin        Joint pain.     Sulfa Antibiotics Hives      Current Medications, Allergies, Past Medical History, Past Surgical History, Family History and Social History were reviewed in [employer] record.   Review of Systems:   Constitutional: Negative for fever, sweats, chills or weight loss.  Respiratory: Negative for shortness  of breath.   Cardiovascular: Negative for chest pain, palpitations and leg swelling.  Gastrointestinal: See HPI.  Musculoskeletal: Negative for back pain or muscle aches.  Neurological: Negative for dizziness, headaches or paresthesias.      Physical Exam: Ht '5\' 9"'$  (1.753 m)   Wt 249 lb 2 oz (113 kg)   BMI 36.79 kg/m     Wt Readings from Last 3 Encounters:  11/05/22 249 lb 2 oz (113 kg)  10/07/22 239 lb 13.8 oz (108.8 kg)  09/29/22 240 lb (108.9 kg)    General: 67 year old male in no acute distress. Head: Normocephalic and atraumatic. Eyes: No scleral icterus. Conjunctiva pink . Ears: Normal auditory acuity. Mouth: Dentition intact. No ulcers or lesions.  Lungs: Clear throughout to auscultation. Heart: Regular rate and rhythm, no murmur. Abdomen: Soft, nontender and nondistended. No masses or hepatomegaly. Normal bowel sounds x 4 quadrants.  Umbilical hernia.  Midline abdominal scar and LUQ feeding tube scar intact. Rectal: Deferred.  Musculoskeletal: Symmetrical with no gross deformities. Extremities: No edema. Neurological: Alert oriented x 4. No focal deficits.  Psychological: Alert and cooperative. Normal mood and affect   Assessment and Recommendations:   83) 67 year old male with dysphagia with globus sensation component for the past 10 years has progressively worsened over the past 6 months.  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  EGD in 2001 without documented esophageal deformity. -Barium swallow study with tablet -ENT consult to include laryngoscopy, referral entered -Eventual EGD to rule out reflux/eosinophilic esophagitis or other etiology to explain his symptoms.  Await barium swallow study and ENT consult prior to considering EGD. -Patient does not wish to empirically go on PPI for possible laryngeal reflux at this time   2) Remote history of gastric erosions/duodenal ulcer per EGD in 2001.  On ASA and  Meloxicam.   3) History of a tubular adenomatous polyp removed from the transverse colon per colonoscopy 02/2021. -Next colonoscopy due 02/2028   4) S/P left hip replacement surgery. Post of Hg 11.3. -Diagnosed patient to follow-up with PCP for repeat CBC  Recent H&P as above.  Since that time he had a barium swallow with tablet.  Results below: IMPRESSION: 1. Smooth strictured narrowing of the distal esophagus. The 13 mm barium pill would not pass through this area. Recommend endoscopic evaluation and potential treatment. 2. No hiatal hernia or GE reflux demonstrated.  Upper endoscopy with esophageal dilation

## 2022-12-31 NOTE — Telephone Encounter (Signed)
Patient called said Dr.Perry told him to come back in 6 weeks but first available 6/5...Marland Kitchen please advise

## 2022-12-31 NOTE — Progress Notes (Signed)
Pt resting comfortably. VSS. Airway intact. SBAR complete to RN. All questions answered.   

## 2022-12-31 NOTE — Patient Instructions (Addendum)
-   Post dilation diet. - Continue present medications. - Await pathology results. - PRESCRIBE PANTOPRAZOLE 40 mg daily; #30; 11 refills - Office follow-up with Dr. Henrene Pastor in 6 to 8 weeks   YOU HAD AN ENDOSCOPIC PROCEDURE TODAY AT [facility]:   Refer to the procedure report that was given to you for any specific questions about what was found during the examination.  If the procedure report does not answer your questions, please call your gastroenterologist to clarify.  If you requested that your care partner not be given the details of your procedure findings, then the procedure report has been included in a sealed envelope for you to review at your convenience later.  YOU SHOULD EXPECT: Some feelings of bloating in the abdomen. Passage of more gas than usual.  Walking can help get rid of the air that was put into your GI tract during the procedure and reduce the bloating. If you had a lower endoscopy (such as a colonoscopy or flexible sigmoidoscopy) you may notice spotting of blood in your stool or on the toilet paper. If you underwent a bowel prep for your procedure, you may not have a normal bowel movement for a few days.  Please Note:  You might notice some irritation and congestion in your nose or some drainage.  This is from the oxygen used during your procedure.  There is no need for concern and it should clear up in a day or so.  SYMPTOMS TO REPORT IMMEDIATELY:   Following upper endoscopy (EGD)  Vomiting of blood or coffee ground material  New chest pain or pain under the shoulder blades  Painful or persistently difficult swallowing  New shortness of breath  Fever of 100F or higher  Black, tarry-looking stools  For urgent or emergent issues, a gastroenterologist can be reached at any hour by calling [phone]. Do not use MyChart messaging for urgent concerns.    DIET:  We do recommend a small meal at first, but then you may proceed to your regular diet.   Drink plenty of fluids but you should avoid alcoholic beverages for 24 hours.  ACTIVITY:  You should plan to take it easy for the rest of today and you should NOT DRIVE or use heavy machinery until tomorrow (because of the sedation medicines used during the test).    FOLLOW UP: Our staff will call the number listed on your records the next business day following your procedure.  We will call around 7:15- 8:00 am to check on you and address any questions or concerns that you may have regarding the information given to you following your procedure. If we do not reach you, we will leave a message.     If any biopsies were taken you will be contacted by phone or by letter within the next 1-3 weeks.  Please call us at [phone] if you have not heard about the biopsies in 3 weeks.    SIGNATURES/CONFIDENTIALITY: You and/or your care partner have signed paperwork which will be entered into your electronic medical record.  These signatures attest to the fact that that the information above on your After Visit Summary has been reviewed and is understood.  Full responsibility of the confidentiality of this discharge information lies with you and/or your care-partner.

## 2022-12-31 NOTE — Op Note (Signed)
[facility] Patient Name: Luke Skinner Procedure Date: 12/31/2022 8:07 AM MRN: [id number] Endoscopist: Docia Chuck. Henrene Pastor , MD, [id number] Age: 67 Referring MD:  Date of Birth: 06/06/1956 Gender: Male Account #: [id number] Procedure:                Upper GI endoscopy with balloon dilation of the                            esophagus?"20 mm; with biopsy Indications:              Dysphagia, Abnormal cine-esophagram Medicines:                Monitored Anesthesia Care Procedure:                Pre-Anesthesia Assessment:                           - Prior to the procedure, a History and Physical                            was performed, and patient medications and                            allergies were reviewed. The patient's tolerance of                            previous anesthesia was also reviewed. The risks                            and benefits of the procedure and the sedation                            options and risks were discussed with the patient.                            All questions were answered, and informed consent                            was obtained. Prior Anticoagulants: The patient has                            taken no anticoagulant or antiplatelet agents. ASA                            Grade Assessment: II - A patient with mild systemic                            disease. After reviewing the risks and benefits,                            the patient was deemed in satisfactory condition to                            undergo the procedure.  After obtaining informed consent, the endoscope was                            passed under direct vision. Throughout the                            procedure, the patient's blood pressure, pulse, and                            oxygen saturations were monitored continuously. The                            GIF [id number] was introduced through the                            mouth, and  advanced to the second part of duodenum.                            The upper GI endoscopy was accomplished without                            difficulty. The patient tolerated the procedure                            well. Scope In: Scope Out: Findings:                 The esophagus revealed scarring from prior surgery                            at 25 cm. There was a distal stricture with                            esophagitis and edema. After completing the                            endoscopic survey, A TTS dilator was passed through                            the scope. Dilation with an 18-19-20 mm balloon                            dilator was performed to 20 mm.                           The stomach revealed multiple antral erosions.                            Biopsies were taken with a cold forceps for                            histology. There was a small hiatal hernia. Stomach  was otherwise normal                           The examined duodenum was normal.                           The cardia and gastric fundus were normal on                            retroflexion. Complications:            No immediate complications. Estimated Blood Loss:     Estimated blood loss: none. Impression:               1. Mild narrowing proximal esophagus from prior                            surgery                           2. Distal esophageal stricture and esophagitis                            secondary to GERD                           3. Antral erosions status post biopsy                           4. Otherwise unremarkable exam. Recommendation:           1. Patient has a contact number available for                            emergencies. The signs and symptoms of potential                            delayed complications were discussed with the                            patient. Return to normal activities tomorrow.                            Written discharge  instructions were provided to the                            patient.                           2. Post dilation diet.                           3. Continue present medications.                           4. Await pathology results.                           5. PRESCRIBE PANTOPRAZOLE 40 mg daily; #  30; 11                            refills                           6. Office follow-up with Dr. Henrene Pastor in 6 to 8 weeks Docia Chuck. Henrene Pastor, MD 12/31/2022 9:00:29 AM This report has been signed electronically.

## 2023-01-01 ENCOUNTER — Telehealth: Payer: Self-pay | Admitting: *Deleted

## 2023-01-01 NOTE — Telephone Encounter (Signed)
  Follow up Call-     12/31/2022    7:12 AM 02/24/2021    7:11 AM  Call back number  Post procedure Call Back phone  # [phone]  Permission to leave phone message Yes Yes     Patient questions:  Do you have a fever, pain , or abdominal swelling? No. Pain Score  0 *  Have you tolerated food without any problems? Yes.    Have you been able to return to your normal activities? Yes.    Do you have any questions about your discharge instructions: Diet   No. Medications  No. Follow up visit  No.  Do you have questions or concerns about your Care? No.  Actions: * If pain score is 4 or above: No action needed, pain <4.

## 2023-01-04 ENCOUNTER — Encounter: Payer: Self-pay | Admitting: Internal Medicine

## 2023-01-05 NOTE — Telephone Encounter (Signed)
Pts appt moved to 03/02/23 at 11:40am. Please let pt know about new appt.

## 2023-01-05 NOTE — Telephone Encounter (Signed)
Called patient to let him know about his appt, he said he wont be in town he change his mind and will stay will 6/5 appt .Thanks

## 2023-01-05 NOTE — Telephone Encounter (Signed)
Noted  

## 2023-01-07 DIAGNOSIS — K222 Esophageal obstruction: Secondary | ICD-10-CM | POA: Diagnosis not present

## 2023-01-07 DIAGNOSIS — H2513 Age-related nuclear cataract, bilateral: Secondary | ICD-10-CM | POA: Diagnosis not present

## 2023-01-08 ENCOUNTER — Telehealth: Payer: Self-pay | Admitting: Family Medicine

## 2023-01-08 MED ORDER — METHOCARBAMOL 500 MG PO TABS: 500.0000 mg | ORAL_TABLET | Freq: Four times a day (QID) | ORAL | 5 refills | Status: DC | PRN

## 2023-01-08 NOTE — Telephone Encounter (Signed)
Done

## 2023-01-08 NOTE — Addendum Note (Signed)
Addended by: Alysia Penna A on: 01/08/2023 12:04 PM   Modules accepted: Orders

## 2023-01-08 NOTE — Telephone Encounter (Addendum)
Last refill-10/08/22--40 tabs, 0 refills sent by Jearld Lesch, PA Last OV-08/10/22--Dr. Elease Hashimoto  Next OV-02/09/23

## 2023-01-08 NOTE — Telephone Encounter (Signed)
Pt requesting refill of methocarbamol (ROBAXIN) 500 MG tablet [facility] [address], [place] [id number] Phone: [phone]  Fax: [phone]  Says he is leaving town Sunday and would like this filled prior and requesting a call with an update

## 2023-02-03 DIAGNOSIS — E039 Hypothyroidism, unspecified: Secondary | ICD-10-CM | POA: Diagnosis not present

## 2023-02-03 DIAGNOSIS — I10 Essential (primary) hypertension: Secondary | ICD-10-CM | POA: Diagnosis not present

## 2023-02-03 DIAGNOSIS — E230 Hypopituitarism: Secondary | ICD-10-CM | POA: Diagnosis not present

## 2023-02-04 DIAGNOSIS — M7061 Trochanteric bursitis, right hip: Secondary | ICD-10-CM | POA: Diagnosis not present

## 2023-02-09 ENCOUNTER — Ambulatory Visit (INDEPENDENT_AMBULATORY_CARE_PROVIDER_SITE_OTHER): Payer: PPO | Admitting: Family Medicine

## 2023-02-09 ENCOUNTER — Encounter: Payer: Self-pay | Admitting: Family Medicine

## 2023-02-09 VITALS — BP 122/64 | HR 61 | Temp 98.1°F | Ht 69.0 in | Wt 239.0 lb

## 2023-02-09 DIAGNOSIS — Z0000 Encounter for general adult medical examination without abnormal findings: Secondary | ICD-10-CM

## 2023-02-09 DIAGNOSIS — E039 Hypothyroidism, unspecified: Secondary | ICD-10-CM | POA: Diagnosis not present

## 2023-02-09 LAB — HEPATIC FUNCTION PANEL
ALT: 37 U/L (ref 0–53)
AST: 23 U/L (ref 0–37)
Albumin: 4.1 g/dL (ref 3.5–5.2)
Alkaline Phosphatase: 81 U/L (ref 39–117)
Bilirubin, Direct: 0.1 mg/dL (ref 0.0–0.3)
Total Bilirubin: 0.5 mg/dL (ref 0.2–1.2)
Total Protein: 6.9 g/dL (ref 6.0–8.3)

## 2023-02-09 LAB — CBC WITH DIFFERENTIAL/PLATELET
Basophils Absolute: 0 10*3/uL (ref 0.0–0.1)
Basophils Relative: 0.4 % (ref 0.0–3.0)
Eosinophils Absolute: 0.1 10*3/uL (ref 0.0–0.7)
Eosinophils Relative: 1.5 % (ref 0.0–5.0)
HCT: 41.2 % (ref 39.0–52.0)
Hemoglobin: 13.6 g/dL (ref 13.0–17.0)
Lymphocytes Relative: 22.5 % (ref 12.0–46.0)
Lymphs Abs: 2 10*3/uL (ref 0.7–4.0)
MCHC: 33.1 g/dL (ref 30.0–36.0)
MCV: 83.7 fl (ref 78.0–100.0)
Monocytes Absolute: 0.9 10*3/uL (ref 0.1–1.0)
Monocytes Relative: 10 % (ref 3.0–12.0)
Neutro Abs: 5.9 10*3/uL (ref 1.4–7.7)
Neutrophils Relative %: 65.6 % (ref 43.0–77.0)
Platelets: 271 10*3/uL (ref 150.0–400.0)
RBC: 4.92 Mil/uL (ref 4.22–5.81)
RDW: 14 % (ref 11.5–15.5)
WBC: 9 10*3/uL (ref 4.0–10.5)

## 2023-02-09 LAB — T4, FREE: Free T4: 1.54 ng/dL (ref 0.60–1.60)

## 2023-02-09 LAB — TSH: TSH: <0.01 u[IU]/mL — ABNORMAL LOW (ref 0.35–5.50)

## 2023-02-09 LAB — BASIC METABOLIC PANEL
BUN: 23 mg/dL (ref 6–23)
CO2: 30 mEq/L (ref 19–32)
Calcium: 9.8 mg/dL (ref 8.4–10.5)
Chloride: 101 mEq/L (ref 96–112)
Creatinine, Ser: 1.08 mg/dL (ref 0.40–1.50)
GFR: 71.2 mL/min (ref >60.00)
Glucose, Bld: 106 mg/dL — ABNORMAL HIGH (ref 70–99)
Potassium: 4.5 mEq/L (ref 3.5–5.1)
Sodium: 140 mEq/L (ref 135–145)

## 2023-02-09 LAB — LIPID PANEL
Cholesterol: 161 mg/dL (ref 0–200)
HDL: 46.1 mg/dL (ref >39.00)
LDL Cholesterol: 95 mg/dL (ref 0–99)
NonHDL: 115.15
Total CHOL/HDL Ratio: 3
Triglycerides: 102 mg/dL (ref 0.0–149.0)
VLDL: 20.4 mg/dL (ref 0.0–40.0)

## 2023-02-09 LAB — T3, FREE: T3, Free: 3.6 pg/mL (ref 2.3–4.2)

## 2023-02-09 LAB — PSA: PSA: 3.27 ng/mL (ref 0.10–4.00)

## 2023-02-09 LAB — HEMOGLOBIN A1C: Hgb A1c MFr Bld: 6.5 % (ref 4.6–6.5)

## 2023-02-09 MED ORDER — HYDROCHLOROTHIAZIDE 25 MG PO TABS: 25.0000 mg | ORAL_TABLET | Freq: Every day | ORAL | 3 refills | Status: DC

## 2023-02-09 MED ORDER — VALACYCLOVIR HCL 1 G PO TABS: 1000.0000 mg | ORAL_TABLET | Freq: Two times a day (BID) | ORAL | 3 refills | Status: DC | PRN

## 2023-02-09 MED ORDER — HYDROCODONE-ACETAMINOPHEN 5-325 MG PO TABS: 1.0000 | ORAL_TABLET | Freq: Four times a day (QID) | ORAL | 0 refills | Status: DC | PRN

## 2023-02-09 MED ORDER — EZETIMIBE 10 MG PO TABS: 10.0000 mg | ORAL_TABLET | Freq: Every day | ORAL | 3 refills | Status: DC

## 2023-02-09 MED ORDER — MOMETASONE FUROATE 0.1 % EX CREA: TOPICAL_CREAM | CUTANEOUS | 11 refills | Status: AC | PRN

## 2023-02-09 MED ORDER — TELMISARTAN 80 MG PO TABS: 80.0000 mg | ORAL_TABLET | Freq: Every day | ORAL | 3 refills | Status: DC

## 2023-02-09 MED ORDER — METHOCARBAMOL 500 MG PO TABS: 500.0000 mg | ORAL_TABLET | Freq: Four times a day (QID) | ORAL | 5 refills | Status: DC | PRN

## 2023-02-09 MED ORDER — KETOCONAZOLE 2 % EX CREA: 1.0000 | TOPICAL_CREAM | Freq: Two times a day (BID) | CUTANEOUS | 5 refills | Status: AC

## 2023-02-09 MED ORDER — PANTOPRAZOLE SODIUM 40 MG PO TBEC: 40.0000 mg | DELAYED_RELEASE_TABLET | Freq: Every day | ORAL | 3 refills | Status: DC

## 2023-02-09 MED ORDER — MELOXICAM 15 MG PO TABS: 15.0000 mg | ORAL_TABLET | Freq: Every day | ORAL | 3 refills | Status: DC

## 2023-02-09 MED ORDER — METOPROLOL SUCCINATE ER 50 MG PO TB24: ORAL_TABLET | ORAL | 3 refills | Status: DC

## 2023-02-09 NOTE — Progress Notes (Signed)
   Subjective:    Patient ID: Luke Skinner, male    DOB: [birth year], 67 y.o.   MRN: [id number]  HPI Here for a well exam. He feels fine. He sees Dr. Ocie Cornfield for his hypothyroidism. He sees Urology for prostate exams.    Review of Systems  Constitutional: Negative.   HENT: Negative.    Eyes: Negative.   Respiratory: Negative.    Cardiovascular: Negative.   Gastrointestinal: Negative.   Genitourinary: Negative.   Musculoskeletal: Negative.   Skin: Negative.   Neurological: Negative.   Psychiatric/Behavioral: Negative.         Objective:   Physical Exam Constitutional:      General: He is not in acute distress.    Appearance: Normal appearance. He is well-developed. He is not diaphoretic.  HENT:     Head: Normocephalic and atraumatic.     Right Ear: External ear normal.     Left Ear: External ear normal.     Nose: Nose normal.     Mouth/Throat:     Pharynx: No oropharyngeal exudate.  Eyes:     General: No scleral icterus.       Right eye: No discharge.        Left eye: No discharge.     Conjunctiva/sclera: Conjunctivae normal.     Pupils: Pupils are equal, round, and reactive to light.  Neck:     Thyroid: No thyromegaly.     Vascular: No JVD.     Trachea: No tracheal deviation.  Cardiovascular:     Rate and Rhythm: Normal rate and regular rhythm.     Heart sounds: Normal heart sounds. No murmur heard.    No friction rub. No gallop.  Pulmonary:     Effort: Pulmonary effort is normal. No respiratory distress.     Breath sounds: Normal breath sounds. No wheezing or rales.  Chest:     Chest wall: No tenderness.  Abdominal:     General: Bowel sounds are normal. There is no distension.     Palpations: Abdomen is soft. There is no mass.     Tenderness: There is no abdominal tenderness. There is no guarding or rebound.  Genitourinary:    Penis: No tenderness.   Musculoskeletal:        General: No tenderness. Normal range of motion.     Cervical back: Neck  supple.  Lymphadenopathy:     Cervical: No cervical adenopathy.  Skin:    General: Skin is warm and dry.     Coloration: Skin is not pale.     Findings: No erythema or rash.  Neurological:     Mental Status: He is alert and oriented to person, place, and time.     Cranial Nerves: No cranial nerve deficit.     Motor: No abnormal muscle tone.     Coordination: Coordination normal.     Deep Tendon Reflexes: Reflexes are normal and symmetric. Reflexes normal.  Psychiatric:        Behavior: Behavior normal.        Thought Content: Thought content normal.        Judgment: Judgment normal.           Assessment & Plan:  Well exam. We discussed diet and exercise. Get fasting labs. Gershon Crane, MD

## 2023-02-15 DIAGNOSIS — N200 Calculus of kidney: Secondary | ICD-10-CM | POA: Diagnosis not present

## 2023-02-16 ENCOUNTER — Other Ambulatory Visit: Payer: Self-pay | Admitting: Urology

## 2023-03-02 ENCOUNTER — Ambulatory Visit: Payer: PPO | Admitting: Internal Medicine

## 2023-03-11 ENCOUNTER — Encounter (HOSPITAL_BASED_OUTPATIENT_CLINIC_OR_DEPARTMENT_OTHER): Payer: Self-pay | Admitting: Urology

## 2023-03-11 NOTE — Progress Notes (Signed)
Spoke w/ via phone for pre-op interview--- pt Lab needs dos---- [employer] results------ current EKG in epic/ chart COVID test -----patient states asymptomatic no test needed Arrive at ------- 0730 on 03-19-2023 NPO after MN NO Solid Food.  Clear liquids from MN until--- 0630 Med rec completed Medications to take morning of surgery ----- synthroid Diabetic medication ----- n/a Patient instructed no nail polish to be worn day of surgery Patient instructed to bring photo id and insurance card day of surgery Patient aware to have Driver (ride ) / caregiver    for 24 hours after surgery - wife, pamela Patient Special Instructions ----- n/a Pre-Op special Instructions ----- n/a Patient verbalized understanding of instructions that were given at this phone interview. Patient denies shortness of breath, chest pain, fever, cough at this phone interview.

## 2023-03-17 DIAGNOSIS — L03012 Cellulitis of left finger: Secondary | ICD-10-CM | POA: Diagnosis not present

## 2023-03-17 DIAGNOSIS — L578 Other skin changes due to chronic exposure to nonionizing radiation: Secondary | ICD-10-CM | POA: Diagnosis not present

## 2023-03-17 DIAGNOSIS — L239 Allergic contact dermatitis, unspecified cause: Secondary | ICD-10-CM | POA: Diagnosis not present

## 2023-03-17 DIAGNOSIS — L570 Actinic keratosis: Secondary | ICD-10-CM | POA: Diagnosis not present

## 2023-03-17 DIAGNOSIS — Z86007 Personal history of in-situ neoplasm of skin: Secondary | ICD-10-CM | POA: Diagnosis not present

## 2023-03-17 DIAGNOSIS — Z08 Encounter for follow-up examination after completed treatment for malignant neoplasm: Secondary | ICD-10-CM | POA: Diagnosis not present

## 2023-03-19 ENCOUNTER — Ambulatory Visit (HOSPITAL_BASED_OUTPATIENT_CLINIC_OR_DEPARTMENT_OTHER)
Admission: RE | Admit: 2023-03-19 | Discharge: 2023-03-19 | Disposition: A | Discharge status: Home / Self Care | Payer: PPO | Attending: Urology | Admitting: Urology

## 2023-03-19 ENCOUNTER — Ambulatory Visit (HOSPITAL_BASED_OUTPATIENT_CLINIC_OR_DEPARTMENT_OTHER): Payer: PPO | Admitting: Anesthesiology

## 2023-03-19 ENCOUNTER — Other Ambulatory Visit: Payer: Self-pay

## 2023-03-19 ENCOUNTER — Encounter (HOSPITAL_BASED_OUTPATIENT_CLINIC_OR_DEPARTMENT_OTHER)
Admission: RE | Disposition: A | Discharge status: Home / Self Care | Payer: Self-pay | Source: Home / Self Care | Attending: Urology

## 2023-03-19 DIAGNOSIS — N200 Calculus of kidney: Secondary | ICD-10-CM

## 2023-03-19 DIAGNOSIS — E039 Hypothyroidism, unspecified: Secondary | ICD-10-CM

## 2023-03-19 DIAGNOSIS — I10 Essential (primary) hypertension: Secondary | ICD-10-CM | POA: Insufficient documentation

## 2023-03-19 DIAGNOSIS — Z87891 Personal history of nicotine dependence: Secondary | ICD-10-CM | POA: Insufficient documentation

## 2023-03-19 DIAGNOSIS — Z01818 Encounter for other preprocedural examination: Secondary | ICD-10-CM

## 2023-03-19 DIAGNOSIS — N201 Calculus of ureter: Secondary | ICD-10-CM | POA: Diagnosis not present

## 2023-03-19 HISTORY — DX: Personal history of colonic polyps: Z86.010

## 2023-03-19 HISTORY — DX: Unspecified osteoarthritis, unspecified site: M19.90

## 2023-03-19 HISTORY — DX: Calculus of kidney: N20.0

## 2023-03-19 HISTORY — DX: Mixed hyperlipidemia: E78.2

## 2023-03-19 HISTORY — DX: Diverticulosis of large intestine without perforation or abscess without bleeding: K57.30

## 2023-03-19 HISTORY — PX: CYSTOSCOPY/URETEROSCOPY/HOLMIUM LASER/STENT PLACEMENT: SHX6546

## 2023-03-19 HISTORY — DX: Personal history of other diseases of the digestive system: Z87.19

## 2023-03-19 HISTORY — DX: Gastro-esophageal reflux disease without esophagitis: K21.9

## 2023-03-19 HISTORY — DX: Diaphragmatic hernia without obstruction or gangrene: K44.9

## 2023-03-19 HISTORY — DX: Personal history of adenomatous and serrated colon polyps: Z86.0101

## 2023-03-19 LAB — POCT I-STAT, CHEM 8
BUN: 17 mg/dL (ref 8–23)
Calcium, Ion: 1.24 mmol/L (ref 1.15–1.40)
Chloride: 100 mmol/L (ref 98–111)
Creatinine, Ser: 1 mg/dL (ref 0.61–1.24)
Glucose, Bld: 116 mg/dL — ABNORMAL HIGH (ref 70–99)
HCT: 40 % (ref 39.0–52.0)
Hemoglobin: 13.6 g/dL (ref 13.0–17.0)
Potassium: 4.2 mmol/L (ref 3.5–5.1)
Sodium: 141 mmol/L (ref 135–145)
TCO2: 32 mmol/L (ref 22–32)

## 2023-03-19 SURGERY — CYSTOSCOPY/URETEROSCOPY/HOLMIUM LASER/STENT PLACEMENT
Anesthesia: General | Laterality: Right

## 2023-03-19 MED ORDER — DEXAMETHASONE SODIUM PHOSPHATE 10 MG/ML IJ SOLN
INTRAMUSCULAR | Status: AC
  Filled 2023-03-19: qty 2

## 2023-03-19 MED ORDER — KETOROLAC TROMETHAMINE 15 MG/ML IJ SOLN
15.0000 mg | Freq: Once | INTRAMUSCULAR | Status: AC | PRN
  Administered 2023-03-19: 15 mg via INTRAVENOUS

## 2023-03-19 MED ORDER — PROPOFOL 10 MG/ML IV BOLUS
INTRAVENOUS | Status: DC | PRN
  Administered 2023-03-19: 200 mg via INTRAVENOUS

## 2023-03-19 MED ORDER — 0.9 % SODIUM CHLORIDE (POUR BTL) OPTIME
TOPICAL | Status: DC | PRN
  Administered 2023-03-19: 500 mL

## 2023-03-19 MED ORDER — HYDROCODONE-ACETAMINOPHEN 5-325 MG PO TABS: 1.0000 | ORAL_TABLET | Freq: Four times a day (QID) | ORAL | 0 refills | Status: DC | PRN

## 2023-03-19 MED ORDER — FENTANYL CITRATE (PF) 100 MCG/2ML IJ SOLN
INTRAMUSCULAR | Status: AC
  Filled 2023-03-19: qty 2

## 2023-03-19 MED ORDER — LIDOCAINE 2% (20 MG/ML) 5 ML SYRINGE
INTRAMUSCULAR | Status: DC | PRN
  Administered 2023-03-19: 60 mg via INTRAVENOUS

## 2023-03-19 MED ORDER — CIPROFLOXACIN HCL 500 MG PO TABS: 500.0000 mg | ORAL_TABLET | Freq: Two times a day (BID) | ORAL | 0 refills | Status: DC

## 2023-03-19 MED ORDER — ACETAMINOPHEN 10 MG/ML IV SOLN
INTRAVENOUS | Status: AC
  Filled 2023-03-19: qty 100

## 2023-03-19 MED ORDER — CIPROFLOXACIN IN D5W 400 MG/200ML IV SOLN
INTRAVENOUS | Status: AC
  Filled 2023-03-19: qty 200

## 2023-03-19 MED ORDER — ACETAMINOPHEN 10 MG/ML IV SOLN
1000.0000 mg | Freq: Once | INTRAVENOUS | Status: DC | PRN
  Administered 2023-03-19: 1000 mg via INTRAVENOUS

## 2023-03-19 MED ORDER — CIPROFLOXACIN IN D5W 400 MG/200ML IV SOLN
400.0000 mg | INTRAVENOUS | Status: AC
  Administered 2023-03-19: 400 mg via INTRAVENOUS

## 2023-03-19 MED ORDER — LIDOCAINE HCL URETHRAL/MUCOSAL 2 % EX GEL
CUTANEOUS | Status: DC | PRN
  Administered 2023-03-19: 1 via TOPICAL

## 2023-03-19 MED ORDER — MIDAZOLAM HCL 2 MG/2ML IJ SOLN
INTRAMUSCULAR | Status: AC
  Filled 2023-03-19: qty 2

## 2023-03-19 MED ORDER — IOHEXOL 300 MG/ML  SOLN
INTRAMUSCULAR | Status: DC | PRN
  Administered 2023-03-19: 7 mL

## 2023-03-19 MED ORDER — LIDOCAINE HCL (PF) 2 % IJ SOLN
INTRAMUSCULAR | Status: AC
  Filled 2023-03-19: qty 15

## 2023-03-19 MED ORDER — PROPOFOL 10 MG/ML IV BOLUS
INTRAVENOUS | Status: AC
  Filled 2023-03-19: qty 20

## 2023-03-19 MED ORDER — ONDANSETRON HCL 4 MG/2ML IJ SOLN: 4.0000 mg | Freq: Once | INTRAMUSCULAR | Status: DC | PRN

## 2023-03-19 MED ORDER — MIDAZOLAM HCL 2 MG/2ML IJ SOLN
INTRAMUSCULAR | Status: DC | PRN
  Administered 2023-03-19: 2 mg via INTRAVENOUS

## 2023-03-19 MED ORDER — SUGAMMADEX SODIUM 200 MG/2ML IV SOLN
INTRAVENOUS | Status: DC | PRN
  Administered 2023-03-19: 200 mg via INTRAVENOUS

## 2023-03-19 MED ORDER — ONDANSETRON HCL 4 MG/2ML IJ SOLN
INTRAMUSCULAR | Status: DC | PRN
  Administered 2023-03-19: 4 mg via INTRAVENOUS

## 2023-03-19 MED ORDER — HYDRALAZINE HCL 20 MG/ML IJ SOLN
INTRAMUSCULAR | Status: AC
  Filled 2023-03-19: qty 1

## 2023-03-19 MED ORDER — ROCURONIUM BROMIDE 100 MG/10ML IV SOLN
INTRAVENOUS | Status: DC | PRN
  Administered 2023-03-19: 10 mg via INTRAVENOUS

## 2023-03-19 MED ORDER — KETOROLAC TROMETHAMINE 15 MG/ML IJ SOLN
INTRAMUSCULAR | Status: AC
  Filled 2023-03-19: qty 1

## 2023-03-19 MED ORDER — EPHEDRINE 5 MG/ML INJ
INTRAVENOUS | Status: AC
  Filled 2023-03-19: qty 10

## 2023-03-19 MED ORDER — LACTATED RINGERS IV SOLN: INTRAVENOUS | Status: DC

## 2023-03-19 MED ORDER — SODIUM CHLORIDE 0.9 % IR SOLN
Status: DC | PRN
  Administered 2023-03-19: 3000 mL

## 2023-03-19 MED ORDER — DEXAMETHASONE SODIUM PHOSPHATE 10 MG/ML IJ SOLN
INTRAMUSCULAR | Status: DC | PRN
  Administered 2023-03-19: 5 mg via INTRAVENOUS

## 2023-03-19 MED ORDER — HYDRALAZINE HCL 20 MG/ML IJ SOLN
5.0000 mg | INTRAMUSCULAR | Status: AC
  Administered 2023-03-19 (×3): 5 mg via INTRAVENOUS

## 2023-03-19 MED ORDER — EPHEDRINE SULFATE-NACL 50-0.9 MG/10ML-% IV SOSY
PREFILLED_SYRINGE | INTRAVENOUS | Status: DC | PRN
  Administered 2023-03-19 (×4): 5 mg via INTRAVENOUS

## 2023-03-19 MED ORDER — FENTANYL CITRATE (PF) 250 MCG/5ML IJ SOLN
INTRAMUSCULAR | Status: DC | PRN
  Administered 2023-03-19 (×2): 50 ug via INTRAVENOUS
  Administered 2023-03-19: 25 ug via INTRAVENOUS

## 2023-03-19 MED ORDER — PHENYLEPHRINE 80 MCG/ML (10ML) SYRINGE FOR IV PUSH (FOR BLOOD PRESSURE SUPPORT)
PREFILLED_SYRINGE | INTRAVENOUS | Status: AC
  Filled 2023-03-19: qty 20

## 2023-03-19 MED ORDER — FENTANYL CITRATE (PF) 100 MCG/2ML IJ SOLN
25.0000 ug | INTRAMUSCULAR | Status: DC | PRN
  Administered 2023-03-19 (×2): 25 ug via INTRAVENOUS

## 2023-03-19 MED ORDER — AMISULPRIDE (ANTIEMETIC) 5 MG/2ML IV SOLN: 10.0000 mg | Freq: Once | INTRAVENOUS | Status: DC | PRN

## 2023-03-19 MED ORDER — ONDANSETRON HCL 4 MG/2ML IJ SOLN
INTRAMUSCULAR | Status: AC
  Filled 2023-03-19: qty 8

## 2023-03-19 MED ORDER — ROCURONIUM BROMIDE 10 MG/ML (PF) SYRINGE
PREFILLED_SYRINGE | INTRAVENOUS | Status: AC
  Filled 2023-03-19: qty 10

## 2023-03-19 SURGICAL SUPPLY — 27 items
APL SKNCLS STERI-STRIP NONHPOA (GAUZE/BANDAGES/DRESSINGS) ×1
BAG DRAIN URO-CYSTO SKYTR STRL (DRAIN) ×1 IMPLANT
BAG DRN UROCATH (DRAIN) ×1
BASKET LASER NITINOL 1.9FR (BASKET) IMPLANT
BASKET STONE 1.7 NGAGE (UROLOGICAL SUPPLIES) IMPLANT
BENZOIN TINCTURE PRP APPL 2/3 (GAUZE/BANDAGES/DRESSINGS) IMPLANT
BSKT STON RTRVL 120 1.9FR (BASKET)
CATH URETERAL DUAL LUMEN 10F (MISCELLANEOUS) IMPLANT
CATH URETL OPEN 5X70 (CATHETERS) ×1 IMPLANT
CLOTH BEACON ORANGE TIMEOUT ST (SAFETY) ×1 IMPLANT
DRSG TEGADERM 2-3/8X2-3/4 SM (GAUZE/BANDAGES/DRESSINGS) IMPLANT
EXTRACTOR STONE 1.7FRX115CM (UROLOGICAL SUPPLIES) IMPLANT
GLOVE BIO SURGEON STRL SZ7.5 (GLOVE) ×1 IMPLANT
GOWN STRL REUS W/TWL XL LVL3 (GOWN DISPOSABLE) ×1 IMPLANT
GUIDEWIRE STR DUAL SENSOR (WIRE) ×1 IMPLANT
IV NS IRRIG 3000ML ARTHROMATIC (IV SOLUTION) ×2 IMPLANT
KIT TURNOVER CYSTO (KITS) ×1 IMPLANT
MANIFOLD NEPTUNE II (INSTRUMENTS) ×1 IMPLANT
NS IRRIG 500ML POUR BTL (IV SOLUTION) ×1 IMPLANT
PACK CYSTO (CUSTOM PROCEDURE TRAY) ×1 IMPLANT
SHEATH NAVIGATOR HD 11/13X36 (SHEATH) IMPLANT
SLEEVE SCD COMPRESS KNEE MED (STOCKING) ×1 IMPLANT
STENT URET 6FRX26 CONTOUR (STENTS) IMPLANT
TRACTIP FLEXIVA PULS ID 200XHI (Laser) IMPLANT
TRACTIP FLEXIVA PULSE ID 200 (Laser) ×1
TUBE CONNECTING 12X1/4 (SUCTIONS) IMPLANT
TUBING UROLOGY SET (TUBING) ×1 IMPLANT

## 2023-03-19 NOTE — Discharge Instructions (Addendum)
DISCHARGE INSTRUCTIONS FOR KIDNEY STONE/URETERAL STENT   MEDICATIONS:  1.  Resume all your other meds from home - except do not take any extra narcotic pain meds that you may have at home.  2.  Hydrocodone is for pain, otherwise taking upto 1000 mg every 6 hours of plainTylenol will help treat your pain.   4. Take Cipro one hour prior to removal of your stent.   ACTIVITY:  1. No strenuous activity x 1week  2. No driving while on narcotic pain medications  3. Drink plenty of water  4. Continue to walk at home - you can still get blood clots when you are at home, so keep active, but don't over do it.  5. May return to work/school tomorrow or when you feel ready   BATHING:  1. You can shower and we recommend daily showers  2. You have a string coming from your urethra: The stent string is attached to your ureteral stent. Do not pull on this.   SIGNS/SYMPTOMS TO CALL:  Please call us if you have a fever greater than 101.5, uncontrolled nausea/vomiting, uncontrolled pain, dizziness, unable to urinate, bloody urine, chest pain, shortness of breath, leg swelling, leg pain, redness around wound, drainage from wound, or any other concerns or questions.   You can reach [place] at [phone].   FOLLOW-UP:  1. You have an appointment in 6 weeks with a ultrasound of your kidneys prior.   2. You have a string attached to your stent, you may remove it on Thursday, June 6th. To do this, pull the strings until the stents are completely removed. You may feel an odd sensation in your back.

## 2023-03-19 NOTE — Op Note (Signed)
Preoperative diagnosis: right ureteral calculus  Postoperative diagnosis: right ureteral calculus  Procedure:  Cystoscopy right ureteroscopy and stone removal Ureteroscopic laser lithotripsy right 50F x 26cm ureteral stent placement  right retrograde pyelography with interpretation  Surgeon: Crist Fat, MD  Anesthesia: General  Complications: None  Intraoperative findings: #1 -The patient's right retrograde pyelogram demonstrated normal caliber ureter with some narrowing at the pelvic inlet across the vessels.  There was no appreciable filling defects within the renal pelvis. #2-I was unable to dilate the patient's ureter enough over the pelvic inlet/vessels area to get an access sheath across it.  I was able to get the scope up into the kidney.  I fragmented one of the large stones, and remove those pieces, but then as I went in and out of the ureter and this became increasingly difficult. #3 -the second stone I dusted into small fragments that should be easily passed.  This known was in the upper pole. #4 -there were 2 smaller stones that was easily able to grasp and removed without fragmentation, in total the patient had 4 stones.  EBL: Minimal  Specimens: right ureteral calculus   Indication: Luke Skinner is a 67 y.o.   patient with a known history of nonobstructing stones in the right kidney.  We have been following them for some time now, and the patient now retired and eager to travel.  He was afraid that he would end up passing a stone during one of his trip, and as such opted to remove his stones in a preventative fashion.. After reviewing the management options for treatment, the patient elected to proceed with the above surgical procedure(s). We have discussed the potential benefits and risks of the procedure, side effects of the proposed treatment, the likelihood of the patient achieving the goals of the procedure, and any potential problems that might occur  during the procedure or recuperation. Informed consent has been obtained.   Description of procedure:  The patient was taken to the operating room and general anesthesia was induced.  The patient was placed in the dorsal lithotomy position, prepped and draped in the usual sterile fashion, and preoperative antibiotics were administered. A preoperative time-out was performed.   Cystourethroscopy was performed.  The patient's urethra was examined and was normal demonstrated bilobar prostatic hypertrophy. The bladder was then systematically examined in its entirety. There was no evidence for any bladder tumors, stones, or other mucosal pathology.    Attention then turned to the right ureteral orifice and a ureteral catheter was used to intubate the ureteral orifice.  Omnipaque contrast was injected through the ureteral catheter and a retrograde pyelogram was performed with findings as dictated above.  A 0.38 sensor guidewire was then advanced up the right ureter into the renal pelvis under fluoroscopic guidance.  I then attempted to pass the single-lumen flexible ureteroscope into the patient's right ureteral orifice, but was unable to get it across.  I tried a second wire unsuccessfully to help guide the scope.  I then remove the scope and over the second wire inserted a 11 French inner ureteral access sheath up to the area just distal to the pelvic inlet.  I was unable to get beyond that, the sheath just was bowing.  I then remove that and then inserted both the inner and the outer portion of the access sheath to the same level and remove the inner portion and the wire.  I then advanced a flexible ureteroscope through the access sheath and across  the pelvic inlet.  I was able to get all the way up into the kidney and noted a larger stone in the midpole area and a narrow calyx and a smaller stone in the second midpole calyx.  I removed the smaller stone with an engage basket.  I fragmented the larger stone  into smaller pieces and remove those pieces as well using the same basket.  As I passed across the narrowed area of the ureter it became increasingly difficult to pass it.  Once the stone fragments had all been removed I repassed the scope into the upper pole and encountered the other 2 stones.  One was quite small the other 1 was which bigger.  Given the difficulty passing the fragments through the ureter I opted to dust the remaining portion of the stones.  I dusted these with dusting setting of the laser and ensure that all these pieces were small enough to pass.  I then reinspected the kidney and noted no large stone fragments remaining.  I then slowly backed out the ureteroscope and inspected the ureter noting mild trauma at the pelvic inlet of the ureter.  The access sheath and ureteroscope were subsequently removed.   The wire was then backloaded through the cystoscope and a ureteral stent was advance over the wire using Seldinger technique.  The stent was positioned appropriately under fluoroscopic and cystoscopic guidance.  The wire was then removed with an adequate stent curl noted in the renal pelvis as well as in the bladder.  The bladder was then emptied and the procedure ended.  The patient appeared to tolerate the procedure well and without complications.  The patient was able to be awakened and transferred to the recovery unit in satisfactory condition.   Disposition: The tether of the stent was left on and secured to the ventral aspect of the patient's penis. Instructions for removing the stent have been provided to the patient. The patient has been scheduled for followup in 6 weeks with a renal ultrasound.

## 2023-03-19 NOTE — Anesthesia Procedure Notes (Signed)
Procedure Name: LMA Insertion Date/Time: 03/19/2023 9:36 AM  Performed by: Dairl Ponder, CRNAPre-anesthesia Checklist: Patient identified, Emergency Drugs available, Suction available and Patient being monitored Patient Re-evaluated:Patient Re-evaluated prior to induction Oxygen Delivery Method: Circle System Utilized Preoxygenation: Pre-oxygenation with 100% oxygen Induction Type: IV induction Ventilation: Mask ventilation without difficulty LMA: LMA inserted LMA Size: 5.0 Number of attempts: 1 Airway Equipment and Method: Bite block Placement Confirmation: positive ETCO2 Tube secured with: Tape Dental Injury: Teeth and Oropharynx as per pre-operative assessment

## 2023-03-19 NOTE — Transfer of Care (Signed)
Immediate Anesthesia Transfer of Care Note  Patient: Luke Skinner  Procedure(s) Performed: CYSTOSCOPY RIGHT URETEROSCOPY, HOLMIUM LASER LITHOTRIPSY, RIGHT URETERAL STENT PLACEMENT (Right)  Patient Location: PACU  Anesthesia Type:General  Level of Consciousness: sedated  Airway & Oxygen Therapy: Patient Spontanous Breathing and Patient connected to face mask oxygen  Post-op Assessment: Report given to RN and Post -op Vital signs reviewed and stable  Post vital signs: Reviewed and stable  Last Vitals:  Vitals Value Taken Time  BP 134/62 03/19/23 1123  Temp 36.5 C 03/19/23 1123  Pulse 68 03/19/23 1125  Resp 13 03/19/23 1125  SpO2 100 % 03/19/23 1125  Vitals shown include unvalidated device data.  Last Pain:  Vitals:   03/19/23 0801  TempSrc: Oral  PainSc: 0-No pain         Complications: No notable events documented.

## 2023-03-19 NOTE — H&P (Signed)
Non-obstructing stone follow-up  HPI: Luke Skinner is a 67 year-old male established patient who is here today for interval evaluation of non-obstructing kidney stones.  The patient was last seen March 2023.   The patient has not passed any stones since they were last seen. He has not had any flank pain since in the interval. The patient had 5mm x 2 in right upper and lower pole.   The patient denies any progressive voiding symptoms. He denies dysuria. He does not have hematuria. He has not had fever and chills.   He has not had kidney stone surgery. His last stone surgery was 2015.   The patient has not completed a 24 hour urine collection in the past. The patient is not taking any medications for stone prevention.   The patient underwent KUB prior to today's appointment.   The patient has not had any significant issues including flank pain, hematuria, or renal colic type symptoms. He has not passed any stones that he knows of.   He is trying to drink more fluids, and his wife has encouraged him to drink more lemonade.   Interval: Patient follows up after 1 year. He has had no flank pain, hematuria, or renal colic type symptoms. He has not passed any stones. He had a KUB prior to his appointment today.     ALLERGIES: Dust Mold Statin Drugs Sulfa Drugs Trees    MEDICATIONS: Hydrochlorothiazide 25 mg tablet  Metoprolol Succinate 50 mg tablet, extended release 24 hr  Viagra  Acyclovir  Aleve CAPS Oral  Amoxicillin  Amoxicillin TABS Oral  Benadryl 25 mg capsule  Benadryl TABS Oral  Calcium + D TABS Oral  Clomipramine Hcl 25 mg capsule  Ezelimibe  Ezetimibe 10 mg tablet  Flax Seed Oil CAPS Oral  Hydrocodone-Acetaminophen 10 mg-325 mg tablet  Ibuprofen CAPS Oral  Ketoconazole 2 % cream  Lactobacillus  Levothyroxine Sodium 75 mcg capsule  Losartan-Hydrochlorothiazide 100 mg-25 mg tablet Oral  Meloxicam 15 mg tablet  Metoprolol Tartrate 50 mg tablet Oral  Mometasone  Furoate 0.1 % cream  Multi-Day TABS Oral  Multivitamin 50 Plus  Potassium 595 mg (99 mg) tablet  Robaxin  Robaxin 500 mg tablet  Sildenafil Citrate 100 mg tablet  Sudafed TABS Oral  Synthroid 150 MCG Oral Tablet Oral  Telmisartan 80 mg tablet  Valacyclovir 1,000 mg tablet  Vitamin B Complex  Vitamin C  Vitamin C TABS Oral  Zinc 50 mg tablet     GU PSH: None     PSH Notes: esophagus/gastrostomy as a newborn, Hernia Repair, Sinus Surgery, Left Knee Replacement, Knee Surgery, Gastrostomy Catheter Replacement, Shoulder Surgery, Surgery Of Esophagus, Hip replacement (10/2016)   NON-GU PSH: Deviated Septum Surgery, 1992 Hernia Repair - 2011 Revise Knee Joint - 2011 Shoulder Surgery (Unspecified), Right, 2005     GU PMH: Renal calculus - 01/12/2022, - 2022, - 2019, Renal calculus, right, - 2017 Male ED, unspecified, Impotence, organic - 2015 Elevated PSA, Elevated prostate specific antigen (PSA) - 2015 ED due to arterial insufficiency, Erectile dysfunction due to arterial insufficiency - 2014 Primary hypogonadism, Hypogonadism, testicular - 2014      PMH Notes:  2010-01-31 15:53:06 - Note: Thyroid Disorder  2010-01-31 15:53:06 - Note: Arthritis   NON-GU PMH: Encounter for general adult medical examination without abnormal findings, Encounter for preventive health examination - 2015 Anxiety, Anxiety (Symptom) - 2014 Asthma, Asthma - 2014 Personal history of other diseases of the circulatory system, History of hypertension - 2014 Personal history of  other endocrine, nutritional and metabolic disease, History of hypercholesterolemia - 2014 Personal history of other mental and behavioral disorders, History of depression - 2014 Arthritis Hypercholesterolemia Hypertension Hypothyroidism    FAMILY HISTORY: Family Health Status - Mother's Age - Runs In Family Family Health Status Number - No Family History Father Deceased At Age25 ___ - Runs In Family Heart Disease -  Mother Parkinson's Disease - Father pneumonia - Father    Notes: 1 step son   SOCIAL HISTORY: Marital Status: Married Preferred Language: English; Ethnicity: Not Hispanic Or Latino; Race: White Current Smoking Status: Patient does not smoke anymore. Has not smoked since 12/18/1998. Smoked for 25 years. Smoked 1/2 pack per day.   Tobacco Use Assessment Completed: Used Tobacco in last 30 days? Has never drank.  Drinks 3 caffeinated drinks per day. Patient's occupation is/was Retired.     Notes: Former smoker, Tobacco Use, Occupation:, Marital History - Currently Married, Alcohol Use, Caffeine Use   REVIEW OF SYSTEMS:    GU Review Male:   Patient denies frequent urination, hard to postpone urination, burning/ pain with urination, get up at night to urinate, leakage of urine, stream starts and stops, trouble starting your stream, have to strain to urinate , erection problems, and penile pain.  Gastrointestinal (Upper):   Patient denies vomiting, indigestion/ heartburn, and nausea.  Gastrointestinal (Lower):   Patient denies diarrhea and constipation.  Constitutional:   Patient denies fever, night sweats, weight loss, and fatigue.  Skin:   Patient denies skin rash/ lesion and itching.  Eyes:   Patient denies blurred vision and double vision.  Ears/ Nose/ Throat:   Patient denies sore throat and sinus problems.  Hematologic/Lymphatic:   Patient denies swollen glands and easy bruising.  Cardiovascular:   Patient denies leg swelling and chest pains.  Respiratory:   Patient denies cough and shortness of breath.  Endocrine:   Patient denies excessive thirst.  Musculoskeletal:   Patient denies back pain and joint pain.  Neurological:   Patient denies headaches and dizziness.  Psychologic:   Patient denies depression and anxiety.   VITAL SIGNS:      02/15/2023 08:32 AM  Weight 234 lb / 106.14 kg  BP 131/83 mmHg  Pulse 61 /min   MULTI-SYSTEM PHYSICAL EXAMINATION:    Constitutional:  Well-nourished. No physical deformities. Normally developed. Good grooming.  Respiratory: Normal breath sounds. No labored breathing, no use of accessory muscles.   Cardiovascular: Regular rate and rhythm. No murmur, no gallop. Normal temperature, normal extremity pulses, no swelling, no varicosities.      Complexity of Data:  Source Of History:  Patient  Records Review:   Previous Doctor Records, Previous Patient Records, POC Tool  Urine Test Review:   Urinalysis  X-Ray Review: KUB: Reviewed Films. Discussed With Patient.     01/17/14 08/01/13 01/31/10  PSA  Total PSA 1.63  2.30  3.71     02/01/10  Hormones  Testosterone, Total 395.0     PROCEDURES:         KUB - 74018  A single view of the abdomen is obtained. Renal shadows are easily visualized bilaterally. 2 stones in the right renal pelvis There are no additional calcifications along the expected location of either ureter bilaterally.  Gas pattern is grossly normal. No significant bony abnormalities.      Patient has 2 stones in the right renal pelvis which are nonobstructing and appear relatively unchanged over the course of the last year.  Visit Complexity - G2211          Urinalysis Dipstick Dipstick Cont'd  Color: Yellow Bilirubin: Neg mg/dL  Appearance: Clear Ketones: Neg mg/dL  Specific Gravity: 8.657 Blood: Neg ery/uL  pH: 5.5 Protein: Trace mg/dL  Glucose: Neg mg/dL Urobilinogen: 0.2 mg/dL    Nitrites: Neg    Leukocyte Esterase: Neg leu/uL    ASSESSMENT:      ICD-10 Details  1 GU:   Renal calculus - N20.0    PLAN:           Document Letter(s):  Created for Patient: Clinical Summary         Notes:   The patient's done well over the past year having not passed any stones. The stones look relatively unchanged. He does travel quite a bit, is concerned that he may develop renal colic and passed a stone while on a cruise. He like to get the stones treated as a result. We discussed management  strategies and I recommended ureteroscopy given these got more than 1 stone. I went through the surgery with him including the risk and associated benefits. Having gone through all of that he is opted to proceed. Will get him scheduled for that as soon as possible.

## 2023-03-19 NOTE — Anesthesia Preprocedure Evaluation (Addendum)
Anesthesia Evaluation  Patient identified by MRN, date of birth, ID band Patient awake    Reviewed: Allergy & Precautions, NPO status , Patient's Chart, lab work & pertinent test results  Airway Mallampati: III  TM Distance: >3 FB Neck ROM: Full    Dental no notable dental hx.    Pulmonary former smoker   Pulmonary exam normal        Cardiovascular hypertension, Pt. on medications and Pt. on home beta blockers Normal cardiovascular exam     Neuro/Psych  PSYCHIATRIC DISORDERS  Depression    negative neurological ROS     GI/Hepatic Neg liver ROS, hiatal hernia,GERD  Medicated and Controlled,,  Endo/Other  Hypothyroidism    Renal/GU Renal disease     Musculoskeletal  (+) Arthritis ,    Abdominal  (+) + obese  Peds  Hematology negative hematology ROS (+)   Anesthesia Other Findings RIGHT RENAL CALCULI  Reproductive/Obstetrics                             Anesthesia Physical Anesthesia Plan  ASA: 3  Anesthesia Plan: General   Post-op Pain Management:    Induction: Intravenous  PONV Risk Score and Plan: 2 and Ondansetron, Dexamethasone, Midazolam and Treatment may vary due to age or medical condition  Airway Management Planned: LMA  Additional Equipment:   Intra-op Plan:   Post-operative Plan: Extubation in OR  Informed Consent: I have reviewed the patients History and Physical, chart, labs and discussed the procedure including the risks, benefits and alternatives for the proposed anesthesia with the patient or authorized representative who has indicated his/her understanding and acceptance.     Dental advisory given  Plan Discussed with: CRNA  Anesthesia Plan Comments:        Anesthesia Quick Evaluation

## 2023-03-22 ENCOUNTER — Encounter (HOSPITAL_BASED_OUTPATIENT_CLINIC_OR_DEPARTMENT_OTHER): Payer: Self-pay | Admitting: Urology

## 2023-03-22 NOTE — Anesthesia Postprocedure Evaluation (Signed)
Anesthesia Post Note  Patient: Luke Skinner  Procedure(s) Performed: CYSTOSCOPY RIGHT URETEROSCOPY, HOLMIUM LASER LITHOTRIPSY, RIGHT URETERAL STENT PLACEMENT (Right)     Patient location during evaluation: PACU Anesthesia Type: General Level of consciousness: awake Pain management: pain level controlled Vital Signs Assessment: post-procedure vital signs reviewed and stable Respiratory status: spontaneous breathing, nonlabored ventilation and respiratory function stable Cardiovascular status: blood pressure returned to baseline and stable Postop Assessment: no apparent nausea or vomiting Anesthetic complications: no   No notable events documented.  Last Vitals:  Vitals:   03/19/23 1300 03/19/23 1400  BP: (!) 169/62 (!) 159/55  Pulse: 68 73  Resp: 11 16  Temp:  36.4 C  SpO2: 91% 95%    Last Pain:  Vitals:   03/22/23 1059  TempSrc:   PainSc: 3    Pain Goal: Patients Stated Pain Goal: 4 (03/19/23 1300)                 Hilarie Sinha P Quenton Recendez

## 2023-03-24 ENCOUNTER — Emergency Department (HOSPITAL_COMMUNITY)
Admission: EM | Admit: 2023-03-24 | Discharge: 2023-03-24 | Disposition: A | Discharge status: Home / Self Care | Payer: PPO | Attending: Emergency Medicine | Admitting: Emergency Medicine

## 2023-03-24 ENCOUNTER — Ambulatory Visit: Payer: PPO | Admitting: Internal Medicine

## 2023-03-24 ENCOUNTER — Encounter: Payer: Self-pay | Admitting: Internal Medicine

## 2023-03-24 ENCOUNTER — Emergency Department (HOSPITAL_COMMUNITY): Payer: PPO

## 2023-03-24 ENCOUNTER — Encounter (HOSPITAL_COMMUNITY): Payer: Self-pay

## 2023-03-24 ENCOUNTER — Other Ambulatory Visit: Payer: Self-pay

## 2023-03-24 VITALS — BP 138/80 | HR 75 | Ht 69.0 in | Wt 239.0 lb

## 2023-03-24 DIAGNOSIS — Z79890 Hormone replacement therapy (postmenopausal): Secondary | ICD-10-CM | POA: Diagnosis not present

## 2023-03-24 DIAGNOSIS — N200 Calculus of kidney: Secondary | ICD-10-CM | POA: Diagnosis not present

## 2023-03-24 DIAGNOSIS — R7301 Impaired fasting glucose: Secondary | ICD-10-CM | POA: Diagnosis not present

## 2023-03-24 DIAGNOSIS — Z79899 Other long term (current) drug therapy: Secondary | ICD-10-CM | POA: Insufficient documentation

## 2023-03-24 DIAGNOSIS — K219 Gastro-esophageal reflux disease without esophagitis: Secondary | ICD-10-CM

## 2023-03-24 DIAGNOSIS — E039 Hypothyroidism, unspecified: Secondary | ICD-10-CM | POA: Insufficient documentation

## 2023-03-24 DIAGNOSIS — I10 Essential (primary) hypertension: Secondary | ICD-10-CM | POA: Diagnosis not present

## 2023-03-24 DIAGNOSIS — N132 Hydronephrosis with renal and ureteral calculous obstruction: Secondary | ICD-10-CM | POA: Insufficient documentation

## 2023-03-24 DIAGNOSIS — R7989 Other specified abnormal findings of blood chemistry: Secondary | ICD-10-CM | POA: Insufficient documentation

## 2023-03-24 DIAGNOSIS — R109 Unspecified abdominal pain: Secondary | ICD-10-CM | POA: Diagnosis present

## 2023-03-24 DIAGNOSIS — K222 Esophageal obstruction: Secondary | ICD-10-CM | POA: Diagnosis not present

## 2023-03-24 LAB — URINALYSIS, ROUTINE W REFLEX MICROSCOPIC
Bilirubin Urine: NEGATIVE
Glucose, UA: NEGATIVE mg/dL
Ketones, ur: NEGATIVE mg/dL
Nitrite: NEGATIVE
Protein, ur: 30 mg/dL — AB
RBC / HPF: >50 RBC/hpf (ref 0–5)
Specific Gravity, Urine: 1.018 (ref 1.005–1.030)
pH: 5 (ref 5.0–8.0)

## 2023-03-24 LAB — CBC WITH DIFFERENTIAL/PLATELET
Abs Immature Granulocytes: 0.08 10*3/uL — ABNORMAL HIGH (ref 0.00–0.07)
Basophils Absolute: 0 10*3/uL (ref 0.0–0.1)
Basophils Relative: 0 %
Eosinophils Absolute: 0.1 10*3/uL (ref 0.0–0.5)
Eosinophils Relative: 1 %
HCT: 43.7 % (ref 39.0–52.0)
Hemoglobin: 13.8 g/dL (ref 13.0–17.0)
Immature Granulocytes: 1 %
Lymphocytes Relative: 9 %
Lymphs Abs: 1.2 10*3/uL (ref 0.7–4.0)
MCH: 27.7 pg (ref 26.0–34.0)
MCHC: 31.6 g/dL (ref 30.0–36.0)
MCV: 87.8 fL (ref 80.0–100.0)
Monocytes Absolute: 1.2 10*3/uL — ABNORMAL HIGH (ref 0.1–1.0)
Monocytes Relative: 9 %
Neutro Abs: 11.5 10*3/uL — ABNORMAL HIGH (ref 1.7–7.7)
Neutrophils Relative %: 80 %
Platelets: 279 10*3/uL (ref 150–400)
RBC: 4.98 MIL/uL (ref 4.22–5.81)
RDW: 14 % (ref 11.5–15.5)
WBC: 14.1 10*3/uL — ABNORMAL HIGH (ref 4.0–10.5)
nRBC: 0 % (ref 0.0–0.2)

## 2023-03-24 LAB — COMPREHENSIVE METABOLIC PANEL
ALT: 34 U/L (ref 0–44)
AST: 25 U/L (ref 15–41)
Albumin: 3.8 g/dL (ref 3.5–5.0)
Alkaline Phosphatase: 78 U/L (ref 38–126)
Anion gap: 11 (ref 5–15)
BUN: 30 mg/dL — ABNORMAL HIGH (ref 8–23)
CO2: 25 mmol/L (ref 22–32)
Calcium: 9.5 mg/dL (ref 8.9–10.3)
Chloride: 100 mmol/L (ref 98–111)
Creatinine, Ser: 1.56 mg/dL — ABNORMAL HIGH (ref 0.61–1.24)
GFR, Estimated: 48 mL/min — ABNORMAL LOW (ref >60)
Glucose, Bld: 156 mg/dL — ABNORMAL HIGH (ref 70–99)
Potassium: 4.4 mmol/L (ref 3.5–5.1)
Sodium: 136 mmol/L (ref 135–145)
Total Bilirubin: 1 mg/dL (ref 0.3–1.2)
Total Protein: 7.9 g/dL (ref 6.5–8.1)

## 2023-03-24 MED ORDER — SODIUM CHLORIDE 0.9 % IV BOLUS
1000.0000 mL | Freq: Once | INTRAVENOUS | Status: AC
  Administered 2023-03-24: 1000 mL via INTRAVENOUS

## 2023-03-24 MED ORDER — METOPROLOL TARTRATE 25 MG PO TABS
50.0000 mg | ORAL_TABLET | Freq: Once | ORAL | Status: AC
  Administered 2023-03-24: 50 mg via ORAL
  Filled 2023-03-24: qty 2

## 2023-03-24 MED ORDER — HYDROMORPHONE HCL 1 MG/ML IJ SOLN
1.0000 mg | Freq: Once | INTRAMUSCULAR | Status: AC
  Administered 2023-03-24: 1 mg via INTRAVENOUS
  Filled 2023-03-24: qty 1

## 2023-03-24 MED ORDER — ONDANSETRON HCL 4 MG/2ML IJ SOLN
4.0000 mg | Freq: Once | INTRAMUSCULAR | Status: AC
  Administered 2023-03-24: 4 mg via INTRAVENOUS
  Filled 2023-03-24: qty 2

## 2023-03-24 MED ORDER — KETOROLAC TROMETHAMINE 15 MG/ML IJ SOLN
15.0000 mg | Freq: Once | INTRAMUSCULAR | Status: AC
  Administered 2023-03-24: 15 mg via INTRAVENOUS
  Filled 2023-03-24: qty 1

## 2023-03-24 MED ORDER — FLUCONAZOLE 150 MG PO TABS: 150.0000 mg | ORAL_TABLET | Freq: Every day | ORAL | 0 refills | Status: AC

## 2023-03-24 MED ORDER — PANTOPRAZOLE SODIUM 40 MG PO TBEC: 40.0000 mg | DELAYED_RELEASE_TABLET | Freq: Every day | ORAL | 3 refills | Status: DC

## 2023-03-24 MED ORDER — FLUCONAZOLE 150 MG PO TABS
150.0000 mg | ORAL_TABLET | Freq: Once | ORAL | Status: AC
  Administered 2023-03-24: 150 mg via ORAL
  Filled 2023-03-24: qty 1

## 2023-03-24 NOTE — Progress Notes (Signed)
HISTORY OF PRESENT ILLNESS:  Luke Skinner is a 67 y.o. male with past medical history as listed below who presents today for follow-up after recent upper endoscopy.  He was evaluated by the GI advanced practitioner November 05, 2022 regarding globus sensation and dysphagia.  He subsequently underwent barium esophagram which revealed a distal esophageal stricture.  Thus, upper endoscopy was performed December 31, 2022.  He was found to have a distal esophageal stricture with associated esophagitis.  He was balloon dilated to a maximal diameter of 20 mm he was prescribed pantoprazole 40 mg daily and asked to follow-up at this time.  He is accompanied by his wife.  He is in discomfort from having had a ureteral stent removed earlier today as he is dealing with kidney stones.  In terms of his swallowing, he states this has improved significantly.  He described it is 90% better.  Still with occasional globus type symptoms.  No active reflux symptoms.  Tolerating pantoprazole.  REVIEW OF SYSTEMS:  All non-GI ROS negative except for back pain  Past Medical History:  Diagnosis Date   Diverticulosis of colon    left side   GERD (gastroesophageal reflux disease)    Hiatal hernia    History of adenomatous polyp of colon    History of duodenal ulcer 2001   non-bleeding   History of kidney stones 2015   History of repair of tracheoesophageal fistula 1957   Hyperlipidemia, mixed    Hypertension    nuclear stress test in epic 08-05-2014  normal study no ischemic, nuclear ef 56%   Hypothyroidism    endocrinologist-- dr Luke Skinner;   dx age 73s   OA (osteoarthritis)    Renal calculus, right    S/P dilatation of esophageal stricture    followed by dr Luke Skinner;   last balloon dilation 12-31-2022;   hx multiple dilatation's    Past Surgical History:  Procedure Laterality Date   COLONOSCOPY WITH PROPOFOL  02/24/2021   per Dr. Marina Skinner, adenomatous polyp, repeat in 7 yrs   CYSTOSCOPY/URETEROSCOPY/HOLMIUM  LASER/STENT PLACEMENT Right 03/19/2023   Procedure: CYSTOSCOPY RIGHT URETEROSCOPY, HOLMIUM LASER LITHOTRIPSY, RIGHT URETERAL STENT PLACEMENT;  Surgeon: Luke Fat, MD;  Location: [facility];  Service: Urology;  Laterality: Right;  100 MINUTES   ESOPHAGOGASTRODUODENOSCOPY (EGD) WITH ESOPHAGEAL DILATION  12/31/2022   dr Luke Skinner;   balloon dilatation   GASTROSTOMY  1989   per Luke Skinner HERNIA REPAIR Left 1991   KNEE ARTHROSCOPY Left    1973;  1975;  1990;  2003   NASAL SEPTUM SURGERY  1992   SHOULDER ARTHROSCOPY W/ SUBACROMIAL DECOMPRESSION AND DISTAL CLAVICLE EXCISION Right 07/23/2004   @ [facility] by Dr. Lequita Skinner;   and labral debridement   TOTAL HIP ARTHROPLASTY Right 10/21/2016   Procedure: RIGHT TOTAL HIP ARTHROPLASTY ANTERIOR APPROACH;  Surgeon: Luke Gross, MD;  Location: [facility];  Service: Orthopedics;  Laterality: Right;   TOTAL HIP ARTHROPLASTY Left 10/07/2022   Procedure: TOTAL HIP ARTHROPLASTY ANTERIOR APPROACH;  Surgeon: Luke Gross, MD;  Location: [facility];  Service: Orthopedics;  Laterality: Left;   TOTAL KNEE ARTHROPLASTY  08/26/2007   @ [facility]  Dr. Lequita Skinner   TRACHEOESOPHAGEAL FISTULA REPAIR  1957   3 months old    Social History Luke Skinner  reports that he quit smoking about 24 years ago. His smoking use included cigarettes. He has a 14.00 pack-year smoking history. He has never used smokeless tobacco. He reports that he does  not currently use alcohol. He reports that he does not use drugs.  family history includes Diabetes in his maternal uncle; Heart disease in his mother; Parkinsonism in his father; Thyroid disease in his mother and sister.  Allergies  Allergen Reactions   Shellfish Allergy     Per pt crabs  "feels weird"   Simvastatin     Joint pain.     Sulfa Antibiotics Hives       PHYSICAL EXAMINATION Vital signs: BP (!) 140/78   Pulse 75   Ht 5\' 9"  (1.753 m)   Wt 239 lb (108.4 kg)   BMI 35.29 kg/m  General:  Well-developed, well-nourished, no acute distress but uncomfortable leaning forward after his urologic procedure HEENT: Anicteric Abdomen: Not reexamined. Extremities: No abnormalities on the visible extremities Psychiatric: alert and oriented x3. Cooperative   ASSESSMENT:  1.  GERD complicated by esophagitis and peptic stricture.  Significantly improved post dilation on PPI. 2.  History of adenomatous colon polyp May 2022   PLAN:  1.  Reflux precautions 2.  Prescribe pantoprazole 40 mg daily for 1 year.  Medication risk reviewed 3.  Routine GI office follow-up 1 year.  He knows to contact the office in the interim for questions, problems, or recurrent dysphagia. 4.  Surveillance colonoscopy around May 2029. Resume general medical care with Dr. Clent Skinner

## 2023-03-24 NOTE — Patient Instructions (Signed)
We have sent the following medications to your pharmacy for you to pick up at your convenience:  Pantoprazole  _______________________________________________________  If your blood pressure at your visit was 140/90 or greater, please contact your primary care physician to follow up on this.  _______________________________________________________  If you are age 67 or older, your body mass index should be between 23-30. Your Body mass index is 35.29 kg/m. If this is out of the aforementioned range listed, please consider follow up with your Primary Care Provider.  If you are age 62 or younger, your body mass index should be between 19-25. Your Body mass index is 35.29 kg/m. If this is out of the aformentioned range listed, please consider follow up with your Primary Care Provider.   ________________________________________________________  The [place] [facility] providers would like to encourage you to use [facility] to communicate with providers for non-urgent requests or questions.  Due to long hold times on the telephone, sending your provider a message by [facility] may be a faster and more efficient way to get a response.  Please allow 48 business hours for a response.  Please remember that this is for non-urgent requests.  _______________________________________________________  Please follow up in one year

## 2023-03-24 NOTE — ED Triage Notes (Signed)
Recent kidney stone removal by urologist. Pt had 4 stones removed and stent placed in right kidney. Pt took stent out at home today, was supposed to take it out tomorrow morning but was having incontinence so took it out early. NO issues after taking stent out until an hour or so ago. Pt is now having increasing pain. Denies difficulty urinating or frank blood in urine.

## 2023-03-24 NOTE — ED Provider Notes (Signed)
[facility] EMERGENCY DEPARTMENT AT [facility] Provider Note   CSN: [id number] Arrival date & time: 03/24/23  1653     History  Chief Complaint  Patient presents with   Flank Pain    Derrich Dezarn Glassburn is a 67 y.o. male with a past medical history significant for hypertension, hyperlipidemia, hypothyroidism, history of kidney stones who presents to the ED due to right sided low back pain that started around 1 PM today. Patient had a right ureteral stent placed on 5/31 due to kidney stones by Dr. Marlou Porch. He was advised to remove the stent tomorrow however, had numerous episodes of urinary incontinence so removed stent around 11:30 AM this morning. He notes he developed significant right sided low back pain around 1PM today associated with decreased urinary output. Denies hematuria. No fever or chills. No issues with removal of stent. He was advised by urology to report to the ED for further evaluation.   History obtained from patient and past medical records. No interpreter used during encounter.       Home Medications Prior to Admission medications   Medication Sig Start Date End Date Taking? Authorizing Provider  fluconazole (DIFLUCAN) 150 MG tablet Take 1 tablet (150 mg total) by mouth daily for 5 days. 03/24/23 03/29/23 Yes Shadara Lopez, Merla Riches, PA-C  amoxicillin (AMOXIL) 500 MG tablet Take 2,000 mg by mouth See admin instructions. Takes before dental procedures    [provider]  Ascorbic Acid (VITAMIN C) 1000 MG tablet Take 1,000 mg by mouth daily.    [provider]  B Complex Vitamins (VITAMIN B COMPLEX PO) Take 1 tablet by mouth daily.    [provider]  ciprofloxacin (CIPRO) 500 MG tablet Take 1 tablet (500 mg total) by mouth 2 (two) times daily. Patient not taking: Reported on 03/24/2023 03/19/23   Crist Fat, MD  cyanocobalamin (VITAMIN B12) 1000 MCG tablet Take 1,000 mcg by mouth daily.    [provider]  diphenhydrAMINE  (BENADRYL) 25 MG tablet Take 25 mg by mouth at bedtime. For sleep.    [provider]  ezetimibe (ZETIA) 10 MG tablet Take 1 tablet (10 mg total) by mouth daily. Patient taking differently: Take 10 mg by mouth daily. 02/09/23   Nelwyn Salisbury, MD  hydrochlorothiazide (HYDRODIURIL) 25 MG tablet Take 1 tablet (25 mg total) by mouth daily. Patient taking differently: Take 25 mg by mouth daily. 02/09/23   Nelwyn Salisbury, MD  HYDROcodone-acetaminophen (NORCO/VICODIN) 5-325 MG tablet Take 1-2 tablets by mouth every 6 (six) hours as needed for severe pain. 03/19/23   Crist Fat, MD  ketoconazole (NIZORAL) 2 % cream Apply 1 Application topically 2 (two) times daily. Patient taking differently: Apply 1 Application topically 2 (two) times daily as needed. 02/09/23   Nelwyn Salisbury, MD  levothyroxine (SYNTHROID) 50 MCG tablet Take 50 mcg by mouth daily before breakfast.    [provider]  meloxicam (MOBIC) 15 MG tablet Take 1 tablet (15 mg total) by mouth daily. Patient taking differently: Take 15 mg by mouth daily. 02/09/23   Nelwyn Salisbury, MD  methocarbamol (ROBAXIN) 500 MG tablet Take 1 tablet (500 mg total) by mouth every 6 (six) hours as needed for muscle spasms. 02/09/23   Nelwyn Salisbury, MD  metoprolol succinate (TOPROL-XL) 50 MG 24 hr tablet TAKE 1 TABLET BY MOUTH ONCE DAILY WITH  OR  IMMEDIATELY  FOLLOWING  A  MEAL. Patient taking differently: Take 50 mg by mouth  at bedtime. TAKE 1 TABLET BY MOUTH ONCE DAILY WITH  OR  IMMEDIATELY  FOLLOWING  A  MEAL. 02/09/23   Nelwyn Salisbury, MD  mometasone (ELOCON) 0.1 % cream Apply topically as needed. Patient taking differently: Apply topically as needed (rash). 02/09/23   Nelwyn Salisbury, MD  Multiple Vitamins-Minerals (MENS MULTIVITAMIN PO) Take 1 tablet by mouth at bedtime.    [provider]  pantoprazole (PROTONIX) 40 MG tablet Take 1 tablet (40 mg total) by mouth daily. 03/24/23 03/23/24  Hilarie Fredrickson, MD  Potassium 99 MG TABS Take  99 mg by mouth at bedtime.    [provider]  Potassium Gluconate 595 MG CAPS Take 595 mg by mouth at bedtime.    [provider]  pyridoxine (B-6) 100 MG tablet Take 100 mg by mouth daily.    [provider]  telmisartan (MICARDIS) 80 MG tablet Take 1 tablet (80 mg total) by mouth daily. Patient taking differently: Take 80 mg by mouth daily. 02/09/23   Nelwyn Salisbury, MD  valACYclovir (VALTREX) 1000 MG tablet Take 1 tablet (1,000 mg total) by mouth 2 (two) times daily as needed (fever bloisters). Patient taking differently: Take 1,000 mg by mouth 2 (two) times daily as needed (fever blisters). 02/09/23   Nelwyn Salisbury, MD      Allergies    Shellfish allergy, Simvastatin, and Sulfa antibiotics    Review of Systems   Review of Systems  Constitutional:  Negative for fever.  Respiratory:  Negative for shortness of breath.   Cardiovascular:  Negative for chest pain.  Genitourinary:  Positive for difficulty urinating. Negative for dysuria and hematuria.  Musculoskeletal:  Positive for back pain.    Physical Exam Updated Vital Signs BP (!) 181/63   Pulse 85   Temp 97.9 F (36.6 C)   Resp 18   Ht 5\' 9"  (1.753 m)   Wt 108.4 kg   SpO2 96%   BMI 35.29 kg/m  Physical Exam Vitals and nursing note reviewed.  Constitutional:      General: He is not in acute distress.    Appearance: He is ill-appearing.     Comments: Appears to be uncomfortable in bed with wet washcloth on forehead.  HENT:     Head: Normocephalic.  Eyes:     Pupils: Pupils are equal, round, and reactive to light.  Cardiovascular:     Rate and Rhythm: Normal rate and regular rhythm.     Pulses: Normal pulses.     Heart sounds: Normal heart sounds. No murmur heard.    No friction rub. No gallop.  Pulmonary:     Effort: Pulmonary effort is normal.     Breath sounds: Normal breath sounds.  Abdominal:     General: Abdomen is flat. There is no distension.     Palpations: Abdomen is soft.      Tenderness: There is no abdominal tenderness. There is no guarding or rebound.  Musculoskeletal:        General: Normal range of motion.     Cervical back: Neck supple.  Skin:    General: Skin is warm and dry.  Neurological:     General: No focal deficit present.     Mental Status: He is alert.  Psychiatric:        Mood and Affect: Mood normal.        Behavior: Behavior normal.     ED Results / Procedures / Treatments   Labs (all labs ordered  are listed, but only abnormal results are displayed) Labs Reviewed  CBC WITH DIFFERENTIAL/PLATELET - Abnormal; Notable for the following components:      Result Value   WBC 14.1 (*)    Neutro Abs 11.5 (*)    Monocytes Absolute 1.2 (*)    Abs Immature Granulocytes 0.08 (*)    All other components within normal limits  COMPREHENSIVE METABOLIC PANEL - Abnormal; Notable for the following components:   Glucose, Bld 156 (*)    BUN 30 (*)    Creatinine, Ser 1.56 (*)    GFR, Estimated 48 (*)    All other components within normal limits  URINALYSIS, ROUTINE W REFLEX MICROSCOPIC - Abnormal; Notable for the following components:   APPearance HAZY (*)    Hgb urine dipstick LARGE (*)    Protein, ur 30 (*)    Leukocytes,Ua SMALL (*)    Bacteria, UA RARE (*)    All other components within normal limits  URINE CULTURE    EKG None  Radiology CT Renal Stone Study  Result Date: 03/24/2023 CLINICAL DATA:  Abdominal and flank pain. Stone suspected. Recent kidney stone removal with stent placement. EXAM: CT ABDOMEN AND PELVIS WITHOUT CONTRAST TECHNIQUE: Multidetector CT imaging of the abdomen and pelvis was performed following the standard protocol without IV contrast. RADIATION DOSE REDUCTION: This exam was performed according to the departmental dose-optimization program which includes automated exposure control, adjustment of the mA and/or kV according to patient size and/or use of iterative reconstruction technique. COMPARISON:  10/05/2014  FINDINGS: Lower chest: Lung bases are clear except for minimal scarring. Hepatobiliary: Liver parenchyma is normal without contrast. No calcified gallstones. Pancreas: Normal Spleen: Normal Adrenals/Urinary Tract: Adrenal glands are normal. Left kidney contains a 2.5 mm nonobstructing stone in the midportion and a 1 mm stone in the upper pole. No hydronephrosis or passing stone. 16 mm exophytic isodense lesion at the lower pole of the left kidney is indeterminate for cyst versus mass. Has more recent cross-sectional imaging been performed elsewhere? If not, renal MRI with and without contrast would be recommended. The right kidney shows hydroureteronephrosis. Few small nonobstructing stones within the collecting system, the largest in the midportion measuring 3.5 mm. The ureter is dilated to the pelvic brim where there is a 4 mm stone and a second 1 mm stone. No stone at the UVJ. No stone in the bladder, though bladder detail is limited by hip replacements. Stomach/Bowel: Stomach and small intestine are normal. Normal appendix. Normal colon. Vascular/Lymphatic: Aortic atherosclerosis. No aneurysm. IVC is normal. No adenopathy. Reproductive: Normal.  Limited detail because of hip replacements. Other: No free fluid or air. Musculoskeletal: Chronic degenerative changes throughout the lower thoracic and lumbar spine. IMPRESSION: 1. Hydroureteronephrosis on the right secondary to a 4 mm stone in the distal right ureter at the pelvic brim. Second 1 mm stone just past that. No stone at the right UVJ. 2. Few other small nonobstructing renal calculi on both sides. 3. 16 mm exophytic isodense lesion at the lower pole of the left kidney. This is indeterminate for cyst versus mass. Has more recent cross-sectional imaging been performed elsewhere? If not, renal MRI with and without contrast would be recommended. This was not present in December of 2015. 4. Aortic atherosclerosis. 5. Chronic degenerative changes of the lower  thoracic and lumbar spine. Aortic Atherosclerosis (ICD10-I70.0). Electronically Signed   By: Paulina Fusi M.D.   On: 03/24/2023 18:35    Procedures Procedures    Medications Ordered in ED Medications  fluconazole (DIFLUCAN) tablet 150 mg (has no administration in time range)  ketorolac (TORADOL) 15 MG/ML injection 15 mg (has no administration in time range)  HYDROmorphone (DILAUDID) injection 1 mg (1 mg Intravenous Given 03/24/23 1804)  ondansetron (ZOFRAN) injection 4 mg (4 mg Intravenous Given 03/24/23 1803)  sodium chloride 0.9 % bolus 1,000 mL (0 mLs Intravenous Stopped 03/24/23 2144)  metoprolol tartrate (LOPRESSOR) tablet 50 mg (50 mg Oral Given 03/24/23 2046)    ED Course/ Medical Decision Making/ A&P Clinical Course as of 03/24/23 2157  Wed Mar 24, 2023  1824 WBC(!): 14.1 [CA]  2137 Repaged urology. Awaiting urology recommendations [CA]    Clinical Course User Index [CA] Mannie Stabile, PA-C                             Medical Decision Making Amount and/or Complexity of Data Reviewed Independent Historian: spouse Labs: ordered. Decision-making details documented in ED Course. Radiology: ordered and independent interpretation performed. Decision-making details documented in ED Course.  Risk Prescription drug management.   This patient presents to the ED for concern of right sided back pain, this involves an extensive number of treatment options, and is a complaint that carries with it a high risk of complications and morbidity.  The differential diagnosis includes kidney stone, pyelonephritis, MSK etiology, etc  67 year old male presents to the ED due to right low back pain that started a few hours prior to arrival.  Patient had a ureteral stent placed on 5/31 and remove stent earlier today.  No issues with removal however, developed significant pain 2 hours later.  Denies dysuria and hematuria.  No fever or chills.  Upon arrival patient significantly hypertensive at 203/74  likely due to significant pain.  Will continue to monitor once pain is controlled.  Some reproducible tenderness to right low back region.  Lower extremities neurovascularly intact.  Low suspicion for cauda equina or central cord compression.  CT renal study to assess for kidney stone. Labs ordered. UA to rule out infection.   CBC significant for leukocytosis at 14.  Normal hemoglobin.  CMP significant for elevated creatinine 1.56 and BUN at 30.  Hyperglycemia 156.  No anion gap. Ct abdomen personally reviewed and interpreted which demonstrated hydrouteronephrosis on the right secondary to a 4 mm stone and another 1 mm stone.  Also demonstrates few small nonobstructing renal calculi. 16mm lesion at the lower pole of the left kidney. Patient made aware of incidental finding and need to follow-up and possible MRI.  7:19 PM reassessed patient at bedside.  Patient admits to significant improvement in pain.  Rates his pain a 1/2.  Awaiting UA. BP has improved.  UA with budding yeast, rare bacteria, and small leukocytes.  Does have 21-50 white blood cells.  Proteinuria.  Large hematuria. Will discuss with urology for further recommendations.   9:55 PM discussed with Dr. Laverle Patter with urology who notes urine appears consistent with recent stent.  No concern for acute cystitis.  Recommends treating yeast with fluconazole.  Can follow-up with urology in the outpatient setting if pain does not improve.  Upon reassessment, patient rates pain a 1 or 2. Toradol given prior to discharge. Strict ED precautions discussed with patient. Patient states understanding and agrees to plan. Patient discharged home in no acute distress and stable vitals  Has PCP Lives at home        Final Clinical Impression(s) / ED Diagnoses Final diagnoses:  Kidney stone  Rx / DC Orders ED Discharge Orders          Ordered    fluconazole (DIFLUCAN) 150 MG tablet  Daily        03/24/23 2147              Mannie Stabile, PA-C 03/24/23 2157    Rozelle Logan, DO 03/25/23 1557

## 2023-03-24 NOTE — Discharge Instructions (Signed)
It was a pleasure taking care of you today.  As discussed, you have 2 different kidney stones a 4 mm and 1 mm.  You are allergic to sulfa so I am unable to send you home with Flomax.  Your urine showed yeast.  I am sending you home with fluconazole.  Take for the next 5 days.  Continue to take your hydrocodone as needed for severe pain.  Please follow-up with urology for further evaluation.  Your CT scan also showed a 16 mm lesion on your left kidney.  Please follow-up with urology and PCP for further evaluation.  Return to the ER for any worsening symptoms.

## 2023-03-24 NOTE — ED Notes (Signed)
Patient transported to CT 

## 2023-03-26 ENCOUNTER — Other Ambulatory Visit: Payer: Self-pay | Admitting: Urology

## 2023-03-26 DIAGNOSIS — N201 Calculus of ureter: Secondary | ICD-10-CM | POA: Diagnosis not present

## 2023-03-26 DIAGNOSIS — N3000 Acute cystitis without hematuria: Secondary | ICD-10-CM | POA: Diagnosis not present

## 2023-03-26 LAB — URINE CULTURE: Culture: NO GROWTH

## 2023-03-26 MED ORDER — HYDROMORPHONE HCL 2 MG PO TABS: 2.0000 mg | ORAL_TABLET | ORAL | 0 refills | Status: DC | PRN

## 2023-03-27 LAB — CALCULI, WITH PHOTOGRAPH (CLINICAL LAB)
Calcium Oxalate Monohydrate: 100 %
Weight Calculi: 93 mg

## 2023-03-29 ENCOUNTER — Telehealth: Payer: Self-pay

## 2023-03-29 NOTE — Telephone Encounter (Signed)
Transition Care Management Follow-up Telephone Call Date of discharge and from where: [facility] 6/4 How have you been since you were released from the hospital? Good  Any questions or concerns? No  Items Reviewed: Did the pt receive and understand the discharge instructions provided? Yes  Medications obtained and verified? Yes  Other? No  Any new allergies since your discharge? No  Dietary orders reviewed? No Do you have support at home? Yes     Follow up appointments reviewed:  PCP Hospital f/u appt confirmed? Yes  Scheduled to see  on  @ . Specialist Hospital f/u appt confirmed? No  Scheduled to see   on   @  . Are transportation arrangements needed? No  If their condition worsens, is the pt aware to call PCP or go to the Emergency Dept.? Yes Was the patient provided with contact information for the PCP's office or ED? Yes Was to pt encouraged to call back with questions or concerns? Yes

## 2023-03-30 ENCOUNTER — Ambulatory Visit (INDEPENDENT_AMBULATORY_CARE_PROVIDER_SITE_OTHER): Payer: PPO | Admitting: Family Medicine

## 2023-03-30 ENCOUNTER — Encounter: Payer: Self-pay | Admitting: Family Medicine

## 2023-03-30 VITALS — BP 124/64 | HR 72 | Temp 98.0°F | Wt 240.0 lb

## 2023-03-30 DIAGNOSIS — I700 Atherosclerosis of aorta: Secondary | ICD-10-CM

## 2023-03-30 DIAGNOSIS — N2889 Other specified disorders of kidney and ureter: Secondary | ICD-10-CM | POA: Diagnosis not present

## 2023-03-30 DIAGNOSIS — N200 Calculus of kidney: Secondary | ICD-10-CM | POA: Diagnosis not present

## 2023-03-30 MED ORDER — HYDROCODONE-ACETAMINOPHEN 10-325 MG PO TABS: 1.0000 | ORAL_TABLET | ORAL | 0 refills | Status: AC | PRN

## 2023-03-30 NOTE — Progress Notes (Signed)
   Subjective:    Patient ID: Luke Skinner, male    DOB: November 09, 1955, 67 y.o.   MRN: [id number]  HPI Here for a transitional care visit to follow up an ED visit on 03-24-23. He presented with sharp severe pain in the right lower back. He had a right ureter stent in place that morning to allow him to pass kidney stones, and the stent was removed several hours before this new pain began. At the ED his WBC was mildly elevated at 14.1, and creatinine was stable at 1.56.  A CT scan showed 2 small non-obstructing stones in the left kidney as well as a poorly defined 16 mm lesion in the inferior pole of the left kidney. Also seen were several stones in the right kidney as well as an obstructing 4 mm stone in the distal right ureter. The right side showed hydroureteronephrosis, so Urology was consulted. They were able to breakup the right ureter stones with a laser so he could pass the remnants at home. In addition the CT report mentioned seeing aortic atherosclerosis. Since going home he has felt well other than some mild right back soreness.    Review of Systems  Constitutional: Negative.   Respiratory: Negative.    Cardiovascular: Negative.   Gastrointestinal: Negative.   Genitourinary: Negative.   Musculoskeletal:  Positive for back pain.       Objective:   Physical Exam Constitutional:      General: He is not in acute distress.    Appearance: Normal appearance.  Cardiovascular:     Rate and Rhythm: Normal rate and regular rhythm.     Pulses: Normal pulses.     Heart sounds: Normal heart sounds.  Pulmonary:     Effort: Pulmonary effort is normal.     Breath sounds: Normal breath sounds.  Abdominal:     General: Abdomen is flat. Bowel sounds are normal. There is no distension.     Palpations: Abdomen is soft.     Tenderness: There is no abdominal tenderness. There is no guarding.  Neurological:     Mental Status: He is alert.           Assessment & Plan:  He has multiple small  bilateral kidney stones, and hopefully he can pass these at home. He had a obstruction in the right ureter which has been cleared. He has an ill-defined lesion in the left kidney, so we will set up an abdominal MRI to better evaluate this. He also has aortic atherosclerosis. He does not tolerate statins, so we will refer him to Cardiology to evaluate further. We spent a total of ( 35  ) minutes reviewing records and discussing these issues.  Gershon Crane, MD

## 2023-04-02 ENCOUNTER — Ambulatory Visit (INDEPENDENT_AMBULATORY_CARE_PROVIDER_SITE_OTHER): Payer: PPO

## 2023-04-02 VITALS — Ht 69.0 in | Wt 240.0 lb

## 2023-04-02 DIAGNOSIS — Z0000 Encounter for general adult medical examination without abnormal findings: Secondary | ICD-10-CM | POA: Diagnosis not present

## 2023-04-02 NOTE — Patient Instructions (Addendum)
Luke Skinner , Thank you for taking time to come for your Medicare Wellness Visit. I appreciate your ongoing commitment to your health goals. Please review the following plan we discussed and let me know if I can assist you in the future.   These are the goals we discussed:  Goals       Patient Stated      02/23/2022, wants to lose weight      Weight (lb) < 200 lb (90.7 kg) (pt-stated)        This is a list of the screening recommended for you and due dates:  Health Maintenance  Topic Date Due   COVID-19 Vaccine (8 - 2023-24 season) 04/18/2023*   Pneumonia Vaccine (3 of 3 - PPSV23 or PCV20) 02/09/2024*   Flu Shot  05/20/2023   DTaP/Tdap/Td vaccine (4 - Td or Tdap) 01/13/2024   Medicare Annual Wellness Visit  04/01/2024   Colon Cancer Screening  02/25/2028   Hepatitis C Screening  Completed   Zoster (Shingles) Vaccine  Completed   HPV Vaccine  Aged Out  *Topic was postponed. The date shown is not the original due date.    Advanced directives: Please bring a copy of your health care power of attorney and living will to the office to be added to your chart at your convenience.   Conditions/risks identified: None  Next appointment: Follow up in one year for your annual wellness visit    Preventive Care 65 Years and Older, Male Preventive care refers to lifestyle choices and visits with your health care provider that can promote health and wellness. What does preventive care include? A yearly physical exam. This is also called an annual well check. Dental exams once or twice a year. Routine eye exams. Ask your health care provider how often you should have your eyes checked. Personal lifestyle choices, including: Daily care of your teeth and gums. Regular physical activity. Eating a healthy diet. Avoiding tobacco and drug use. Limiting alcohol use. Practicing safe sex. Taking low-dose aspirin every day. Taking vitamin and mineral supplements as recommended by your health  care provider. What happens during an annual well check? The services and screenings done by your health care provider during your annual well check will depend on your age, overall health, lifestyle risk factors, and family history of disease. Counseling  Your health care provider may ask you questions about your: Alcohol use. Tobacco use. Drug use. Emotional well-being. Home and relationship well-being. Sexual activity. Eating habits. History of falls. Memory and ability to understand (cognition). Work and work Astronomer. Reproductive health. Screening  You may have the following tests or measurements: Height, weight, and BMI. Blood pressure. Lipid and cholesterol levels. These may be checked every 5 years, or more frequently if you are over 47 years old. Skin check. Lung cancer screening. You may have this screening every year starting at age 49 if you have a 30-pack-year history of smoking and currently smoke or have quit within the past 15 years. Fecal occult blood test (FOBT) of the stool. You may have this test every year starting at age 70. Flexible sigmoidoscopy or colonoscopy. You may have a sigmoidoscopy every 5 years or a colonoscopy every 10 years starting at age 74. Hepatitis C blood test. Hepatitis B blood test. Sexually transmitted disease (STD) testing. Diabetes screening. This is done by checking your blood sugar (glucose) after you have not eaten for a while (fasting). You may have this done every 1-3 years. Bone density scan. This  is done to screen for osteoporosis. You may have this done starting at age 87. Mammogram. This may be done every 1-2 years. Talk to your health care provider about how often you should have regular mammograms. Talk with your health care provider about your test results, treatment options, and if necessary, the need for more tests. Vaccines  Your health care provider may recommend certain vaccines, such as: Influenza vaccine. This is  recommended every year. Tetanus, diphtheria, and acellular pertussis (Tdap, Td) vaccine. You may need a Td booster every 10 years. Zoster vaccine. You may need this after age 71. Pneumococcal 13-valent conjugate (PCV13) vaccine. One dose is recommended after age 63. Pneumococcal polysaccharide (PPSV23) vaccine. One dose is recommended after age 26. Talk to your health care provider about which screenings and vaccines you need and how often you need them. This information is not intended to replace advice given to you by your health care provider. Make sure you discuss any questions you have with your health care provider. Document Released: 11/01/2015 Document Revised: 06/24/2016 Document Reviewed: 08/06/2015 Elsevier Interactive Patient Education  2017 [place] Prevention in the Home Falls can cause injuries. They can happen to people of all ages. There are many things you can do to make your home safe and to help prevent falls. What can I do on the outside of my home? Regularly fix the edges of walkways and driveways and fix any cracks. Remove anything that might make you trip as you walk through a door, such as a raised step or threshold. Trim any bushes or trees on the path to your home. Use bright outdoor lighting. Clear any walking paths of anything that might make someone trip, such as rocks or tools. Regularly check to see if handrails are loose or broken. Make sure that both sides of any steps have handrails. Any raised decks and porches should have guardrails on the edges. Have any leaves, snow, or ice cleared regularly. Use sand or salt on walking paths during winter. Clean up any spills in your garage right away. This includes oil or grease spills. What can I do in the bathroom? Use night lights. Install grab bars by the toilet and in the tub and shower. Do not use towel bars as grab bars. Use non-skid mats or decals in the tub or shower. If you need to sit down in  the shower, use a plastic, non-slip stool. Keep the floor dry. Clean up any water that spills on the floor as soon as it happens. Remove soap buildup in the tub or shower regularly. Attach bath mats securely with double-sided non-slip rug tape. Do not have throw rugs and other things on the floor that can make you trip. What can I do in the bedroom? Use night lights. Make sure that you have a light by your bed that is easy to reach. Do not use any sheets or blankets that are too big for your bed. They should not hang down onto the floor. Have a firm chair that has side arms. You can use this for support while you get dressed. Do not have throw rugs and other things on the floor that can make you trip. What can I do in the kitchen? Clean up any spills right away. Avoid walking on wet floors. Keep items that you use a lot in easy-to-reach places. If you need to reach something above you, use a strong step stool that has a grab bar. Keep electrical cords out  of the way. Do not use floor polish or wax that makes floors slippery. If you must use wax, use non-skid floor wax. Do not have throw rugs and other things on the floor that can make you trip. What can I do with my stairs? Do not leave any items on the stairs. Make sure that there are handrails on both sides of the stairs and use them. Fix handrails that are broken or loose. Make sure that handrails are as long as the stairways. Check any carpeting to make sure that it is firmly attached to the stairs. Fix any carpet that is loose or worn. Avoid having throw rugs at the top or bottom of the stairs. If you do have throw rugs, attach them to the floor with carpet tape. Make sure that you have a light switch at the top of the stairs and the bottom of the stairs. If you do not have them, ask someone to add them for you. What else can I do to help prevent falls? Wear shoes that: Do not have high heels. Have rubber bottoms. Are comfortable  and fit you well. Are closed at the toe. Do not wear sandals. If you use a stepladder: Make sure that it is fully opened. Do not climb a closed stepladder. Make sure that both sides of the stepladder are locked into place. Ask someone to hold it for you, if possible. Clearly mark and make sure that you can see: Any grab bars or handrails. First and last steps. Where the edge of each step is. Use tools that help you move around (mobility aids) if they are needed. These include: Canes. Walkers. Scooters. Crutches. Turn on the lights when you go into a dark area. Replace any light bulbs as soon as they burn out. Set up your furniture so you have a clear path. Avoid moving your furniture around. If any of your floors are uneven, fix them. If there are any pets around you, be aware of where they are. Review your medicines with your doctor. Some medicines can make you feel dizzy. This can increase your chance of falling. Ask your doctor what other things that you can do to help prevent falls. This information is not intended to replace advice given to you by your health care provider. Make sure you discuss any questions you have with your health care provider. Document Released: 08/01/2009 Document Revised: 03/12/2016 Document Reviewed: 11/09/2014 Elsevier Interactive Patient Education  2017 [employer].

## 2023-04-02 NOTE — Progress Notes (Signed)
Subjective:   Luke Skinner is a 67 y.o. male who presents for Medicare Annual/Subsequent preventive examination.  Review of Systems     Cardiac Risk Factors include: advanced age (>58men, >49 women);male gender;hypertension     Objective:    Today's Vitals   04/02/23 1434 04/02/23 1446  Weight: 240 lb (108.9 kg)   Height: 5\' 9"  (1.753 m)   PainSc:  0-No pain   Body mass index is 35.44 kg/m.     04/02/2023    2:51 PM 03/19/2023    7:55 AM 10/07/2022    3:34 PM 09/29/2022    8:55 AM 02/23/2022    8:17 AM 10/21/2016   11:30 AM 10/21/2016    6:35 AM  Advanced Directives  Does Patient Have a Medical Advance Directive? Yes No Yes Yes Yes Yes Yes  Type of Estate agent of [place];Living will  Healthcare Power of [place];Living will Healthcare Power of [place];Living will Healthcare Power of [place];Living will Healthcare Power of [place];Living will Healthcare Power of [place];Living will  Does patient want to make changes to medical advance directive?   No - Patient declined   No - Patient declined   Copy of Healthcare Power of Attorney in Chart? No - copy requested  No - copy requested  No - copy requested No - copy requested No - copy requested  Would patient like information on creating a medical advance directive?  No - Patient declined         Current Medications (verified) Outpatient Encounter Medications as of 04/02/2023  Medication Sig   amoxicillin (AMOXIL) 500 MG tablet Take 2,000 mg by mouth See admin instructions. Takes before dental procedures   Ascorbic Acid (VITAMIN C) 1000 MG tablet Take 1,000 mg by mouth daily.   B Complex Vitamins (VITAMIN B COMPLEX PO) Take 1 tablet by mouth daily.   cyanocobalamin (VITAMIN B12) 1000 MCG tablet Take 1,000 mcg by mouth daily.   diphenhydrAMINE (BENADRYL) 25 MG tablet Take 25 mg by mouth at bedtime. For sleep.   ezetimibe (ZETIA) 10 MG tablet Take 1 tablet (10 mg total) by mouth daily. (Patient taking  differently: Take 10 mg by mouth daily.)   hydrochlorothiazide (HYDRODIURIL) 25 MG tablet Take 1 tablet (25 mg total) by mouth daily. (Patient taking differently: Take 25 mg by mouth daily.)   HYDROcodone-acetaminophen (NORCO) 10-325 MG tablet Take 1 tablet by mouth every 4 (four) hours as needed for up to 5 days for moderate pain.   HYDROmorphone (DILAUDID) 2 MG tablet Take 1 tablet (2 mg total) by mouth every 4 (four) hours as needed for severe pain.   ketoconazole (NIZORAL) 2 % cream Apply 1 Application topically 2 (two) times daily. (Patient taking differently: Apply 1 Application topically 2 (two) times daily as needed.)   levothyroxine (SYNTHROID) 50 MCG tablet Take 50 mcg by mouth daily before breakfast.   meloxicam (MOBIC) 15 MG tablet Take 1 tablet (15 mg total) by mouth daily. (Patient taking differently: Take 15 mg by mouth daily.)   methocarbamol (ROBAXIN) 500 MG tablet Take 1 tablet (500 mg total) by mouth every 6 (six) hours as needed for muscle spasms.   metoprolol succinate (TOPROL-XL) 50 MG 24 hr tablet TAKE 1 TABLET BY MOUTH ONCE DAILY WITH  OR  IMMEDIATELY  FOLLOWING  A  MEAL. (Patient taking differently: Take 50 mg by mouth at bedtime. TAKE 1 TABLET BY MOUTH ONCE DAILY WITH  OR  IMMEDIATELY  FOLLOWING  A  MEAL.)  mometasone (ELOCON) 0.1 % cream Apply topically as needed. (Patient taking differently: Apply topically as needed (rash).)   Multiple Vitamins-Minerals (MENS MULTIVITAMIN PO) Take 1 tablet by mouth at bedtime.   pantoprazole (PROTONIX) 40 MG tablet Take 1 tablet (40 mg total) by mouth daily.   Potassium 99 MG TABS Take 99 mg by mouth at bedtime.   Potassium Gluconate 595 MG CAPS Take 595 mg by mouth at bedtime.   pyridoxine (B-6) 100 MG tablet Take 100 mg by mouth daily.   telmisartan (MICARDIS) 80 MG tablet Take 1 tablet (80 mg total) by mouth daily. (Patient taking differently: Take 80 mg by mouth daily.)   valACYclovir (VALTREX) 1000 MG tablet Take 1 tablet (1,000 mg  total) by mouth 2 (two) times daily as needed (fever bloisters). (Patient taking differently: Take 1,000 mg by mouth 2 (two) times daily as needed (fever blisters).)   No facility-administered encounter medications on file as of 04/02/2023.    Allergies (verified) Shellfish allergy, Simvastatin, and Sulfa antibiotics   History: Past Medical History:  Diagnosis Date   Diverticulosis of colon    left side   GERD (gastroesophageal reflux disease)    Hiatal hernia    History of adenomatous polyp of colon    History of duodenal ulcer 2001   non-bleeding   History of kidney stones 2015   History of repair of tracheoesophageal fistula 1957   Hyperlipidemia, mixed    Hypertension    nuclear stress test in epic 08-05-2014  normal study no ischemic, nuclear ef 56%   Hypothyroidism    endocrinologist-- dr m. doerr;   dx age 29s   OA (osteoarthritis)    Renal calculus, right    S/P dilatation of esophageal stricture    followed by dr Marina Goodell;   last balloon dilation 12-31-2022;   hx multiple dilatation's   Past Surgical History:  Procedure Laterality Date   COLONOSCOPY WITH PROPOFOL  02/24/2021   per Dr. Marina Goodell, adenomatous polyp, repeat in 7 yrs   CYSTOSCOPY/URETEROSCOPY/HOLMIUM LASER/STENT PLACEMENT Right 03/19/2023   Procedure: CYSTOSCOPY RIGHT URETEROSCOPY, HOLMIUM LASER LITHOTRIPSY, RIGHT URETERAL STENT PLACEMENT;  Surgeon: Crist Fat, MD;  Location: [facility];  Service: Urology;  Laterality: Right;  100 MINUTES   ESOPHAGOGASTRODUODENOSCOPY (EGD) WITH ESOPHAGEAL DILATION  12/31/2022   dr Marina Goodell;   balloon dilatation   GASTROSTOMY  1989   per Dr. Gwendalyn Ege HERNIA REPAIR Left 1991   KNEE ARTHROSCOPY Left    1973;  1975;  1990;  2003   NASAL SEPTUM SURGERY  1992   SHOULDER ARTHROSCOPY W/ SUBACROMIAL DECOMPRESSION AND DISTAL CLAVICLE EXCISION Right 07/23/2004   @ [facility] by Dr. Lequita Halt;   and labral debridement   TOTAL HIP ARTHROPLASTY Right 10/21/2016    Procedure: RIGHT TOTAL HIP ARTHROPLASTY ANTERIOR APPROACH;  Surgeon: Ollen Gross, MD;  Location: [facility];  Service: Orthopedics;  Laterality: Right;   TOTAL HIP ARTHROPLASTY Left 10/07/2022   Procedure: TOTAL HIP ARTHROPLASTY ANTERIOR APPROACH;  Surgeon: Ollen Gross, MD;  Location: [facility];  Service: Orthopedics;  Laterality: Left;   TOTAL KNEE ARTHROPLASTY  08/26/2007   @ [facility]  Dr. Lequita Halt   TRACHEOESOPHAGEAL FISTULA REPAIR  1957   3 months old   Family History  Problem Relation Age of Onset   Heart disease Mother    Thyroid disease Mother    Parkinsonism Father    Thyroid disease Sister    Diabetes Maternal Uncle    Colon cancer Neg Hx  Colon polyps Neg Hx    Esophageal cancer Neg Hx    Rectal cancer Neg Hx    Stomach cancer Neg Hx    Social History   Socioeconomic History   Marital status: Married    Spouse name: Not on file   Number of children: Not on file   Years of education: 12   Highest education level: 12th grade  Occupational History   Not on file  Tobacco Use   Smoking status: Former    Packs/day: 0.50    Years: 28.00    Additional pack years: 0.00    Total pack years: 14.00    Types: Cigarettes    Quit date: 2000    Years since quitting: 24.4   Smokeless tobacco: Never  Vaping Use   Vaping Use: Never used  Substance and Sexual Activity   Alcohol use: Not Currently   Drug use: Never   Sexual activity: Yes  Other Topics Concern   Not on file  Social History Narrative   Epworth Sleepiness Scale = 10 (07/22/15)   Social Determinants of Health   Financial Resource Strain: Low Risk  (04/02/2023)   Overall Financial Resource Strain (CARDIA)    Difficulty of Paying Living Expenses: Not hard at all  Food Insecurity: No Food Insecurity (04/02/2023)   Hunger Vital Sign    Worried About Running Out of Food in the Last Year: Never true    Ran Out of Food in the Last Year: Never true  Transportation Needs: No Transportation Needs (04/02/2023)   PRAPARE -  Administrator, Civil Service (Medical): No    Lack of Transportation (Non-Medical): No  Physical Activity: Insufficiently Active (04/02/2023)   Exercise Vital Sign    Days of Exercise per Week: 3 days    Minutes of Exercise per Session: 30 min  Stress: No Stress Concern Present (04/02/2023)   [employer] of Occupational Health - Occupational Stress Questionnaire    Feeling of Stress : Not at all  Social Connections: Moderately Isolated (04/02/2023)   Social Connection and Isolation Panel [NHANES]    Frequency of Communication with Friends and Family: More than three times a week    Frequency of Social Gatherings with Friends and Family: More than three times a week    Attends Religious Services: Never    Database administrator or Organizations: No    Attends Engineer, structural: Never    Marital Status: Married    Tobacco Counseling Counseling given: Not Answered   Clinical Intake:  Pre-visit preparation completed: Yes  Pain : No/denies pain Pain Score: 0-No pain     BMI - recorded: 34.44 Nutritional Status: BMI > 30  Obese Nutritional Risks: None Diabetes: No  How often do you need to have someone help you when you read instructions, pamphlets, or other written materials from your doctor or pharmacy?: 1 - Never  Diabetic?  No  Interpreter Needed?: No  Information entered by :: Theresa Mulligan LPN   Activities of Daily Living    04/02/2023    2:50 PM 04/01/2023    8:32 PM  In your present state of health, do you have any difficulty performing the following activities:  Hearing? 0 0  Vision? 0 0  Difficulty concentrating or making decisions? 0 0  Walking or climbing stairs? 0 0  Dressing or bathing? 0 0  Doing errands, shopping? 0   Preparing Food and eating ? N N  Using the Toilet? N N  In the past six months, have you accidently leaked urine? N N  Do you have problems with loss of bowel control? N N  Managing your Medications? N N   Managing your Finances? N N  Housekeeping or managing your Housekeeping? N N    Patient Care Team: Nelwyn Salisbury, MD as PCP - General  Indicate any recent Medical Services you may have received from other than [facility] providers in the past year (date may be approximate).     Assessment:   This is a routine wellness examination for Alecxis.  Hearing/Vision screen Hearing Screening - Comments:: Denies hearing difficulties   Vision Screening - Comments:: Wears rx glasses - up to date with routine eye exams with  Dr Randon Goldsmith  Dietary issues and exercise activities discussed: Current Exercise Habits: Home exercise routine, Type of exercise: walking, Time (Minutes): 30, Frequency (Times/Week): 3, Weekly Exercise (Minutes/Week): 90, Intensity: Moderate, Exercise limited by: None identified   Goals Addressed               This Visit's Progress     Weight (lb) < 200 lb (90.7 kg) (pt-stated)   240 lb (108.9 kg)      Depression Screen    04/02/2023    2:49 PM 02/09/2023    8:28 AM 06/25/2022   10:47 AM 02/23/2022    8:18 AM 01/28/2022    8:43 AM 11/08/2017    8:07 AM  PHQ 2/9 Scores  PHQ - 2 Score 0 0 1 0 1 0  PHQ- 9 Score 0 2 4  5      Fall Risk    04/02/2023    2:51 PM 04/01/2023    8:32 PM 03/29/2023   11:31 AM 02/09/2023    8:27 AM 06/25/2022   10:47 AM  Fall Risk   Falls in the past year? 0 0 0 0 0  Number falls in past yr: 0 0  0 0  Injury with Fall? 0 0  0 0  Risk for fall due to : No Fall Risks   No Fall Risks No Fall Risks  Follow up Falls prevention discussed   Falls evaluation completed Falls evaluation completed    FALL RISK PREVENTION PERTAINING TO THE HOME:  Any stairs in or around the home? No  If so, are there any without handrails? No  Home free of loose throw rugs in walkways, pet beds, electrical cords, etc? Yes  Adequate lighting in your home to reduce risk of falls? Yes   ASSISTIVE DEVICES UTILIZED TO PREVENT FALLS:  Life alert? No  Use of a cane, walker  or w/c? No  Grab bars in the bathroom? No  Shower chair or bench in shower? No  Elevated toilet seat or a handicapped toilet? No   TIMED UP AND GO:  Was the test performed?  Audio Visit.   Cognitive Function:        04/02/2023    2:52 PM 02/23/2022    8:18 AM  6CIT Screen  What Year? 0 points 0 points  What month? 0 points 0 points  What time? 0 points 0 points  Count back from 20 0 points 0 points  Months in reverse 0 points 2 points  Repeat phrase 0 points 0 points  Total Score 0 points 2 points    Immunizations Immunization History  Administered Date(s) Administered   COVID-19, mRNA, vaccine(Comirnaty)12 years and older 12/26/2022   Fluad Quad(high Dose 65+) 07/28/2021, 06/25/2022   Influenza Split 08/02/2012  Influenza Whole 07/19/2005, 08/19/2007   Influenza,inj,Quad PF,6+ Mos 07/15/2017, 08/04/2018   Influenza,inj,quad, With Preservative 06/21/2017   Influenza-Unspecified 07/19/2014, 07/17/2015, 07/15/2016   PFIZER(Purple Top)SARS-COV-2 Vaccination 01/10/2020, 02/17/2021   Pfizer Covid-19 Vaccine Bivalent Booster 27yrs & up 07/23/2021, 02/20/2022   Pneumococcal Conjugate-13 07/18/2015   Pneumococcal Polysaccharide-23 07/19/2005, 03/02/2011   Td 10/19/1998, 01/13/2010   Tdap 01/12/2014   Zoster Recombinat (Shingrix) 07/17/2021, 01/07/2022   Zoster, Live 07/29/2016    TDAP status: Up to date  Flu Vaccine status: Up to date    Covid-19 vaccine status: Completed vaccines  Qualifies for Shingles Vaccine? Yes   Zostavax completed Yes   Shingrix Completed?: Yes  Screening Tests Health Maintenance  Topic Date Due   COVID-19 Vaccine (8 - 2023-24 season) 04/18/2023 (Originally 02/20/2023)   Pneumonia Vaccine 22+ Years old (3 of 3 - PPSV23 or PCV20) 02/09/2024 (Originally 12/24/2020)   INFLUENZA VACCINE  05/20/2023   DTaP/Tdap/Td (4 - Td or Tdap) 01/13/2024   Medicare Annual Wellness (AWV)  04/01/2024   Colonoscopy  02/25/2028   Hepatitis C Screening  Completed    Zoster Vaccines- Shingrix  Completed   HPV VACCINES  Aged Out    Health Maintenance  There are no preventive care reminders to display for this patient.   Colorectal cancer screening: Type of screening: Colonoscopy. Completed 02/24/21. Repeat every 7 years  Lung Cancer Screening: (Low Dose CT Chest recommended if Age 79-80 years, 30 pack-year currently smoking OR have quit w/in 15years.) does not qualify.       Additional Screening:  Hepatitis C Screening: does qualify; Completed 07/17/14  Vision Screening: Recommended annual ophthalmology exams for early detection of glaucoma and other disorders of the eye. Is the patient up to date with their annual eye exam?  Yes  Who is the provider or what is the name of the office in which the patient attends annual eye exams? Dr Randon Goldsmith If pt is not established with a provider, would they like to be referred to a provider to establish care? No .   Dental Screening: Recommended annual dental exams for proper oral hygiene  Community Resource Referral / Chronic Care Management:  CRR required this visit?  No  CCM required this visit?  No      Plan:     I have personally reviewed and noted the following in the patient's chart:   Medical and social history Use of alcohol, tobacco or illicit drugs  Current medications and supplements including opioid prescriptions. Patient is not currently taking opioid prescriptions. Functional ability and status Nutritional status Physical activity Advanced directives List of other physicians Hospitalizations, surgeries, and ER visits in previous 12 months Vitals Screenings to include cognitive, depression, and falls Referrals and appointments  In addition, I have reviewed and discussed with patient certain preventive protocols, quality metrics, and best practice recommendations. A written personalized care plan for preventive services as well as general preventive health recommendations were  provided to patient.     Tillie Rung, LPN   01/25/8118   Nurse Notes:  None

## 2023-04-06 ENCOUNTER — Encounter: Payer: Self-pay | Admitting: Family Medicine

## 2023-04-06 ENCOUNTER — Ambulatory Visit (INDEPENDENT_AMBULATORY_CARE_PROVIDER_SITE_OTHER): Payer: PPO | Admitting: Family Medicine

## 2023-04-06 VITALS — BP 110/64 | HR 70 | Temp 98.0°F | Wt 238.0 lb

## 2023-04-06 DIAGNOSIS — L0232 Furuncle of buttock: Secondary | ICD-10-CM

## 2023-04-06 MED ORDER — DOXYCYCLINE HYCLATE 100 MG PO CAPS: 100.0000 mg | ORAL_CAPSULE | Freq: Two times a day (BID) | ORAL | 0 refills | Status: AC

## 2023-04-06 NOTE — Progress Notes (Signed)
   Subjective:    Patient ID: Luke Skinner, male    DOB: Sep 03, 1956, 67 y.o.   MRN: [id number]  HPI Here for 5 days of a tender boil on the left buttock. He feels well otherwise.    Review of Systems  Constitutional: Negative.   Respiratory: Negative.    Cardiovascular: Negative.        Objective:   Physical Exam Constitutional:      Appearance: Normal appearance.  Cardiovascular:     Rate and Rhythm: Normal rate and regular rhythm.     Pulses: Normal pulses.     Heart sounds: Normal heart sounds.  Pulmonary:     Effort: Pulmonary effort is normal.     Breath sounds: Normal breath sounds.  Skin:    Comments: There is a small tender boil on the upper left buttock about 4 cm from the anus. This is firm, not fluctuant   Neurological:     Mental Status: He is alert.           Assessment & Plan:  Boil, treat with 10 days of Doxycycline.  Gershon Crane, MD

## 2023-04-30 DIAGNOSIS — N132 Hydronephrosis with renal and ureteral calculous obstruction: Secondary | ICD-10-CM | POA: Diagnosis not present

## 2023-05-05 DIAGNOSIS — E538 Deficiency of other specified B group vitamins: Secondary | ICD-10-CM | POA: Diagnosis not present

## 2023-05-05 DIAGNOSIS — E559 Vitamin D deficiency, unspecified: Secondary | ICD-10-CM | POA: Diagnosis not present

## 2023-05-05 DIAGNOSIS — Z96642 Presence of left artificial hip joint: Secondary | ICD-10-CM | POA: Diagnosis not present

## 2023-05-05 DIAGNOSIS — I10 Essential (primary) hypertension: Secondary | ICD-10-CM | POA: Diagnosis not present

## 2023-05-05 DIAGNOSIS — E230 Hypopituitarism: Secondary | ICD-10-CM | POA: Diagnosis not present

## 2023-05-05 DIAGNOSIS — E039 Hypothyroidism, unspecified: Secondary | ICD-10-CM | POA: Diagnosis not present

## 2023-05-24 ENCOUNTER — Other Ambulatory Visit: Payer: PPO

## 2023-05-25 ENCOUNTER — Encounter: Payer: Self-pay | Admitting: Internal Medicine

## 2023-05-25 ENCOUNTER — Ambulatory Visit
Admission: RE | Admit: 2023-05-25 | Discharge: 2023-05-25 | Disposition: A | Discharge status: Home / Self Care | Payer: PPO | Source: Ambulatory Visit | Attending: Family Medicine | Admitting: Family Medicine

## 2023-05-25 ENCOUNTER — Ambulatory Visit: Payer: PPO | Attending: Internal Medicine | Admitting: Internal Medicine

## 2023-05-25 VITALS — BP 146/84 | HR 69 | Ht 69.0 in | Wt 243.6 lb

## 2023-05-25 DIAGNOSIS — K862 Cyst of pancreas: Secondary | ICD-10-CM | POA: Diagnosis not present

## 2023-05-25 DIAGNOSIS — N2889 Other specified disorders of kidney and ureter: Secondary | ICD-10-CM | POA: Diagnosis not present

## 2023-05-25 DIAGNOSIS — E785 Hyperlipidemia, unspecified: Secondary | ICD-10-CM | POA: Diagnosis not present

## 2023-05-25 DIAGNOSIS — M791 Myalgia, unspecified site: Secondary | ICD-10-CM | POA: Diagnosis not present

## 2023-05-25 DIAGNOSIS — T466X5A Adverse effect of antihyperlipidemic and antiarteriosclerotic drugs, initial encounter: Secondary | ICD-10-CM | POA: Diagnosis not present

## 2023-05-25 DIAGNOSIS — I10 Essential (primary) hypertension: Secondary | ICD-10-CM | POA: Diagnosis not present

## 2023-05-25 DIAGNOSIS — K760 Fatty (change of) liver, not elsewhere classified: Secondary | ICD-10-CM | POA: Diagnosis not present

## 2023-05-25 MED ORDER — GADOPICLENOL 0.5 MMOL/ML IV SOLN
10.0000 mL | Freq: Once | INTRAVENOUS | Status: AC | PRN
  Administered 2023-05-25: 10 mL via INTRAVENOUS

## 2023-05-25 NOTE — Patient Instructions (Signed)
Medication Instructions:  Dr. Rennis Golden has recommended Nexletol 180mg  daily   *If you need a refill on your cardiac medications before your next appointment, please call your pharmacy*   Lab Work: FASTING NMR lipoprofile in 3-4 months   If you have labs (blood work) drawn today and your tests are completely normal, you will receive your results only by: MyChart Message (if you have MyChart) OR A paper copy in the mail If you have any lab test that is abnormal or we need to change your treatment, we will call you to review the results.   Follow-Up: At [facility], you and your health needs are our priority.  As part of our continuing mission to provide you with exceptional heart care, we have created designated Provider Care Teams.  These Care Teams include your primary Cardiologist (physician) and Advanced Practice Providers (APPs -  Physician Assistants and Nurse Practitioners) who all work together to provide you with the care you need, when you need it.  We recommend signing up for the patient portal called "MyChart".  Sign up information is provided on this After Visit Summary.  MyChart is used to connect with patients for Virtual Visits (Telemedicine).  Patients are able to view lab/test results, encounter notes, upcoming appointments, etc.  Non-urgent messages can be sent to your provider as well.   To learn more about what you can do with MyChart, go to [email or web address].    Your next appointment:    3-4 months with Dr. Rennis Golden

## 2023-05-25 NOTE — Progress Notes (Unsigned)
OFFICE NOTE  Chief Complaint:  Re-establish cardiology care  Primary Care Physician: Nelwyn Salisbury, MD  HPI:  Luke Skinner is a pleasant 67 year old male who was kindly referred to me by Dr. Clent Ridges for evaluation of a labile hypertension and dyspnea. He recently had changes in his blood pressure medicine including the addition of Toprol-XL 50 mg daily. He had reported some improvement in his blood pressures on this however was having problems with diastolic hypotension. His losartan was then decreased from 100 mg daily to 50 mg daily. Blood pressure then ran higher up in the 160s to 170 systolic. For a few days he took his wife's HCTZ and reported blood pressures in the 130s to 140 systolic. He has been having some dizziness and thinks this is related to blood pressure changes. He also reports some worsening fatigue and exercise intolerance, although denies any chest pain. He has difficult sleep at night and often times dozes off during the day. His Epworth sleepiness scale score was 10, suggesting the possibility of sleep apnea. He says that he had a sleep study in 2008 and this was negative for apnea, but did report that he does not get into REM sleep. An EKG in the office today is abnormal demonstrating anterolateral T-wave inversions in sinus bradycardia 55.  I saw Luke Skinner in the office today for follow-up of his stress test. This was negative for ischemia. Notably with the change in his medications including adding back his HCTZ, the blood pressure seems to be better controlled. He is currently on losartan 50 mg (one half tablet and (daily, HCTZ 25 mg daily and Toprol-XL 50 mg which she takes at night. He denies any worsening chest pain or shortness of breath.  Luke Skinner returns today for follow-up. We have further adjusted his blood pressure medicine and currently he is on the Hyzaar 10/25 and Toprol XL 50 mg daily. He reports his blood pressures at home range between 120 and 140  systolic over 60s. Overall he is feeling very well. Blood pressure today in the office was 130/72.  03/17/2017  Luke Skinner was seen in follow-up today. He reports some lability to his blood pressure but otherwise has been fairly well controlled. Today's 127/80. He is on metoprolol 50 mg daily at bedtime and he takes losartan HCTZ in the morning. He says that he notes sometimes in the morning he gets swimmy headed. Is not clearly associated with consistent medication use. He denies any chest pain or worsening shortness of breath. He does have some fatigue and is on treatment for low testosterone. He also inquired today about medications for erectile dysfunction. He says in the past he's used Viagra, though recently was given low-dose Revatio, but reports that after taking 4 tablets (80 mg) he felt his heart racing and was rather uncomfortable. I suspect this may be due to low blood pressure.  04/05/2018  Luke Skinner returns today for follow-up.  He occasionally is getting some swimmy headedness again.  There is some lability in his blood pressure and he notes is worse when he is not well-hydrated.  Blood pressure was 146/70 however repeat was 136/64.  Seems to be well controlled on his current medications.  Continues to struggle with erectile dysfunction but is followed by Dr. Marlou Porch for this.  PMHx:  Past Medical History:  Diagnosis Date   Diverticulosis of colon    left side   GERD (gastroesophageal reflux disease)    Hiatal hernia    History  of adenomatous polyp of colon    History of duodenal ulcer 2001   non-bleeding   History of kidney stones 2015   History of repair of tracheoesophageal fistula 1957   Hyperlipidemia, mixed    Hypertension    nuclear stress test in epic 08-05-2014  normal study no ischemic, nuclear ef 56%   Hypothyroidism    endocrinologist-- dr m. doerr;   dx age 34s   OA (osteoarthritis)    Renal calculus, right    S/P dilatation of esophageal stricture     followed by dr Marina Goodell;   last balloon dilation 12-31-2022;   hx multiple dilatation's    Past Surgical History:  Procedure Laterality Date   COLONOSCOPY WITH PROPOFOL  02/24/2021   per Dr. Marina Goodell, adenomatous polyp, repeat in 7 yrs   CYSTOSCOPY/URETEROSCOPY/HOLMIUM LASER/STENT PLACEMENT Right 03/19/2023   Procedure: CYSTOSCOPY RIGHT URETEROSCOPY, HOLMIUM LASER LITHOTRIPSY, RIGHT URETERAL STENT PLACEMENT;  Surgeon: Crist Fat, MD;  Location: [facility];  Service: Urology;  Laterality: Right;  100 MINUTES   ESOPHAGOGASTRODUODENOSCOPY (EGD) WITH ESOPHAGEAL DILATION  12/31/2022   dr Marina Goodell;   balloon dilatation   GASTROSTOMY  1989   per Dr. Gwendalyn Ege HERNIA REPAIR Left 1991   KNEE ARTHROSCOPY Left    1973;  1975;  1990;  2003   NASAL SEPTUM SURGERY  1992   SHOULDER ARTHROSCOPY W/ SUBACROMIAL DECOMPRESSION AND DISTAL CLAVICLE EXCISION Right 07/23/2004   @ [facility] by Dr. Lequita Halt;   and labral debridement   TOTAL HIP ARTHROPLASTY Right 10/21/2016   Procedure: RIGHT TOTAL HIP ARTHROPLASTY ANTERIOR APPROACH;  Surgeon: Ollen Gross, MD;  Location: [facility];  Service: Orthopedics;  Laterality: Right;   TOTAL HIP ARTHROPLASTY Left 10/07/2022   Procedure: TOTAL HIP ARTHROPLASTY ANTERIOR APPROACH;  Surgeon: Ollen Gross, MD;  Location: [facility];  Service: Orthopedics;  Laterality: Left;   TOTAL KNEE ARTHROPLASTY  08/26/2007   @ [facility]  Dr. Lequita Halt   TRACHEOESOPHAGEAL FISTULA REPAIR  1957   3 months old    FAMHx:  Family History  Problem Relation Age of Onset   Heart disease Mother    Thyroid disease Mother    Parkinsonism Father    Thyroid disease Sister    Diabetes Maternal Uncle    Colon cancer Neg Hx    Colon polyps Neg Hx    Esophageal cancer Neg Hx    Rectal cancer Neg Hx    Stomach cancer Neg Hx     SOCHx:   reports that he quit smoking about 24 years ago. His smoking use included cigarettes. He started smoking about 52 years ago. He has a 14 pack-year smoking  history. He has never used smokeless tobacco. He reports that he does not currently use alcohol. He reports that he does not use drugs.  ALLERGIES:  Allergies  Allergen Reactions   Shellfish Allergy     Per pt crabs  "feels weird"   Simvastatin     Joint pain.     Sulfa Antibiotics Hives    ROS: Pertinent items noted in HPI and remainder of comprehensive ROS otherwise negative.  HOME MEDS: Current Outpatient Medications  Medication Sig Dispense Refill   amoxicillin (AMOXIL) 500 MG tablet Take 2,000 mg by mouth See admin instructions. Takes before dental procedures     Ascorbic Acid (VITAMIN C) 1000 MG tablet Take 1,000 mg by mouth daily.     B Complex Vitamins (VITAMIN B COMPLEX PO) Take 1 tablet by mouth daily.  cyanocobalamin (VITAMIN B12) 1000 MCG tablet Take 1,000 mcg by mouth daily.     diphenhydrAMINE (BENADRYL) 25 MG tablet Take 25 mg by mouth at bedtime. For sleep.     ezetimibe (ZETIA) 10 MG tablet Take 1 tablet (10 mg total) by mouth daily. (Patient taking differently: Take 10 mg by mouth daily.) 100 tablet 3   hydrochlorothiazide (HYDRODIURIL) 25 MG tablet Take 1 tablet (25 mg total) by mouth daily. (Patient taking differently: Take 25 mg by mouth daily.) 100 tablet 3   HYDROmorphone (DILAUDID) 2 MG tablet Take 1 tablet (2 mg total) by mouth every 4 (four) hours as needed for severe pain. 30 tablet 0   ketoconazole (NIZORAL) 2 % cream Apply 1 Application topically 2 (two) times daily. (Patient taking differently: Apply 1 Application topically 2 (two) times daily as needed.) 30 g 5   levothyroxine (SYNTHROID) 50 MCG tablet Take 50 mcg by mouth daily before breakfast.     meloxicam (MOBIC) 15 MG tablet Take 1 tablet (15 mg total) by mouth daily. (Patient taking differently: Take 15 mg by mouth daily.) 100 tablet 3   methocarbamol (ROBAXIN) 500 MG tablet Take 1 tablet (500 mg total) by mouth every 6 (six) hours as needed for muscle spasms. 60 tablet 5   metoprolol succinate  (TOPROL-XL) 50 MG 24 hr tablet TAKE 1 TABLET BY MOUTH ONCE DAILY WITH  OR  IMMEDIATELY  FOLLOWING  A  MEAL. (Patient taking differently: Take 50 mg by mouth at bedtime. TAKE 1 TABLET BY MOUTH ONCE DAILY WITH  OR  IMMEDIATELY  FOLLOWING  A  MEAL.) 100 tablet 3   mometasone (ELOCON) 0.1 % cream Apply topically as needed. (Patient taking differently: Apply topically as needed (rash).) 45 g 11   Multiple Vitamins-Minerals (MENS MULTIVITAMIN PO) Take 1 tablet by mouth at bedtime.     pantoprazole (PROTONIX) 40 MG tablet Take 1 tablet (40 mg total) by mouth daily. 90 tablet 3   Potassium 99 MG TABS Take 99 mg by mouth at bedtime.     Potassium Gluconate 595 MG CAPS Take 595 mg by mouth at bedtime.     pyridoxine (B-6) 100 MG tablet Take 100 mg by mouth daily.     telmisartan (MICARDIS) 80 MG tablet Take 1 tablet (80 mg total) by mouth daily. (Patient taking differently: Take 80 mg by mouth daily.) 100 tablet 3   valACYclovir (VALTREX) 1000 MG tablet Take 1 tablet (1,000 mg total) by mouth 2 (two) times daily as needed (fever bloisters). (Patient taking differently: Take 1,000 mg by mouth 2 (two) times daily as needed (fever blisters).) 180 tablet 3   No current facility-administered medications for this visit.    LABS/IMAGING: No results found for this or any previous visit (from the past 48 hour(s)). No results found.  WEIGHTS: Wt Readings from Last 3 Encounters:  05/25/23 243 lb 9.6 oz (110.5 kg)  04/06/23 238 lb (108 kg)  04/02/23 240 lb (108.9 kg)    VITALS: BP (!) 164/80 (BP Location: Right Arm, Patient Position: Sitting, Cuff Size: Large)   Pulse 69   Ht 5\' 9"  (1.753 m)   Wt 243 lb 9.6 oz (110.5 kg)   SpO2 97%   BMI 35.97 kg/m   EXAM: General appearance: alert and no distress Neck: no carotid bruit and no JVD Lungs: clear to auscultation bilaterally Heart: regular rate and rhythm Abdomen: soft, non-tender; bowel sounds normal; no masses,  no organomegaly Extremities:  extremities normal, atraumatic, no cyanosis or  edema Pulses: 2+ and symmetric Skin: Skin color, texture, turgor normal. No rashes or lesions Neurologic: Grossly normal Psych: Mildly anxious  EKG: EKG Interpretation Date/Time:  Tuesday May 25 2023 13:10:41 EDT Ventricular Rate:  69 PR Interval:  162 QRS Duration:  114 QT Interval:  402 QTC Calculation: 430 R Axis:   6  Text Interpretation: Normal sinus rhythm Normal ECG When compared with ECG of 29-Sep-2022 09:23, No significant change was found Confirmed by Zoila Shutter [phone]) on 05/25/2023 1:16:45 PM    ASSESSMENT: Fatigue and dyspnea on exertion with abnormal ischemic EKG changes - low risk Myoview (07/2015) Labile hypertension - BP stable on current regimen High risk features for sleep apnea Erectile dysfunction  PLAN: 1.   Luke Skinner seems to be doing well with occasional episodes of lightheadedness.  Blood pressure is well controlled.  He struggles with erectile dysfunction but can follow-up with Dr. Marlou Porch for this with different options.  I have encouraged continued exercise and hydration during the summer.  If he struggles with more problems with dehydration then we could consider decreasing hydrochlorothiazide and increasing 1 of his other blood pressure medications to maintain his current good blood pressure control.  Follow-up annually or sooner as necessary.  Chrystie Nose, MD, [facility], FACP  [place]  [facility]  Medical Director of the [facility]  [facility] Diplomate of the American Board of Clinical Lipidology Attending Cardiologist  Direct Dial: [phone]  Fax: [phone]  Website:  [email or web address]  05/25/2023, 1:17 PM

## 2023-05-27 DIAGNOSIS — M25552 Pain in left hip: Secondary | ICD-10-CM | POA: Diagnosis not present

## 2023-05-27 DIAGNOSIS — Z96642 Presence of left artificial hip joint: Secondary | ICD-10-CM | POA: Diagnosis not present

## 2023-05-31 ENCOUNTER — Other Ambulatory Visit (HOSPITAL_COMMUNITY): Payer: Self-pay

## 2023-05-31 ENCOUNTER — Telehealth: Payer: Self-pay | Admitting: Family Medicine

## 2023-05-31 ENCOUNTER — Telehealth: Payer: Self-pay

## 2023-05-31 NOTE — Telephone Encounter (Signed)
Pt is calling and has an appt with dentist in dec 2024 and would like to have #12 amoxicillin 500 mg on hand for dental cleaning etc and also need a refill on hydrocodone 10-325 mg  [facility] - [address] [place] - [address] Phone: [phone]  Fax: [phone]

## 2023-05-31 NOTE — Telephone Encounter (Signed)
Pharmacy Patient Advocate Encounter   Received notification from Physician's Office/RN-JENNA E that prior authorization for NEXLETOL 180MG   is required/requested.   Insurance verification completed.   The patient is insured through [employer]/ [employer] .   Per test claim: PA required; PA submitted to [employer] via Fax Key/confirmation #/EOC [facility] Status is pending    P/A has been sent via email/fax

## 2023-06-01 ENCOUNTER — Other Ambulatory Visit (HOSPITAL_COMMUNITY): Payer: Self-pay

## 2023-06-01 MED ORDER — HYDROCODONE-ACETAMINOPHEN 10-325 MG PO TABS
1.0000 | ORAL_TABLET | ORAL | 0 refills | Status: DC | PRN
Start: 2023-06-01 — End: 2023-09-15

## 2023-06-01 MED ORDER — AMOXICILLIN 500 MG PO TABS: 2000.0000 mg | ORAL_TABLET | ORAL | 3 refills | Status: DC

## 2023-06-01 NOTE — Telephone Encounter (Signed)
Done

## 2023-06-01 NOTE — Telephone Encounter (Signed)
Pharmacy Patient Advocate Encounter  Received notification from [employer]/ [employer] that Prior Authorization for Nexletol 180MG  tablets has been APPROVED from 05/31/2023 to 12/01/2023. Ran test claim, Copay is $100.00. This test claim was processed through [facility]- copay amounts may vary at other pharmacies due to pharmacy/plan contracts, or as the patient moves through the different stages of their insurance plan.   PA #/Case ID/Reference #: [id number]

## 2023-06-01 NOTE — Telephone Encounter (Signed)
Update sent to patient via MyChart 

## 2023-06-09 NOTE — Telephone Encounter (Addendum)
Per pharmacy tech, Nexlizet is on tier 4 and PCSK9i is on tier 3  Hilty, Lisette Abu, MD  You2 days ago    Not sure PCSK9 would be cheaper, but we can investigate it if he wants to  -[place]

## 2023-06-10 NOTE — Telephone Encounter (Deleted)
Chrystie Nose, MD  You2 minutes ago (10:41 AM)    Can follow-up with me as needed then.  Dr Rexene Edison

## 2023-06-11 DIAGNOSIS — D4100 Neoplasm of uncertain behavior of unspecified kidney: Secondary | ICD-10-CM | POA: Diagnosis not present

## 2023-06-11 DIAGNOSIS — N200 Calculus of kidney: Secondary | ICD-10-CM | POA: Diagnosis not present

## 2023-06-11 DIAGNOSIS — Z96642 Presence of left artificial hip joint: Secondary | ICD-10-CM | POA: Diagnosis not present

## 2023-06-11 DIAGNOSIS — M25552 Pain in left hip: Secondary | ICD-10-CM | POA: Diagnosis not present

## 2023-07-08 ENCOUNTER — Ambulatory Visit (INDEPENDENT_AMBULATORY_CARE_PROVIDER_SITE_OTHER): Payer: PPO | Admitting: Family Medicine

## 2023-07-08 ENCOUNTER — Encounter: Payer: Self-pay | Admitting: Family Medicine

## 2023-07-08 VITALS — BP 126/66 | HR 69 | Temp 97.9°F | Ht 69.0 in | Wt 239.6 lb

## 2023-07-08 DIAGNOSIS — L729 Follicular cyst of the skin and subcutaneous tissue, unspecified: Secondary | ICD-10-CM | POA: Diagnosis not present

## 2023-07-08 DIAGNOSIS — L089 Local infection of the skin and subcutaneous tissue, unspecified: Secondary | ICD-10-CM | POA: Diagnosis not present

## 2023-07-08 MED ORDER — DOXYCYCLINE HYCLATE 100 MG PO TABS
100.0000 mg | ORAL_TABLET | Freq: Two times a day (BID) | ORAL | 0 refills | Status: AC
Start: 2023-07-08 — End: 2023-07-18

## 2023-07-08 NOTE — Progress Notes (Signed)
Established Patient Office Visit   Subjective:  Patient ID: Luke Skinner, male    DOB: 1955-12-04  Age: 67 y.o. MRN: [id number]  Chief Complaint  Patient presents with   Mass    Bump on groin x 2 days.     HPI Encounter Diagnoses  Name Primary?   Infected cyst of skin Yes   2 to 3-day history of a tender swelling in his groin injury.  No injury.  No recent history.  It is not drained.  There has been no fever or chills.   Review of Systems  Constitutional: Negative.   HENT: Negative.    Eyes:  Negative for blurred vision, discharge and redness.  Respiratory: Negative.    Cardiovascular: Negative.   Gastrointestinal:  Negative for abdominal pain.  Genitourinary: Negative.   Musculoskeletal: Negative.  Negative for myalgias.  Skin:  Negative for rash.  Neurological:  Negative for tingling, loss of consciousness and weakness.  Endo/Heme/Allergies:  Negative for polydipsia.     Current Outpatient Medications:    doxycycline (VIBRA-TABS) 100 MG tablet, Take 1 tablet (100 mg total) by mouth 2 (two) times daily for 10 days., Disp: 20 tablet, Rfl: 0   Ascorbic Acid (VITAMIN C) 1000 MG tablet, Take 1,000 mg by mouth daily., Disp: , Rfl:    B Complex Vitamins (VITAMIN B COMPLEX PO), Take 1 tablet by mouth daily., Disp: , Rfl:    cyanocobalamin (VITAMIN B12) 1000 MCG tablet, Take 1,000 mcg by mouth daily., Disp: , Rfl:    diphenhydrAMINE (BENADRYL) 25 MG tablet, Take 25 mg by mouth at bedtime. For sleep., Disp: , Rfl:    ezetimibe (ZETIA) 10 MG tablet, Take 1 tablet (10 mg total) by mouth daily. (Patient taking differently: Take 10 mg by mouth daily.), Disp: 100 tablet, Rfl: 3   hydrochlorothiazide (HYDRODIURIL) 25 MG tablet, Take 1 tablet (25 mg total) by mouth daily. (Patient taking differently: Take 25 mg by mouth daily.), Disp: 100 tablet, Rfl: 3   HYDROcodone-acetaminophen (NORCO) 10-325 MG tablet, Take 1 tablet by mouth every 4 (four) hours as needed., Disp: 30 tablet, Rfl:  0   HYDROmorphone (DILAUDID) 2 MG tablet, Take 1 tablet (2 mg total) by mouth every 4 (four) hours as needed for severe pain., Disp: 30 tablet, Rfl: 0   ketoconazole (NIZORAL) 2 % cream, Apply 1 Application topically 2 (two) times daily. (Patient taking differently: Apply 1 Application topically 2 (two) times daily as needed.), Disp: 30 g, Rfl: 5   levothyroxine (SYNTHROID) 50 MCG tablet, Take 50 mcg by mouth daily before breakfast., Disp: , Rfl:    meloxicam (MOBIC) 15 MG tablet, Take 1 tablet (15 mg total) by mouth daily. (Patient taking differently: Take 15 mg by mouth daily.), Disp: 100 tablet, Rfl: 3   methocarbamol (ROBAXIN) 500 MG tablet, Take 1 tablet (500 mg total) by mouth every 6 (six) hours as needed for muscle spasms., Disp: 60 tablet, Rfl: 5   metoprolol succinate (TOPROL-XL) 50 MG 24 hr tablet, TAKE 1 TABLET BY MOUTH ONCE DAILY WITH  OR  IMMEDIATELY  FOLLOWING  A  MEAL. (Patient taking differently: Take 50 mg by mouth at bedtime. TAKE 1 TABLET BY MOUTH ONCE DAILY WITH  OR  IMMEDIATELY  FOLLOWING  A  MEAL.), Disp: 100 tablet, Rfl: 3   mometasone (ELOCON) 0.1 % cream, Apply topically as needed. (Patient taking differently: Apply topically as needed (rash).), Disp: 45 g, Rfl: 11   Multiple Vitamins-Minerals (MENS MULTIVITAMIN PO), Take 1 tablet  by mouth at bedtime., Disp: , Rfl:    pantoprazole (PROTONIX) 40 MG tablet, Take 1 tablet (40 mg total) by mouth daily., Disp: 90 tablet, Rfl: 3   Potassium 99 MG TABS, Take 99 mg by mouth at bedtime., Disp: , Rfl:    Potassium Gluconate 595 MG CAPS, Take 595 mg by mouth at bedtime., Disp: , Rfl:    pyridoxine (B-6) 100 MG tablet, Take 100 mg by mouth daily., Disp: , Rfl:    telmisartan (MICARDIS) 80 MG tablet, Take 1 tablet (80 mg total) by mouth daily. (Patient taking differently: Take 80 mg by mouth daily.), Disp: 100 tablet, Rfl: 3   valACYclovir (VALTREX) 1000 MG tablet, Take 1 tablet (1,000 mg total) by mouth 2 (two) times daily as needed  (fever bloisters). (Patient taking differently: Take 1,000 mg by mouth 2 (two) times daily as needed (fever blisters).), Disp: 180 tablet, Rfl: 3   Objective:     BP 126/66   Pulse 69   Temp 97.9 F (36.6 C)   Ht 5\' 9"  (1.753 m)   Wt 239 lb 9.6 oz (108.7 kg)   SpO2 97%   BMI 35.38 kg/m    Physical Exam Constitutional:      General: He is not in acute distress.    Appearance: Normal appearance. He is not ill-appearing, toxic-appearing or diaphoretic.  HENT:     Head: Normocephalic and atraumatic.     Right Ear: External ear normal.     Left Ear: External ear normal.  Eyes:     General: No scleral icterus.       Right eye: No discharge.        Left eye: No discharge.     Extraocular Movements: Extraocular movements intact.     Conjunctiva/sclera: Conjunctivae normal.  Pulmonary:     Effort: Pulmonary effort is normal. No respiratory distress.  Skin:    General: Skin is warm and dry.       Neurological:     Mental Status: He is alert and oriented to person, place, and time.  Psychiatric:        Mood and Affect: Mood normal.        Behavior: Behavior normal.      No results found for any visits on 07/08/23.    The 10-year ASCVD risk score (Arnett DK, et al., 2019) is: 15.2%    Assessment & Plan:   Infected cyst of skin -     Doxycycline Hyclate; Take 1 tablet (100 mg total) by mouth 2 (two) times daily for 10 days.  Dispense: 20 tablet; Refill: 0    Return if symptoms worsen or fail to improve.  Sun precautions with Doxy. Mliss Sax, MD

## 2023-09-10 ENCOUNTER — Telehealth: Payer: Self-pay | Admitting: Family Medicine

## 2023-09-10 NOTE — Telephone Encounter (Signed)
Prescription Request  09/10/2023  LOV: 04/06/2023  What is the name of the medication or equipment? HYDROcodone-acetaminophen (NORCO) 10-325 MG tablet   Have you contacted your pharmacy to request a refill? No   Which pharmacy would you like this sent to?  [facility] [address] [place] - [address] [place] [address] Phone: [phone] Fax: [phone]    Patient notified that their request is being sent to the clinical staff for review and that they should receive a response within 2 business days.   Please advise at Mobile [phone] (mobile)

## 2023-09-15 MED ORDER — HYDROCODONE-ACETAMINOPHEN 10-325 MG PO TABS: 1.0000 | ORAL_TABLET | ORAL | 0 refills | Status: DC | PRN

## 2023-09-15 NOTE — Telephone Encounter (Signed)
Done

## 2023-09-15 NOTE — Addendum Note (Signed)
Addended by: Gershon Crane A on: 09/15/2023 05:06 PM   Modules accepted: Orders

## 2023-10-06 DIAGNOSIS — L814 Other melanin hyperpigmentation: Secondary | ICD-10-CM | POA: Diagnosis not present

## 2023-10-06 DIAGNOSIS — Z08 Encounter for follow-up examination after completed treatment for malignant neoplasm: Secondary | ICD-10-CM | POA: Diagnosis not present

## 2023-10-06 DIAGNOSIS — Z872 Personal history of diseases of the skin and subcutaneous tissue: Secondary | ICD-10-CM | POA: Diagnosis not present

## 2023-10-06 DIAGNOSIS — Z85828 Personal history of other malignant neoplasm of skin: Secondary | ICD-10-CM | POA: Diagnosis not present

## 2023-10-06 DIAGNOSIS — L821 Other seborrheic keratosis: Secondary | ICD-10-CM | POA: Diagnosis not present

## 2023-10-06 DIAGNOSIS — L570 Actinic keratosis: Secondary | ICD-10-CM | POA: Diagnosis not present

## 2023-10-06 DIAGNOSIS — D225 Melanocytic nevi of trunk: Secondary | ICD-10-CM | POA: Diagnosis not present

## 2023-10-07 ENCOUNTER — Ambulatory Visit: Payer: PPO | Admitting: Internal Medicine

## 2023-10-08 DIAGNOSIS — Z96642 Presence of left artificial hip joint: Secondary | ICD-10-CM | POA: Diagnosis not present

## 2023-10-18 DIAGNOSIS — M79672 Pain in left foot: Secondary | ICD-10-CM | POA: Diagnosis not present

## 2023-12-21 ENCOUNTER — Other Ambulatory Visit: Payer: Self-pay | Admitting: Family Medicine

## 2023-12-21 DIAGNOSIS — N200 Calculus of kidney: Secondary | ICD-10-CM | POA: Diagnosis not present

## 2023-12-21 MED ORDER — HYDROCODONE-ACETAMINOPHEN 10-325 MG PO TABS: 1.0000 | ORAL_TABLET | ORAL | 0 refills | Status: DC | PRN

## 2023-12-21 NOTE — Telephone Encounter (Signed)
 Last Fill: 09/15/23 30 tabs/0 RF  Last OV: 07/08/23 Next OV: 02/23/24  Routing to provider for review/authorization.

## 2023-12-21 NOTE — Telephone Encounter (Signed)
 Copied from CRM [id number]. Topic: Clinical - Medication Refill >> Dec 21, 2023 10:57 AM Florestine Avers wrote: Most Recent Primary Care Visit:  Provider: Mliss Sax  Department: [facility]  Visit Type: OFFICE VISIT  Date: 07/08/2023  Medication: HYDROcodone-acetaminophen (NORCO) 10-325 MG tablet  Has the patient contacted their pharmacy? Yes (Agent: If no, request that the patient contact the pharmacy for the refill. If patient does not wish to contact the pharmacy document the reason why and proceed with request.) (Agent: If yes, when and what did the pharmacy advise?)  Is this the correct pharmacy for this prescription? Yes If no, delete pharmacy and type the correct one.  This is the patient's preferred pharmacy:  [facility] [address] [place] - [address] [place] [address] Phone: [phone] Fax: [phone]   Has the prescription been filled recently? Yes  Is the patient out of the medication? Yes  Has the patient been seen for an appointment in the last year OR does the patient have an upcoming appointment? Yes  Can we respond through MyChart? Yes  Agent: Please be advised that Rx refills may take up to 3 business days. We ask that you follow-up with your pharmacy.

## 2024-01-10 DIAGNOSIS — H2513 Age-related nuclear cataract, bilateral: Secondary | ICD-10-CM | POA: Diagnosis not present

## 2024-01-10 DIAGNOSIS — M25511 Pain in right shoulder: Secondary | ICD-10-CM | POA: Diagnosis not present

## 2024-02-08 DIAGNOSIS — D044 Carcinoma in situ of skin of scalp and neck: Secondary | ICD-10-CM | POA: Diagnosis not present

## 2024-02-08 DIAGNOSIS — L570 Actinic keratosis: Secondary | ICD-10-CM | POA: Diagnosis not present

## 2024-02-23 ENCOUNTER — Encounter: Payer: Self-pay | Admitting: Family Medicine

## 2024-02-23 ENCOUNTER — Ambulatory Visit (INDEPENDENT_AMBULATORY_CARE_PROVIDER_SITE_OTHER): Payer: PPO | Admitting: Family Medicine

## 2024-02-23 ENCOUNTER — Ambulatory Visit: Payer: PPO

## 2024-02-23 VITALS — BP 126/64 | HR 63 | Temp 98.3°F | Ht 69.0 in | Wt 234.6 lb

## 2024-02-23 DIAGNOSIS — E039 Hypothyroidism, unspecified: Secondary | ICD-10-CM

## 2024-02-23 DIAGNOSIS — Z136 Encounter for screening for cardiovascular disorders: Secondary | ICD-10-CM

## 2024-02-23 DIAGNOSIS — Z0000 Encounter for general adult medical examination without abnormal findings: Secondary | ICD-10-CM | POA: Diagnosis not present

## 2024-02-23 LAB — LIPID PANEL
Cholesterol: 167 mg/dL (ref 0–200)
HDL: 43.6 mg/dL (ref >39.00)
LDL Cholesterol: 91 mg/dL (ref 0–99)
NonHDL: 123.54
Total CHOL/HDL Ratio: 4
Triglycerides: 162 mg/dL — ABNORMAL HIGH (ref 0.0–149.0)
VLDL: 32.4 mg/dL (ref 0.0–40.0)

## 2024-02-23 LAB — CBC WITH DIFFERENTIAL/PLATELET
Basophils Absolute: 0 10*3/uL (ref 0.0–0.1)
Basophils Relative: 0.5 % (ref 0.0–3.0)
Eosinophils Absolute: 0.1 10*3/uL (ref 0.0–0.7)
Eosinophils Relative: 1.8 % (ref 0.0–5.0)
HCT: 41.3 % (ref 39.0–52.0)
Hemoglobin: 13.6 g/dL (ref 13.0–17.0)
Lymphocytes Relative: 30.2 % (ref 12.0–46.0)
Lymphs Abs: 1.6 10*3/uL (ref 0.7–4.0)
MCHC: 32.8 g/dL (ref 30.0–36.0)
MCV: 85.2 fl (ref 78.0–100.0)
Monocytes Absolute: 0.7 10*3/uL (ref 0.1–1.0)
Monocytes Relative: 12.8 % — ABNORMAL HIGH (ref 3.0–12.0)
Neutro Abs: 2.9 10*3/uL (ref 1.4–7.7)
Neutrophils Relative %: 54.7 % (ref 43.0–77.0)
Platelets: 246 10*3/uL (ref 150.0–400.0)
RBC: 4.85 Mil/uL (ref 4.22–5.81)
RDW: 13.3 % (ref 11.5–15.5)
WBC: 5.4 10*3/uL (ref 4.0–10.5)

## 2024-02-23 LAB — BASIC METABOLIC PANEL WITH GFR
BUN: 18 mg/dL (ref 6–23)
CO2: 27 meq/L (ref 19–32)
Calcium: 10.4 mg/dL (ref 8.4–10.5)
Chloride: 102 meq/L (ref 96–112)
Creatinine, Ser: 1.02 mg/dL (ref 0.40–1.50)
GFR: 75.7 mL/min (ref >60.00)
Glucose, Bld: 122 mg/dL — ABNORMAL HIGH (ref 70–99)
Potassium: 4.9 meq/L (ref 3.5–5.1)
Sodium: 141 meq/L (ref 135–145)

## 2024-02-23 LAB — HEPATIC FUNCTION PANEL
ALT: 25 U/L (ref 0–53)
AST: 22 U/L (ref 0–37)
Albumin: 4.3 g/dL (ref 3.5–5.2)
Alkaline Phosphatase: 92 U/L (ref 39–117)
Bilirubin, Direct: 0.2 mg/dL (ref 0.0–0.3)
Total Bilirubin: 0.9 mg/dL (ref 0.2–1.2)
Total Protein: 7 g/dL (ref 6.0–8.3)

## 2024-02-23 LAB — HEMOGLOBIN A1C: Hgb A1c MFr Bld: 6.3 % (ref 4.6–6.5)

## 2024-02-23 LAB — T3, FREE: T3, Free: 4.9 pg/mL — ABNORMAL HIGH (ref 2.3–4.2)

## 2024-02-23 LAB — T4, FREE: Free T4: 1.51 ng/dL (ref 0.60–1.60)

## 2024-02-23 LAB — PSA: PSA: 2.63 ng/mL (ref 0.10–4.00)

## 2024-02-23 LAB — TSH: TSH: 0 u[IU]/mL — ABNORMAL LOW (ref 0.35–5.50)

## 2024-02-23 MED ORDER — MELOXICAM 15 MG PO TABS: 15.0000 mg | ORAL_TABLET | Freq: Every day | ORAL | 3 refills | Status: AC

## 2024-02-23 MED ORDER — HYDROCHLOROTHIAZIDE 25 MG PO TABS: 25.0000 mg | ORAL_TABLET | Freq: Every day | ORAL | 3 refills | Status: AC

## 2024-02-23 MED ORDER — HYDROCODONE-ACETAMINOPHEN 10-325 MG PO TABS: 1.0000 | ORAL_TABLET | ORAL | 0 refills | Status: DC | PRN

## 2024-02-23 MED ORDER — METOPROLOL SUCCINATE ER 50 MG PO TB24: ORAL_TABLET | ORAL | 3 refills | Status: AC

## 2024-02-23 MED ORDER — EZETIMIBE 10 MG PO TABS: 10.0000 mg | ORAL_TABLET | Freq: Every day | ORAL | 3 refills | Status: DC

## 2024-02-23 MED ORDER — TELMISARTAN 80 MG PO TABS: 80.0000 mg | ORAL_TABLET | Freq: Every day | ORAL | 3 refills | Status: AC

## 2024-02-23 MED ORDER — PANTOPRAZOLE SODIUM 40 MG PO TBEC: 40.0000 mg | DELAYED_RELEASE_TABLET | Freq: Every day | ORAL | 3 refills | Status: DC

## 2024-02-23 NOTE — Progress Notes (Signed)
 Subjective:    Patient ID: Luke Skinner, male    DOB: 03/18/1956, 68 y.o.   MRN: [id number]  HPI Here for a well exam. He feels fine. He had been seeing Dr. Laurence Pons (endocrinology) for his hypothyroidism, but he wants us  to manage this instead. He saw Dr. Dulcy Gibney recently, and Xrays revealed no kidney stones.    Review of Systems  Constitutional: Negative.   HENT: Negative.    Eyes: Negative.   Respiratory: Negative.    Cardiovascular: Negative.   Gastrointestinal: Negative.   Genitourinary: Negative.   Musculoskeletal: Negative.   Skin: Negative.   Neurological: Negative.   Psychiatric/Behavioral: Negative.         Objective:   Physical Exam Constitutional:      General: He is not in acute distress.    Appearance: Normal appearance. He is well-developed. He is not diaphoretic.  HENT:     Head: Normocephalic and atraumatic.     Right Ear: External ear normal.     Left Ear: External ear normal.     Nose: Nose normal.     Mouth/Throat:     Pharynx: No oropharyngeal exudate.  Eyes:     General: No scleral icterus.       Right eye: No discharge.        Left eye: No discharge.     Conjunctiva/sclera: Conjunctivae normal.     Pupils: Pupils are equal, round, and reactive to light.  Neck:     Thyroid : No thyromegaly.     Vascular: No JVD.     Trachea: No tracheal deviation.  Cardiovascular:     Rate and Rhythm: Normal rate and regular rhythm.     Pulses: Normal pulses.     Heart sounds: Normal heart sounds. No murmur heard.    No friction rub. No gallop.  Pulmonary:     Effort: Pulmonary effort is normal. No respiratory distress.     Breath sounds: Normal breath sounds. No wheezing or rales.  Chest:     Chest wall: No tenderness.  Abdominal:     General: Bowel sounds are normal. There is no distension.     Palpations: Abdomen is soft. There is no mass.     Tenderness: There is no abdominal tenderness. There is no guarding or rebound.  Genitourinary:     Penis: Normal. No tenderness.      Testes: Normal.     Prostate: Normal.     Rectum: Normal. Guaiac result negative.  Musculoskeletal:        General: No tenderness. Normal range of motion.     Cervical back: Neck supple.  Lymphadenopathy:     Cervical: No cervical adenopathy.  Skin:    General: Skin is warm and dry.     Coloration: Skin is not pale.     Findings: No erythema or rash.  Neurological:     General: No focal deficit present.     Mental Status: He is alert and oriented to person, place, and time.     Cranial Nerves: No cranial nerve deficit.     Motor: No abnormal muscle tone.     Coordination: Coordination normal.     Deep Tendon Reflexes: Reflexes are normal and symmetric. Reflexes normal.  Psychiatric:        Mood and Affect: Mood normal.        Behavior: Behavior normal.        Thought Content: Thought content normal.  Judgment: Judgment normal.           Assessment & Plan:  Well exam. We discussed diet and exercise. Get fasting labs, including TSH and free T3 and free T4. Per his request, we will order a cardiac CT calcium screen.  Corita Diego, MD

## 2024-02-25 MED ORDER — LEVOTHYROXINE SODIUM 25 MCG PO TABS
25.0000 ug | ORAL_TABLET | Freq: Every day | ORAL | 0 refills | Status: DC
Start: 2024-02-25 — End: 2024-06-06

## 2024-02-25 NOTE — Addendum Note (Signed)
 Addended by: Henry Loge on: 02/25/2024 09:58 AM   Modules accepted: Orders

## 2024-04-05 ENCOUNTER — Ambulatory Visit: Payer: PPO

## 2024-04-05 VITALS — Ht 69.0 in | Wt 234.0 lb

## 2024-04-05 DIAGNOSIS — Z0000 Encounter for general adult medical examination without abnormal findings: Secondary | ICD-10-CM

## 2024-04-05 NOTE — Patient Instructions (Addendum)
 Luke Skinner , Thank you for taking time out of your busy schedule to complete your Annual Wellness Visit with me. I enjoyed our conversation and look forward to speaking with you again next year. I, as well as your care team,  appreciate your ongoing commitment to your health goals. Please review the following plan we discussed and let me know if I can assist you in the future. Your Game plan/ To Do List    Referrals: If you haven't heard from the office you've been referred to, please reach out to them at the phone provided.   Follow up Visits: Next Medicare AWV with our clinical staff: 04/11/25 @ 3:40p   Have you seen your provider in the last 6 months (3 months if uncontrolled diabetes)? 02/23/24 Next Office Visit with your provider:   Clinician Recommendations:  Aim for 30 minutes of exercise or brisk walking, 6-8 glasses of water , and 5 servings of fruits and vegetables each day.       This is a list of the screening recommended for you and due dates:  Health Maintenance  Topic Date Due   Pneumococcal Vaccine for age over 47 (3 of 3 - PCV20 or PCV21) 07/17/2020   COVID-19 Vaccine (6 - 2024-25 season) 06/20/2023   DTaP/Tdap/Td vaccine (4 - Td or Tdap) 01/13/2024   Flu Shot  05/19/2024   Medicare Annual Wellness Visit  04/05/2025   Colon Cancer Screening  02/25/2028   Hepatitis C Screening  Completed   Zoster (Shingles) Vaccine  Completed   HPV Vaccine  Aged Out   Meningitis B Vaccine  Aged Out    Advanced directives: (Copy Requested) Please bring a copy of your health care power of attorney and living will to the office to be added to your chart at your convenience. You can mail to [facility] [address] [place], [place] [address] or email to [email or web address] Advance Care Planning is important because it:  [x]  Makes sure you receive the medical care that is consistent with your values, goals, and preferences  [x]  It provides guidance to your family  and loved ones and reduces their decisional burden about whether or not they are making the right decisions based on your wishes.  Follow the link provided in your after visit summary or read over the paperwork we have mailed to you to help you started getting your Advance Directives in place. If you need assistance in completing these, please reach out to us  so that we can help you!  See attachments for Preventive Care and Fall Prevention Tips.

## 2024-04-05 NOTE — Progress Notes (Signed)
 Subjective:   Luke Skinner is a 68 y.o. who presents for a Medicare Wellness preventive visit.  As a reminder, Annual Wellness Visits don't include a physical exam, and some assessments may be limited, especially if this visit is performed virtually. We may recommend an in-person follow-up visit with your provider if needed.  Visit Complete: Virtual I connected with  Luke Skinner on 04/05/24 by a audio enabled telemedicine application and verified that I am speaking with the correct person using two identifiers.  Patient Location: Home  Provider Location: Home Office  I discussed the limitations of evaluation and management by telemedicine. The patient expressed understanding and agreed to proceed.  Vital Signs: Because this visit was a virtual/telehealth visit, some criteria may be missing or patient reported. Any vitals not documented were not able to be obtained and vitals that have been documented are patient reported.    Persons Participating in Visit: Patient.  AWV Questionnaire: No: Patient Medicare AWV questionnaire was not completed prior to this visit.  Cardiac Risk Factors include: advanced age (>44men, >34 women);male gender;hypertension     Objective:    Today's Vitals   04/05/24 1524  Weight: 234 lb (106.1 kg)  Height: 5' 9 (1.753 m)   Body mass index is 34.56 kg/m.     04/05/2024    3:30 PM 04/02/2023    2:51 PM 03/19/2023    7:55 AM 10/07/2022    3:34 PM 09/29/2022    8:55 AM 02/23/2022    8:17 AM 10/21/2016   11:30 AM  Advanced Directives  Does Patient Have a Medical Advance Directive? Yes Yes No Yes Yes Yes Yes   Type of Estate agent of [place];Living will Healthcare Power of [place];Living will  Healthcare Power of [place];Living will Healthcare Power of [place];Living will Healthcare Power of [place];Living will Healthcare Power of [place];Living will  Does patient want to make changes to medical advance directive?     No - Patient declined   No - Patient declined   Copy of Healthcare Power of Attorney in Chart? No - copy requested No - copy requested  No - copy requested  No - copy requested No - copy requested   Would patient like information on creating a medical advance directive?   No - Patient declined         Data saved with a previous flowsheet row definition    Current Medications (verified) Outpatient Encounter Medications as of 04/05/2024  Medication Sig   Ascorbic Acid (VITAMIN C) 1000 MG tablet Take 1,000 mg by mouth daily.   B Complex Vitamins (VITAMIN B COMPLEX PO) Take 1 tablet by mouth daily.   cyanocobalamin  (VITAMIN B12) 1000 MCG tablet Take 1,000 mcg by mouth daily.   diphenhydrAMINE  (BENADRYL ) 25 MG tablet Take 25 mg by mouth at bedtime. For sleep.   ezetimibe  (ZETIA ) 10 MG tablet Take 1 tablet (10 mg total) by mouth daily.   hydrochlorothiazide  (HYDRODIURIL ) 25 MG tablet Take 1 tablet (25 mg total) by mouth daily.   HYDROcodone -acetaminophen  (NORCO) 10-325 MG tablet Take 1 tablet by mouth every 4 (four) hours as needed.   ketoconazole  (NIZORAL ) 2 % cream Apply 1 Application topically 2 (two) times daily. (Patient taking differently: Apply 1 Application topically 2 (two) times daily as needed.)   levothyroxine  (SYNTHROID ) 25 MCG tablet Take 1 tablet (25 mcg total) by mouth daily.   meloxicam  (MOBIC ) 15 MG tablet Take 1 tablet (15 mg total) by mouth daily.   methocarbamol  (  ROBAXIN ) 500 MG tablet Take 1 tablet (500 mg total) by mouth every 6 (six) hours as needed for muscle spasms.   metoprolol  succinate (TOPROL -XL) 50 MG 24 hr tablet TAKE 1 TABLET BY MOUTH ONCE DAILY WITH  OR  IMMEDIATELY  FOLLOWING  A  MEAL.   mometasone  (ELOCON ) 0.1 % cream Apply topically as needed. (Patient taking differently: Apply topically as needed (rash).)   Multiple Vitamins-Minerals (MENS MULTIVITAMIN PO) Take 1 tablet by mouth at bedtime.   pantoprazole  (PROTONIX ) 40 MG tablet Take 1 tablet (40 mg total) by  mouth daily.   Potassium 99 MG TABS Take 99 mg by mouth at bedtime.   Potassium Gluconate 595 MG CAPS Take 595 mg by mouth at bedtime.   pyridoxine (B-6) 100 MG tablet Take 100 mg by mouth daily.   telmisartan  (MICARDIS ) 80 MG tablet Take 1 tablet (80 mg total) by mouth daily.   valACYclovir  (VALTREX ) 1000 MG tablet Take 1 tablet (1,000 mg total) by mouth 2 (two) times daily as needed (fever bloisters). (Patient taking differently: Take 1,000 mg by mouth 2 (two) times daily as needed (fever blisters).)   No facility-administered encounter medications on file as of 04/05/2024.    Allergies (verified) Shellfish allergy, Simvastatin , and Sulfa antibiotics   History: Past Medical History:  Diagnosis Date   Diverticulosis of colon    left side   GERD (gastroesophageal reflux disease)    Hiatal hernia    History of adenomatous polyp of colon    History of duodenal ulcer 2001   non-bleeding   History of kidney stones 2015   History of repair of tracheoesophageal fistula 1957   Hyperlipidemia, mixed    Hypertension    nuclear stress test in epic 08-05-2014  normal study no ischemic, nuclear ef 56%   Hypothyroidism    endocrinologist-- dr m. doerr;   dx age 56s   OA (osteoarthritis)    Renal calculus, right    S/P dilatation of esophageal stricture    followed by dr Elvin Hammer;   last balloon dilation 12-31-2022;   hx multiple dilatation's   Past Surgical History:  Procedure Laterality Date   COLONOSCOPY WITH PROPOFOL   02/24/2021   per Dr. Elvin Hammer, adenomatous polyp, repeat in 7 yrs   CYSTOSCOPY/URETEROSCOPY/HOLMIUM LASER/STENT PLACEMENT Right 03/19/2023   Procedure: CYSTOSCOPY RIGHT URETEROSCOPY, HOLMIUM LASER LITHOTRIPSY, RIGHT URETERAL STENT PLACEMENT;  Surgeon: Andrez Banker, MD;  Location[facility];  Service: Urology;  Laterality: Right;  100 MINUTES   ESOPHAGOGASTRODUODENOSCOPY (EGD) WITH ESOPHAGEAL DILATION  12/31/2022   dr Elvin Hammer;   balloon dilatation    GASTROSTOMY  1989   per Dr. Coral Der HERNIA REPAIR Left 1991   KNEE ARTHROSCOPY Left    1973;  1975;  1990;  2003   NASAL SEPTUM SURGERY  1992   SHOULDER ARTHROSCOPY W/ SUBACROMIAL DECOMPRESSION AND DISTAL CLAVICLE EXCISION Right 07/23/2004   @ [facility] by Dr. France Ina;   and labral debridement   TOTAL HIP ARTHROPLASTY Right 10/21/2016   Procedure: RIGHT TOTAL HIP ARTHROPLASTY ANTERIOR APPROACH;  Surgeon: Liliane Rei, MD;  Location: [facility];  Service: Orthopedics;  Laterality: Right;   TOTAL HIP ARTHROPLASTY Left 10/07/2022   Procedure: TOTAL HIP ARTHROPLASTY ANTERIOR APPROACH;  Surgeon: Liliane Rei, MD;  Location: [facility];  Service: Orthopedics;  Laterality: Left;   TOTAL KNEE ARTHROPLASTY  08/26/2007   @ [facility]  Dr. France Ina   TRACHEOESOPHAGEAL FISTULA REPAIR  1957   3 months old   Family History  Problem Relation Age of Onset   Heart disease Mother    Thyroid  disease Mother    Parkinsonism Father    Thyroid  disease Sister    Diabetes Maternal Uncle    Colon cancer Neg Hx    Colon polyps Neg Hx    Esophageal cancer Neg Hx    Rectal cancer Neg Hx    Stomach cancer Neg Hx    Social History   Socioeconomic History   Marital status: Married    Spouse name: Not on file   Number of children: Not on file   Years of education: 12   Highest education level: 12th grade  Occupational History   Not on file  Tobacco Use   Smoking status: Former    Current packs/day: 0.00    Average packs/day: 0.5 packs/day for 28.0 years (14.0 ttl pk-yrs)    Types: Cigarettes    Start date: 66    Quit date: 2000    Years since quitting: 25.4   Smokeless tobacco: Never  Vaping Use   Vaping status: Never Used  Substance and Sexual Activity   Alcohol use: Not Currently   Drug use: Never   Sexual activity: Yes  Other Topics Concern   Not on file  Social History Narrative   Epworth Sleepiness Scale = 10 (07/22/15)   Social Drivers of Health   Financial Resource Strain: Low Risk   (04/05/2024)   Overall Financial Resource Strain (CARDIA)    Difficulty of Paying Living Expenses: Not hard at all  Food Insecurity: No Food Insecurity (04/05/2024)   Hunger Vital Sign    Worried About Running Out of Food in the Last Year: Never true    Ran Out of Food in the Last Year: Never true  Transportation Needs: No Transportation Needs (04/05/2024)   PRAPARE - Administrator, Civil Service (Medical): No    Lack of Transportation (Non-Medical): No  Physical Activity: Sufficiently Active (04/05/2024)   Exercise Vital Sign    Days of Exercise per Week: 3 days    Minutes of Exercise per Session: 60 min  Stress: No Stress Concern Present (04/05/2024)   [employer] of Occupational Health - Occupational Stress Questionnaire    Feeling of Stress: Not at all  Social Connections: Moderately Isolated (04/05/2024)   Social Connection and Isolation Panel    Frequency of Communication with Friends and Family: More than three times a week    Frequency of Social Gatherings with Friends and Family: More than three times a week    Attends Religious Services: Never    Database administrator or Organizations: No    Attends Engineer, structural: Never    Marital Status: Married    Tobacco Counseling Counseling given: Not Answered    Clinical Intake:  Pre-visit preparation completed: Yes  Pain : No/denies pain     BMI - recorded: 34.56 Nutritional Status: BMI > 30  Obese Nutritional Risks: None Diabetes: No  Lab Results  Component Value Date   HGBA1C 6.3 02/23/2024   HGBA1C 6.5 02/09/2023   HGBA1C 6.3 01/14/2022     How often do you need to have someone help you when you read instructions, pamphlets, or other written materials from your doctor or pharmacy?: 1 - Never  Interpreter Needed?: No  Information entered by :: Farris Hong LPN   Activities of Daily Living     04/05/2024    3:29 PM  In your present state of health, do you have  any  difficulty performing the following activities:  Hearing? 0  Vision? 0  Difficulty concentrating or making decisions? 0  Walking or climbing stairs? 0  Dressing or bathing? 0  Doing errands, shopping? 0  Preparing Food and eating ? N  Using the Toilet? N  In the past six months, have you accidently leaked urine? N  Do you have problems with loss of bowel control? N  Managing your Medications? N  Managing your Finances? N  Housekeeping or managing your Housekeeping? N    Patient Care Team: Donley Furth, MD as PCP - General  I have updated your Care Teams any recent Medical Services you may have received from other providers in the past year.     Assessment:   This is a routine wellness examination for Benjie.  Hearing/Vision screen Hearing Screening - Comments:: Denies hearing difficulties   Vision Screening - Comments:: Wears rx glasses - up to date with routine eye exams with  Dr Terrall Ferraris   Goals Addressed               This Visit's Progress     Remain active (pt-stated)        Lose weight.       Depression Screen     04/06/2023    8:31 AM 04/02/2023    2:49 PM 02/09/2023    8:28 AM 06/25/2022   10:47 AM 02/23/2022    8:18 AM 01/28/2022    8:43 AM 11/08/2017    8:07 AM  PHQ 2/9 Scores  PHQ - 2 Score 0 0 0 1 0 1 0  PHQ- 9 Score 0 0 2 4  5      Fall Risk     04/05/2024    3:30 PM 04/06/2023    8:30 AM 04/02/2023    2:51 PM 04/01/2023    8:32 PM 03/29/2023   11:31 AM  Fall Risk   Falls in the past year? 0 0 0 0 0  Number falls in past yr: 0 0 0 0   Injury with Fall? 0 0 0 0   Risk for fall due to : No Fall Risks No Fall Risks No Fall Risks    Follow up Falls evaluation completed Falls evaluation completed Falls prevention discussed      MEDICARE RISK AT HOME:  Medicare Risk at Home Any stairs in or around the home?: No If so, are there any without handrails?: No Home free of loose throw rugs in walkways, pet beds, electrical cords, etc?: Yes Adequate  lighting in your home to reduce risk of falls?: Yes Life alert?: No Use of a cane, walker or w/c?: No Grab bars in the bathroom?: No Shower chair or bench in shower?: No Elevated toilet seat or a handicapped toilet?: No  TIMED UP AND GO:  Was the test performed?  No  Cognitive Function: 6CIT completed        04/05/2024    3:30 PM 04/02/2023    2:52 PM 02/23/2022    8:18 AM  6CIT Screen  What Year? 0 points 0 points 0 points  What month? 0 points 0 points 0 points  What time? 0 points 0 points 0 points  Count back from 20 0 points 0 points 0 points  Months in reverse 0 points 0 points 2 points  Repeat phrase 0 points 0 points 0 points  Total Score 0 points 0 points 2 points    Immunizations Immunization History  Administered Date(s) Administered  Fluad Quad(high Dose 65+) 07/28/2021, 06/25/2022   Influenza Split 08/02/2012   Influenza Whole 07/19/2005, 08/19/2007   Influenza,inj,Quad PF,6+ Mos 07/15/2017, 08/04/2018   Influenza,inj,quad, With Preservative 06/21/2017   Influenza-Unspecified 07/19/2014, 07/17/2015, 07/15/2016, 07/07/2023   PFIZER(Purple Top)SARS-COV-2 Vaccination 01/10/2020, 02/17/2021   Pfizer Covid-19 Vaccine Bivalent Booster 80yrs & up 07/23/2021, 02/20/2022   Pfizer(Comirnaty)Fall Seasonal Vaccine 12 years and older 12/26/2022   Pneumococcal Conjugate-13 07/18/2015   Pneumococcal Polysaccharide-23 07/19/2005, 03/02/2011   Td 10/19/1998, 01/13/2010   Tdap 01/12/2014   Zoster Recombinant(Shingrix) 07/17/2021, 01/07/2022   Zoster, Live 07/29/2016    Screening Tests Health Maintenance  Topic Date Due   Pneumococcal Vaccine: 50+ Years (3 of 3 - PCV20 or PCV21) 07/17/2020   COVID-19 Vaccine (6 - 2024-25 season) 06/20/2023   DTaP/Tdap/Td (4 - Td or Tdap) 01/13/2024   INFLUENZA VACCINE  05/19/2024   Medicare Annual Wellness (AWV)  04/05/2025   Colonoscopy  02/25/2028   Hepatitis C Screening  Completed   Zoster Vaccines- Shingrix  Completed   HPV  VACCINES  Aged Out   Meningococcal B Vaccine  Aged Out    Health Maintenance  Health Maintenance Due  Topic Date Due   Pneumococcal Vaccine: 50+ Years (3 of 3 - PCV20 or PCV21) 07/17/2020   COVID-19 Vaccine (6 - 2024-25 season) 06/20/2023   DTaP/Tdap/Td (4 - Td or Tdap) 01/13/2024   Health Maintenance Items Addressed:   Additional Screening:  Vision Screening: Recommended annual ophthalmology exams for early detection of glaucoma and other disorders of the eye. Would you like a referral to an eye doctor? No    Dental Screening: Recommended annual dental exams for proper oral hygiene  Community Resource Referral / Chronic Care Management: CRR required this visit?  No   CCM required this visit?  No   Plan:    I have personally reviewed and noted the following in the patient's chart:   Medical and social history Use of alcohol, tobacco or illicit drugs  Current medications and supplements including opioid prescriptions. Patient is not currently taking opioid prescriptions. Functional ability and status Nutritional status Physical activity Advanced directives List of other physicians Hospitalizations, surgeries, and ER visits in previous 12 months Vitals Screenings to include cognitive, depression, and falls Referrals and appointments  In addition, I have reviewed and discussed with patient certain preventive protocols, quality metrics, and best practice recommendations. A written personalized care plan for preventive services as well as general preventive health recommendations were provided to patient.   Dewayne Ford, LPN   4/43/1540   After Visit Summary: (MyChart) Due to this being a telephonic visit, the after visit summary with patients personalized plan was offered to patient via MyChart   Notes: Nothing significant to report at this time.

## 2024-04-06 ENCOUNTER — Encounter: Payer: Self-pay | Admitting: Internal Medicine

## 2024-04-06 ENCOUNTER — Ambulatory Visit: Admitting: Internal Medicine

## 2024-04-06 VITALS — BP 138/62 | HR 73 | Ht 69.0 in | Wt 238.2 lb

## 2024-04-06 DIAGNOSIS — Z860101 Personal history of adenomatous and serrated colon polyps: Secondary | ICD-10-CM | POA: Diagnosis not present

## 2024-04-06 DIAGNOSIS — R1310 Dysphagia, unspecified: Secondary | ICD-10-CM

## 2024-04-06 DIAGNOSIS — K219 Gastro-esophageal reflux disease without esophagitis: Secondary | ICD-10-CM

## 2024-04-06 DIAGNOSIS — K222 Esophageal obstruction: Secondary | ICD-10-CM | POA: Diagnosis not present

## 2024-04-06 MED ORDER — PANTOPRAZOLE SODIUM 40 MG PO TBEC: 40.0000 mg | DELAYED_RELEASE_TABLET | Freq: Two times a day (BID) | ORAL | 3 refills | Status: DC

## 2024-04-06 NOTE — Patient Instructions (Signed)
 We have sent the following medications to your pharmacy for you to pick up at your convenience:  Protonix   You have been scheduled for an endoscopy. Please follow written instructions given to you at your visit today.  If you use inhalers (even only as needed), please bring them with you on the day of your procedure.  If you take any of the following medications, they will need to be adjusted prior to your procedure:   DO NOT TAKE 7 DAYS PRIOR TO TEST- Trulicity (dulaglutide) Ozempic, Wegovy (semaglutide) Mounjaro (tirzepatide) Bydureon Bcise (exanatide extended release)  DO NOT TAKE 1 DAY PRIOR TO YOUR TEST Rybelsus (semaglutide) Adlyxin (lixisenatide) Victoza (liraglutide) Byetta (exanatide) ___________________________________________________________________________  _______________________________________________________  If your blood pressure at your visit was 140/90 or greater, please contact your primary care physician to follow up on this.  _______________________________________________________  If you are age 61 or older, your body mass index should be between 23-30. Your Body mass index is 35.18 kg/m. If this is out of the aforementioned range listed, please consider follow up with your Primary Care Provider.  If you are age 68 or younger, your body mass index should be between 19-25. Your Body mass index is 35.18 kg/m. If this is out of the aformentioned range listed, please consider follow up with your Primary Care Provider.   ________________________________________________________  The [facility] providers would like to encourage you to use MYCHART to communicate with providers for non-urgent requests or questions.  Due to long hold times on the telephone, sending your provider a message by [facility] may be a faster and more efficient way to get a response.  Please allow 48 business hours for a response.  Please remember that this is for non-urgent requests.   _______________________________________________________

## 2024-04-11 ENCOUNTER — Encounter: Payer: Self-pay | Admitting: Internal Medicine

## 2024-04-11 NOTE — Progress Notes (Signed)
 HISTORY OF PRESENT ILLNESS:  Luke Skinner is a 68 y.o. male who was last evaluated in this office March 24, 2019 for regarding GERD complicated by peptic stricture which required dilation.  See that dictation.  He presents today for follow-up.  [name] underwent upper endoscopy December 31, 2018 for to evaluate complaints of dysphagia.  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  He continues on pantoprazole  40 mg daily.  In recent months he has noticed breakthrough symptoms despite compliance with medical therapy.  He is also noted some recurrence of his dysphagia.  Blood work from 05/25/2024 was unremarkable including comprehensive metabolic panel and CBC with hemoglobin 13.6.  His last complete colonoscopy May 2022 revealed a diminutive adenoma for which follow-up in 7 years was recommended.  REVIEW OF SYSTEMS:  All non-GI ROS negative.  Past Medical History:  Diagnosis Date   Diverticulosis of colon    left side   GERD (gastroesophageal reflux disease)    Hiatal hernia    History of adenomatous polyp of colon    History of duodenal ulcer 2001   non-bleeding   History of kidney stones 2015   History of repair of tracheoesophageal fistula 1957   Hyperlipidemia, mixed    Hypertension    nuclear stress test in epic 08-05-2014  normal study no ischemic, nuclear ef 56%   Hypothyroidism    endocrinologist-- dr m. doerr;   dx age 70s   OA (osteoarthritis)    Renal calculus, right    S/P dilatation of esophageal stricture    followed by dr abran;   last balloon dilation 12-31-2022;   hx multiple dilatation's    Past Surgical History:  Procedure Laterality Date   COLONOSCOPY WITH PROPOFOL   02/24/2021   per Dr. abran, adenomatous polyp, repeat in 7 yrs   CYSTOSCOPY/URETEROSCOPY/HOLMIUM LASER/STENT PLACEMENT Right 03/19/2023   Procedure: CYSTOSCOPY RIGHT URETEROSCOPY,  HOLMIUM LASER LITHOTRIPSY, RIGHT URETERAL STENT PLACEMENT;  Surgeon: Cam Morene ORN, MD;  Location: [facility];  Service: Urology;  Laterality: Right;  100 MINUTES   ESOPHAGOGASTRODUODENOSCOPY (EGD) WITH ESOPHAGEAL DILATION  12/31/2022   dr abran;   balloon dilatation   GASTROSTOMY  1989   per Dr. Neysa FONTANA HERNIA REPAIR Left 1991   KNEE ARTHROSCOPY Left    1973;  1975;  1990;  2003   NASAL SEPTUM SURGERY  1992   SHOULDER ARTHROSCOPY W/ SUBACROMIAL DECOMPRESSION AND DISTAL CLAVICLE EXCISION Right 07/23/2004   @ [facility] by Dr. Melodi;   and labral debridement   TOTAL HIP ARTHROPLASTY Right 10/21/2016   Procedure: RIGHT TOTAL HIP ARTHROPLASTY ANTERIOR APPROACH;  Surgeon: Dempsey Melodi, MD;  Location: [facility];  Service: Orthopedics;  Laterality: Right;   TOTAL HIP ARTHROPLASTY Left 10/07/2022   Procedure: TOTAL HIP ARTHROPLASTY ANTERIOR APPROACH;  Surgeon: Melodi Dempsey, MD;  Location: [facility];  Service: Orthopedics;  Laterality: Left;   TOTAL KNEE ARTHROPLASTY  08/26/2007   @ [facility]  Dr. Melodi   TRACHEOESOPHAGEAL FISTULA REPAIR  1957   3 months old    Social History Luke Skinner  reports that he quit smoking about 25 years ago. His smoking use included cigarettes. He started smoking about 53 years ago. He has a 14 pack-year smoking history. He has never used smokeless tobacco. He reports that he does not currently use alcohol. He reports that he does not use drugs.  family history  includes Diabetes in his maternal uncle; Heart disease in his mother; Parkinsonism in his father; Thyroid  disease in his mother and sister.  Allergies  Allergen Reactions   Shellfish Allergy     Per pt crabs  feels weird   Simvastatin      Joint pain.     Sulfa Antibiotics Hives       PHYSICAL EXAMINATION: Vital signs: BP 138/62   Pulse 73   Ht 5' 9 (1.753 m)   Wt 238 lb 4 oz (108.1 kg)   BMI 35.18 kg/m   Constitutional: generally well-appearing, no acute  distress Psychiatric: alert and oriented x3, cooperative Eyes: extraocular movements intact, anicteric, conjunctiva pink Mouth: oral pharynx moist, no lesions Neck: supple no lymphadenopathy Cardiovascular: heart regular rate and rhythm, no murmur Lungs: clear to auscultation bilaterally Abdomen: soft, nontender, nondistended, no obvious ascites, no peritoneal signs, normal bowel sounds, no organomegaly Rectal: Omitted Extremities: no clubbing, cyanosis, or lower extremity edema bilaterally Skin: no lesions on visible extremities Neuro: No focal deficits.  Nerves intact  ASSESSMENT:  1.  GERD complicated by peptic stricture.  Now with breakthrough symptoms on once daily PPI and recurrent dysphagia. 2.  History of nonadvanced adenoma. 3.  History of tracheoesophageal fistula repair 4.  General Medical problems.  Stable  PLAN:  1.  Reflux precautions 2.  Increase pantoprazole  to 40 mg p.o. twice daily.  Prescribed.  Medication risks reviewed. 3.  Schedule upper endoscopy with esophageal dilation.The nature of the procedure, as well as the risks, benefits, and alternatives were carefully and thoroughly reviewed with the patient. Ample time for discussion and questions allowed. The patient understood, was satisfied, and agreed to proceed. 4.  Surveillance colonoscopy around 2029 5.  Ongoing general medical care with Dr. Johnny

## 2024-04-19 ENCOUNTER — Ambulatory Visit (HOSPITAL_BASED_OUTPATIENT_CLINIC_OR_DEPARTMENT_OTHER)
Admission: RE | Admit: 2024-04-19 | Discharge: 2024-04-19 | Disposition: A | Discharge status: Home / Self Care | Payer: Self-pay | Source: Ambulatory Visit | Attending: Family Medicine | Admitting: Family Medicine

## 2024-04-19 DIAGNOSIS — Z136 Encounter for screening for cardiovascular disorders: Secondary | ICD-10-CM | POA: Insufficient documentation

## 2024-04-20 ENCOUNTER — Ambulatory Visit: Payer: Self-pay | Admitting: Family Medicine

## 2024-04-20 DIAGNOSIS — Z136 Encounter for screening for cardiovascular disorders: Secondary | ICD-10-CM

## 2024-05-09 ENCOUNTER — Other Ambulatory Visit (INDEPENDENT_AMBULATORY_CARE_PROVIDER_SITE_OTHER)

## 2024-05-09 ENCOUNTER — Other Ambulatory Visit: Payer: Self-pay | Admitting: Family Medicine

## 2024-05-09 DIAGNOSIS — E039 Hypothyroidism, unspecified: Secondary | ICD-10-CM | POA: Diagnosis not present

## 2024-05-09 LAB — TSH: TSH: <0.01 u[IU]/mL — ABNORMAL LOW (ref 0.35–5.50)

## 2024-05-09 LAB — T3, FREE: T3, Free: 4.2 pg/mL (ref 2.3–4.2)

## 2024-05-09 LAB — T4, FREE: Free T4: 1.32 ng/dL (ref 0.60–1.60)

## 2024-05-10 ENCOUNTER — Encounter: Payer: Self-pay | Admitting: Internal Medicine

## 2024-05-10 ENCOUNTER — Ambulatory Visit: Admitting: Internal Medicine

## 2024-05-10 VITALS — BP 149/67 | HR 64 | Temp 97.5°F | Resp 9 | Ht 69.0 in | Wt 238.0 lb

## 2024-05-10 DIAGNOSIS — K222 Esophageal obstruction: Secondary | ICD-10-CM

## 2024-05-10 DIAGNOSIS — K449 Diaphragmatic hernia without obstruction or gangrene: Secondary | ICD-10-CM

## 2024-05-10 DIAGNOSIS — I10 Essential (primary) hypertension: Secondary | ICD-10-CM | POA: Diagnosis not present

## 2024-05-10 DIAGNOSIS — R1310 Dysphagia, unspecified: Secondary | ICD-10-CM | POA: Diagnosis not present

## 2024-05-10 DIAGNOSIS — K219 Gastro-esophageal reflux disease without esophagitis: Secondary | ICD-10-CM | POA: Diagnosis not present

## 2024-05-10 DIAGNOSIS — E039 Hypothyroidism, unspecified: Secondary | ICD-10-CM | POA: Diagnosis not present

## 2024-05-10 MED ORDER — SODIUM CHLORIDE 0.9 % IV SOLN: 500.0000 mL | Freq: Once | INTRAVENOUS | Status: DC

## 2024-05-10 NOTE — Op Note (Signed)
 [facility] Patient Name: Luke Skinner Procedure Date: 05/10/2024 10:32 AM MRN: [id number] Endoscopist: Norleen SAILOR. Abran , MD, [id number] Age: 68 Referring MD:  Date of Birth: 1955-12-09 Gender: Male Account #: [id number] Procedure:                Upper GI endoscopy with balloon dilation of the                            esophagus. 20 mm max Indications:              Dysphagia, Therapeutic procedure, Esophageal reflux Medicines:                Monitored Anesthesia Care Procedure:                Pre-Anesthesia Assessment:                           - Prior to the procedure, a History and Physical                            was performed, and patient medications and                            allergies were reviewed. The patient's tolerance of                            previous anesthesia was also reviewed. The risks                            and benefits of the procedure and the sedation                            options and risks were discussed with the patient.                            All questions were answered, and informed consent                            was obtained. Prior Anticoagulants: The patient has                            taken no anticoagulant or antiplatelet agents. ASA                            Grade Assessment: II - A patient with mild systemic                            disease. After reviewing the risks and benefits,                            the patient was deemed in satisfactory condition to                            undergo the procedure.  After obtaining informed consent, the endoscope was                            passed under direct vision. Throughout the                            procedure, the patient's blood pressure, pulse, and                            oxygen saturations were monitored continuously. The                            Olympus Scope [id number] was introduced through the                             mouth, and advanced to the second part of duodenum.                            The upper GI endoscopy was accomplished without                            difficulty. The patient tolerated the procedure                            well. Scope In: Scope Out: Findings:                 There was stricturing measuring about 14 to 15 mm                            at 25 cm from the incisors. This was the area of                            prior tracheoesophageal fistula repair. There was                            also a large caliber ringlike stricture at the                            gastroesophageal junction (42 cm from the                            incisors). No active inflammation or Barrett's. A                            TTS dilator was passed through the scope. Initial                            dilation of the DISTAL stricture with an 18-19-20                            mm balloon dilator was performed to 20 mm. No  mucosal disruption. Subsequently, the proximal                            postoperative narrowing was dilated to 19 mm. No                            disruption. Tolerated well.                           The exam of the esophagus was otherwise normal.                           The stomach was normal. Small hiatal hernia.                           The examined duodenum was normal.                           The cardia and gastric fundus were normal on                            retroflexion. Complications:            No immediate complications. Estimated Blood Loss:     Estimated blood loss: none. Impression:               1. Distal peptic stricture dilated to 20 mm                           2. Proximal postoperative stricture dilated to 19 mm                           3. Small hiatal hernia. Otherwise unremarkable EGD. Recommendation:           - Patient has a contact number available for                            emergencies. The signs and  symptoms of potential                            delayed complications were discussed with the                            patient. Return to normal activities tomorrow.                            Written discharge instructions were provided to the                            patient.                           - Post dilation diet.                           - Continue present medications.                           -  Office follow-up with Dr. Abran in 3 months. If                            issues with dysphagia persist, consider biopsy                            forcep ring disruption and/or triamcinolone                             injection followed by dilation. Norleen SAILOR. Abran, MD 05/10/2024 11:02:05 AM This report has been signed electronically.

## 2024-05-10 NOTE — Progress Notes (Signed)
 A/o x 3, VSS, gd SR's, pleased with anesthesia, report to RN

## 2024-05-10 NOTE — Patient Instructions (Signed)

## 2024-05-10 NOTE — Progress Notes (Signed)
 Called to room to assist during endoscopic procedure.  Patient ID and intended procedure confirmed with present staff. Received instructions for my participation in the procedure from the performing physician.

## 2024-05-10 NOTE — Progress Notes (Signed)
 Expand All Collapse All HISTORY OF PRESENT ILLNESS:   Luke Skinner is a 68 y.o. male who was last evaluated in this office March 24, 2019 for regarding GERD complicated by peptic stricture which required dilation.  See that dictation.  He presents today for follow-up.   [name] underwent upper endoscopy December 31, 2018 for to evaluate complaints of dysphagia.  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  He continues on pantoprazole  40 mg daily.  In recent months he has noticed breakthrough symptoms despite compliance with medical therapy.  He is also noted some recurrence of his dysphagia.   Blood work from 05/25/2024 was unremarkable including comprehensive metabolic panel and CBC with hemoglobin 13.6.  His last complete colonoscopy May 2022 revealed a diminutive adenoma for which follow-up in 7 years was recommended.   REVIEW OF SYSTEMS:   All non-GI ROS negative.       Past Medical History:  Diagnosis Date   Diverticulosis of colon      left side   GERD (gastroesophageal reflux disease)     Hiatal hernia     History of adenomatous polyp of colon     History of duodenal ulcer 2001    non-bleeding   History of kidney stones 2015   History of repair of tracheoesophageal fistula 1957   Hyperlipidemia, mixed     Hypertension      nuclear stress test in epic 08-05-2014  normal study no ischemic, nuclear ef 56%   Hypothyroidism      endocrinologist-- dr m. doerr;   dx age 53s   OA (osteoarthritis)     Renal calculus, right     S/P dilatation of esophageal stricture      followed by dr abran;   last balloon dilation 12-31-2022;   hx multiple dilatation's               Past Surgical History:  Procedure Laterality Date   COLONOSCOPY WITH PROPOFOL    02/24/2021    per Dr. abran, adenomatous polyp, repeat in 7 yrs   CYSTOSCOPY/URETEROSCOPY/HOLMIUM LASER/STENT  PLACEMENT Right 03/19/2023    Procedure: CYSTOSCOPY RIGHT URETEROSCOPY, HOLMIUM LASER LITHOTRIPSY, RIGHT URETERAL STENT PLACEMENT;  Surgeon: Cam Morene ORN, MD;  Location[facility];  Service: Urology;  Laterality: Right;  100 MINUTES   ESOPHAGOGASTRODUODENOSCOPY (EGD) WITH ESOPHAGEAL DILATION   12/31/2022    dr abran;   balloon dilatation   GASTROSTOMY   1989    per Dr. Neysa FONTANA HERNIA REPAIR Left 1991   KNEE ARTHROSCOPY Left      1973;  1975;  1990;  2003   NASAL SEPTUM SURGERY   1992   SHOULDER ARTHROSCOPY W/ SUBACROMIAL DECOMPRESSION AND DISTAL CLAVICLE EXCISION Right 07/23/2004    @ [facility] by Dr. Melodi;   and labral debridement   TOTAL HIP ARTHROPLASTY Right 10/21/2016    Procedure: RIGHT TOTAL HIP ARTHROPLASTY ANTERIOR APPROACH;  Surgeon: Dempsey Melodi, MD;  Location: [facility];  Service: Orthopedics;  Laterality: Right;   TOTAL HIP ARTHROPLASTY Left 10/07/2022    Procedure: TOTAL HIP ARTHROPLASTY ANTERIOR APPROACH;  Surgeon: Melodi Dempsey, MD;  Location: [facility];  Service: Orthopedics;  Laterality: Left;   TOTAL KNEE ARTHROPLASTY   08/26/2007    @ [facility]  Dr. Melodi   TRACHEOESOPHAGEAL FISTULA REPAIR   1957    3 months old  Social History Luke Skinner  reports that he quit smoking about 25 years ago. His smoking use included cigarettes. He started smoking about 53 years ago. He has a 14 pack-year smoking history. He has never used smokeless tobacco. He reports that he does not currently use alcohol. He reports that he does not use drugs.   family history includes Diabetes in his maternal uncle; Heart disease in his mother; Parkinsonism in his father; Thyroid  disease in his mother and sister.   Allergies       Allergies  Allergen Reactions   Shellfish Allergy        Per pt crabs  feels weird   Simvastatin         Joint pain.     Sulfa Antibiotics Hives            PHYSICAL EXAMINATION: Vital signs: BP 138/62   Pulse 73   Ht 5' 9  (1.753 m)   Wt 238 lb 4 oz (108.1 kg)   BMI 35.18 kg/m   Constitutional: generally well-appearing, no acute distress Psychiatric: alert and oriented x3, cooperative Eyes: extraocular movements intact, anicteric, conjunctiva pink Mouth: oral pharynx moist, no lesions Neck: supple no lymphadenopathy Cardiovascular: heart regular rate and rhythm, no murmur Lungs: clear to auscultation bilaterally Abdomen: soft, nontender, nondistended, no obvious ascites, no peritoneal signs, normal bowel sounds, no organomegaly Rectal: Omitted Extremities: no clubbing, cyanosis, or lower extremity edema bilaterally Skin: no lesions on visible extremities Neuro: No focal deficits.  Nerves intact   ASSESSMENT:   1.  GERD complicated by peptic stricture.  Now with breakthrough symptoms on once daily PPI and recurrent dysphagia. 2.  History of nonadvanced adenoma. 3.  History of tracheoesophageal fistula repair 4.  General Medical problems.  Stable   PLAN:   1.  Reflux precautions 2.  Increase pantoprazole  to 40 mg p.o. twice daily.  Prescribed.  Medication risks reviewed. 3.  Schedule upper endoscopy with esophageal dilation.The nature of the procedure, as well as the risks, benefits, and alternatives were carefully and thoroughly reviewed with the patient. Ample time for discussion and questions allowed. The patient understood, was satisfied, and agreed to proceed. 4.  Surveillance colonoscopy around 2029 5.  Ongoing general medical care with Dr. Johnny

## 2024-05-11 ENCOUNTER — Telehealth: Payer: Self-pay | Admitting: *Deleted

## 2024-05-11 NOTE — Telephone Encounter (Signed)
  Follow up Call-     05/10/2024    9:08 AM 12/31/2022    7:12 AM  Call back number  Post procedure Call Back phone  # [phone]  Permission to leave phone message Yes Yes     Patient questions:  Do you have a fever, pain , or abdominal swelling? No. Pain Score  0 *  Have you tolerated food without any problems? Yes.    Have you been able to return to your normal activities? Yes.    Do you have any questions about your discharge instructions: Diet   No. Medications  No. Follow up visit  No.  Do you have questions or concerns about your Care? No.  Actions: * If pain score is 4 or above: No action needed, pain <4.

## 2024-05-13 ENCOUNTER — Other Ambulatory Visit: Payer: Self-pay | Admitting: Family Medicine

## 2024-05-19 ENCOUNTER — Other Ambulatory Visit: Payer: Self-pay

## 2024-05-19 MED ORDER — PANTOPRAZOLE SODIUM 40 MG PO TBEC: 40.0000 mg | DELAYED_RELEASE_TABLET | Freq: Two times a day (BID) | ORAL | 3 refills | Status: AC

## 2024-06-06 ENCOUNTER — Other Ambulatory Visit: Payer: Self-pay | Admitting: Family Medicine

## 2024-06-12 ENCOUNTER — Other Ambulatory Visit: Payer: Self-pay | Admitting: Family Medicine

## 2024-06-12 NOTE — Telephone Encounter (Unsigned)
 Copied from CRM [id number]. Topic: Clinical - Medication Refill >> Jun 12, 2024 11:56 AM Revonda D wrote: Medication: HYDROcodone -acetaminophen  (NORCO) 10-325 MG tablet  Has the patient contacted their pharmacy? No (Agent: If no, request that the patient contact the pharmacy for the refill. If patient does not wish to contact the pharmacy document the reason why and proceed with request.) (Agent: If yes, when and what did the pharmacy advise?)  This is the patient's preferred pharmacy:  [facility] [address], [place] - [address] [place] [address] Phone: [phone] Fax: [phone]  Is this the correct pharmacy for this prescription? Yes If no, delete pharmacy and type the correct one.   Has the prescription been filled recently? No  Is the patient out of the medication? No  Has the patient been seen for an appointment in the last year OR does the patient have an upcoming appointment? Yes  Can we respond through MyChart? No  Agent: Please be advised that Rx refills may take up to 3 business days. We ask that you follow-up with your pharmacy.

## 2024-06-13 MED ORDER — HYDROCODONE-ACETAMINOPHEN 10-325 MG PO TABS: 1.0000 | ORAL_TABLET | ORAL | 0 refills | Status: DC | PRN

## 2024-06-14 DIAGNOSIS — L570 Actinic keratosis: Secondary | ICD-10-CM | POA: Diagnosis not present

## 2024-06-14 DIAGNOSIS — L565 Disseminated superficial actinic porokeratosis (DSAP): Secondary | ICD-10-CM | POA: Diagnosis not present

## 2024-08-08 DIAGNOSIS — I10 Essential (primary) hypertension: Secondary | ICD-10-CM | POA: Diagnosis not present

## 2024-08-08 DIAGNOSIS — Z6834 Body mass index (BMI) 34.0-34.9, adult: Secondary | ICD-10-CM | POA: Diagnosis not present

## 2024-08-08 DIAGNOSIS — J0100 Acute maxillary sinusitis, unspecified: Secondary | ICD-10-CM | POA: Diagnosis not present

## 2024-08-14 ENCOUNTER — Encounter: Payer: Self-pay | Admitting: Family Medicine

## 2024-08-14 ENCOUNTER — Ambulatory Visit: Admitting: Family Medicine

## 2024-08-14 VITALS — BP 142/60 | HR 55 | Temp 97.8°F | Wt 240.0 lb

## 2024-08-14 DIAGNOSIS — J019 Acute sinusitis, unspecified: Secondary | ICD-10-CM | POA: Diagnosis not present

## 2024-08-14 MED ORDER — LEVOFLOXACIN 500 MG PO TABS: 500.0000 mg | ORAL_TABLET | Freq: Every day | ORAL | 0 refills | Status: AC

## 2024-08-14 MED ORDER — HYDROCODONE-ACETAMINOPHEN 10-325 MG PO TABS: 1.0000 | ORAL_TABLET | ORAL | 0 refills | Status: AC | PRN

## 2024-08-14 NOTE — Progress Notes (Signed)
   Subjective:    Patient ID: Luke Skinner, male    DOB: 11-19-1955, 68 y.o.   MRN: [id number]  HPI Here for a stubborn sinus infection. About 2 weeks ago he developed sinus congestion, headache and PND. No fever or cough. He went to an urgent care on 08-08-24, and they gave him 7 days of Augmentin. This helped but he still has lingering symptoms.    Review of Systems  Constitutional: Negative.   HENT:  Positive for congestion, postnasal drip and sinus pressure. Negative for ear pain and sore throat.   Eyes: Negative.   Respiratory: Negative.         Objective:   Physical Exam Constitutional:      Appearance: Normal appearance.  HENT:     Right Ear: Tympanic membrane, ear canal and external ear normal.     Left Ear: Tympanic membrane, ear canal and external ear normal.     Nose: Nose normal.     Mouth/Throat:     Pharynx: Oropharynx is clear.  Eyes:     Conjunctiva/sclera: Conjunctivae normal.  Pulmonary:     Effort: Pulmonary effort is normal.     Breath sounds: Normal breath sounds.  Lymphadenopathy:     Cervical: No cervical adenopathy.  Neurological:     Mental Status: He is alert.           Assessment & Plan:  Partially treated sinusitis. We will treat with 10 days of Levaquin . Add Mucinex as needed. Garnette Olmsted, MD

## 2024-08-15 ENCOUNTER — Ambulatory Visit: Attending: Internal Medicine | Admitting: Internal Medicine

## 2024-08-15 ENCOUNTER — Telehealth: Payer: Self-pay | Admitting: Pharmacy Technician

## 2024-08-15 ENCOUNTER — Encounter: Payer: Self-pay | Admitting: Internal Medicine

## 2024-08-15 ENCOUNTER — Other Ambulatory Visit (HOSPITAL_COMMUNITY): Payer: Self-pay

## 2024-08-15 VITALS — BP 148/87 | HR 68 | Resp 16 | Ht 69.0 in | Wt 242.9 lb

## 2024-08-15 DIAGNOSIS — M791 Myalgia, unspecified site: Secondary | ICD-10-CM

## 2024-08-15 DIAGNOSIS — T466X5A Adverse effect of antihyperlipidemic and antiarteriosclerotic drugs, initial encounter: Secondary | ICD-10-CM

## 2024-08-15 DIAGNOSIS — R011 Cardiac murmur, unspecified: Secondary | ICD-10-CM | POA: Diagnosis not present

## 2024-08-15 DIAGNOSIS — R931 Abnormal findings on diagnostic imaging of heart and coronary circulation: Secondary | ICD-10-CM

## 2024-08-15 DIAGNOSIS — T466X5D Adverse effect of antihyperlipidemic and antiarteriosclerotic drugs, subsequent encounter: Secondary | ICD-10-CM | POA: Diagnosis not present

## 2024-08-15 DIAGNOSIS — E785 Hyperlipidemia, unspecified: Secondary | ICD-10-CM

## 2024-08-15 DIAGNOSIS — I10 Essential (primary) hypertension: Secondary | ICD-10-CM | POA: Diagnosis not present

## 2024-08-15 MED ORDER — NEXLIZET 180-10 MG PO TABS: 1.0000 | ORAL_TABLET | Freq: Every day | ORAL | 3 refills | Status: AC

## 2024-08-15 NOTE — Telephone Encounter (Signed)
 Pharmacy Patient Advocate Encounter  Received notification from [employer] that Prior Authorization for [id number] has been APPROVED from 08/15/24 to 02/11/25   PA #/Case ID/Reference #: [id number]

## 2024-08-15 NOTE — Progress Notes (Signed)
 OFFICE NOTE  Chief Complaint:  Follow-up, abnormal calcium score  Primary Care Physician: Johnny Garnette LABOR, MD  HPI:  Luke Skinner is a pleasant 68 year old male who was kindly referred to me by Dr. Johnny for evaluation of a labile hypertension and dyspnea. He recently had changes in his blood pressure medicine including the addition of Toprol -XL 50 mg daily. He had reported some improvement in his blood pressures on this however was having problems with diastolic hypotension. His losartan  was then decreased from 100 mg daily to 50 mg daily. Blood pressure then ran higher up in the 160s to 170 systolic. For a few days he took his wife's HCTZ and reported blood pressures in the 130s to 140 systolic. He has been having some dizziness and thinks this is related to blood pressure changes. He also reports some worsening fatigue and exercise intolerance, although denies any chest pain. He has difficult sleep at night and often times dozes off during the day. His Epworth sleepiness scale score was 10, suggesting the possibility of sleep apnea. He says that he had a sleep study in 2008 and this was negative for apnea, but did report that he does not get into REM sleep. An EKG in the office today is abnormal demonstrating anterolateral T-wave inversions in sinus bradycardia 55.  I saw Luke Skinner in the office today for follow-up of his stress test. This was negative for ischemia. Notably with the change in his medications including adding back his HCTZ, the blood pressure seems to be better controlled. He is currently on losartan  50 mg (one half tablet and (daily, HCTZ 25 mg daily and Toprol -XL 50 mg which she takes at night. He denies any worsening chest pain or shortness of breath.  Luke Skinner returns today for follow-up. We have further adjusted his blood pressure medicine and currently he is on the Hyzaar 10/25 and Toprol  XL 50 mg daily. He reports his blood pressures at home range between 120 and  140 systolic over 60s. Overall he is feeling very well. Blood pressure today in the office was 130/72.  03/17/2017  Luke Skinner was seen in follow-up today. He reports some lability to his blood pressure but otherwise has been fairly well controlled. Today's 127/80. He is on metoprolol  50 mg daily at bedtime and he takes losartan  HCTZ in the morning. He says that he notes sometimes in the morning he gets swimmy headed. Is not clearly associated with consistent medication use. He denies any chest pain or worsening shortness of breath. He does have some fatigue and is on treatment for low testosterone . He also inquired today about medications for erectile dysfunction. He says in the past he's used Viagra , though recently was given low-dose Revatio , but reports that after taking 4 tablets (80 mg) he felt his heart racing and was rather uncomfortable. I suspect this may be due to low blood pressure.  04/05/2018  Luke Skinner returns today for follow-up.  He occasionally is getting some swimmy headedness again.  There is some lability in his blood pressure and he notes is worse when he is not well-hydrated.  Blood pressure was 146/70 however repeat was 136/64.  Seems to be well controlled on his current medications.  Continues to struggle with erectile dysfunction but is followed by Dr. Cam for this.  05/25/2023  Luke Skinner is seen today to reestablish care.  I last saw him in 2019.  He was noted to have elevated blood pressure today 164/80 however recheck came down  to 146/84.  He did have recent lipids in April showing total cholesterol 161, HDL 46, triglycerides 102 and LDL 95.  He remains above a target LDL less than 70.  Unfortunately, he has been intolerant to statins, causing him significant myalgias and joint pain.  He said it may have even contributed to him needing hip replacement.  08/15/2024  Luke Skinner returns today for follow-up.  He also had a calcium score along with his wife in July.   This was elevated showing a score of 398, 72nd percentile for age and sex matched controls.  Aortic atherosclerosis was also noted.  We have previously tried to lower his cholesterol further however he had been intolerant to statins and remains on ezetimibe  with labs as above showing an LDL of 95.  We had discussed adding Nexletol to his regimen but he was somewhat hesitant at the time.  He has had recent issues with sinus infection.  He is also had a difficult to resolve rash for which he sees dermatology.  PMHx:  Past Medical History:  Diagnosis Date   Diverticulosis of colon    left side   GERD (gastroesophageal reflux disease)    Hiatal hernia    History of adenomatous polyp of colon    History of duodenal ulcer 2001   non-bleeding   History of kidney stones 2015   History of repair of tracheoesophageal fistula 1957   Hyperlipidemia, mixed    Hypertension    nuclear stress test in epic 08-05-2014  normal study no ischemic, nuclear ef 56%   Hypothyroidism    endocrinologist-- dr m. doerr;   dx age 59s   OA (osteoarthritis)    Renal calculus, right    S/P dilatation of esophageal stricture    followed by dr abran;   last balloon dilation 12-31-2022;   hx multiple dilatation's    Past Surgical History:  Procedure Laterality Date   COLONOSCOPY WITH PROPOFOL   02/24/2021   per Dr. Abran, adenomatous polyp, repeat in 7 yrs   CYSTOSCOPY/URETEROSCOPY/HOLMIUM LASER/STENT PLACEMENT Right 03/19/2023   Procedure: CYSTOSCOPY RIGHT URETEROSCOPY, HOLMIUM LASER LITHOTRIPSY, RIGHT URETERAL STENT PLACEMENT;  Surgeon: Cam Morene ORN, MD;  Location: [facility];  Service: Urology;  Laterality: Right;  100 MINUTES   ESOPHAGOGASTRODUODENOSCOPY (EGD) WITH ESOPHAGEAL DILATION  12/31/2022   dr abran;   balloon dilatation   GASTROSTOMY  1989   per Dr. Neysa FONTANA HERNIA REPAIR Left 1991   KNEE ARTHROSCOPY Left    1973;  1975;  1990;  2003   NASAL SEPTUM SURGERY  1992    SHOULDER ARTHROSCOPY W/ SUBACROMIAL DECOMPRESSION AND DISTAL CLAVICLE EXCISION Right 07/23/2004   @ [facility] by Dr. Melodi;   and labral debridement   TOTAL HIP ARTHROPLASTY Right 10/21/2016   Procedure: RIGHT TOTAL HIP ARTHROPLASTY ANTERIOR APPROACH;  Surgeon: Dempsey Melodi, MD;  Location: [facility];  Service: Orthopedics;  Laterality: Right;   TOTAL HIP ARTHROPLASTY Left 10/07/2022   Procedure: TOTAL HIP ARTHROPLASTY ANTERIOR APPROACH;  Surgeon: Melodi Dempsey, MD;  Location: [facility];  Service: Orthopedics;  Laterality: Left;   TOTAL KNEE ARTHROPLASTY  08/26/2007   @ [facility]  Dr. Melodi   TRACHEOESOPHAGEAL FISTULA REPAIR  1957   3 months old    FAMHx:  Family History  Problem Relation Age of Onset   Heart disease Mother    Thyroid  disease Mother    Parkinsonism Father    Thyroid  disease Sister    Diabetes Maternal Uncle  Colon cancer Neg Hx    Colon polyps Neg Hx    Esophageal cancer Neg Hx    Rectal cancer Neg Hx    Stomach cancer Neg Hx     SOCHx:   reports that he quit smoking about 25 years ago. His smoking use included cigarettes. He started smoking about 53 years ago. He has a 14 pack-year smoking history. He has never used smokeless tobacco. He reports that he does not currently use alcohol. He reports that he does not use drugs.  ALLERGIES:  Allergies  Allergen Reactions   Shellfish Allergy Other (See Comments)    Per pt crabs  feels weird   Simvastatin  Other (See Comments)    Joint pain.     Sulfa Antibiotics Hives    ROS: Pertinent items noted in HPI and remainder of comprehensive ROS otherwise negative.  HOME MEDS: Current Outpatient Medications  Medication Sig Dispense Refill   Ascorbic Acid (VITAMIN C) 1000 MG tablet Take 1,000 mg by mouth daily.     B Complex Vitamins (VITAMIN B COMPLEX PO) Take 1 tablet by mouth daily.     Bempedoic Acid-Ezetimibe  (NEXLIZET) 180-10 MG TABS Take 1 tablet by mouth daily. 90 tablet 3   cyanocobalamin  (VITAMIN B12) 1000 MCG  tablet Take 1,000 mcg by mouth daily.     hydrochlorothiazide  (HYDRODIURIL ) 25 MG tablet Take 1 tablet (25 mg total) by mouth daily. 100 tablet 3   HYDROcodone -acetaminophen  (NORCO) 10-325 MG tablet Take 1 tablet by mouth every 4 (four) hours as needed. 30 tablet 0   ketoconazole  (NIZORAL ) 2 % cream Apply 1 Application topically 2 (two) times daily. (Patient taking differently: Apply 1 Application topically 2 (two) times daily as needed.) 30 g 5   levofloxacin  (LEVAQUIN ) 500 MG tablet Take 1 tablet (500 mg total) by mouth daily for 10 days. 10 tablet 0   levothyroxine  (SYNTHROID ) 25 MCG tablet Take 1 tablet by mouth once daily 100 tablet 3   meloxicam  (MOBIC ) 15 MG tablet Take 1 tablet (15 mg total) by mouth daily. 100 tablet 3   methocarbamol  (ROBAXIN ) 500 MG tablet Take 1 tablet (500 mg total) by mouth every 6 (six) hours as needed for muscle spasms. 60 tablet 5   metoprolol  succinate (TOPROL -XL) 50 MG 24 hr tablet TAKE 1 TABLET BY MOUTH ONCE DAILY WITH  OR  IMMEDIATELY  FOLLOWING  A  MEAL. 100 tablet 3   mometasone  (ELOCON ) 0.1 % cream Apply topically as needed. (Patient taking differently: Apply topically as needed (rash).) 45 g 11   Multiple Vitamins-Minerals (MENS MULTIVITAMIN PO) Take 1 tablet by mouth at bedtime.     pantoprazole  (PROTONIX ) 40 MG tablet Take 1 tablet (40 mg total) by mouth 2 (two) times daily. 200 tablet 3   Potassium 99 MG TABS Take 99 mg by mouth at bedtime.     Potassium Gluconate 595 MG CAPS Take 595 mg by mouth at bedtime.     pyridoxine (B-6) 100 MG tablet Take 100 mg by mouth daily.     telmisartan  (MICARDIS ) 80 MG tablet Take 1 tablet (80 mg total) by mouth daily. 100 tablet 3   valACYclovir  (VALTREX ) 1000 MG tablet TAKE 1 TABLET BY MOUTH TWICE DAILY AS NEEDED (FEVER  BLISTERS) 60 tablet 0   No current facility-administered medications for this visit.    LABS/IMAGING: No results found for this or any previous visit (from the past 48 hours). No results  found.  WEIGHTS: Wt Readings from Last 3 Encounters:  08/15/24 242 lb 14.4 oz (110.2 kg)  08/14/24 240 lb (108.9 kg)  05/10/24 238 lb (108 kg)    VITALS: BP (!) 148/87 (BP Location: Right Arm, Patient Position: Sitting, Cuff Size: Large)   Pulse 68   Resp 16   Ht 5' 9 (1.753 m)   Wt 242 lb 14.4 oz (110.2 kg)   SpO2 96%   BMI 35.87 kg/m   EXAM: General appearance: alert and no distress Neck: no carotid bruit, no JVD, and nasal congestion Lungs: clear to auscultation bilaterally Heart: regular rate and rhythm, S1, S2 normal, and systolic murmur: systolic ejection 2/6, crescendo at 2nd right intercostal space Abdomen: soft, non-tender; bowel sounds normal; no masses,  no organomegaly Extremities: extremities normal, atraumatic, no cyanosis or edema Pulses: 2+ and symmetric Skin: Papular rash over both arms Neurologic: Grossly normal Psych: Pleasant  EKG: EKG Interpretation Date/Time:  Tuesday August 15 2024 11:10:58 EDT Ventricular Rate:  69 PR Interval:  166 QRS Duration:  112 QT Interval:  398 QTC Calculation: 426 R Axis:   -13  Text Interpretation: Normal sinus rhythm Cannot rule out Anterior infarct , age undetermined When compared with ECG of 25-May-2023 13:10, No significant change was found Confirmed by Skinner Luke [phone]) on 08/15/2024 12:22:26 PM    ASSESSMENT: Murmur CAC score of 398, 72nd percentile (04/2024) Aortic atherosclerosis Mixed dyslipidemia, goal LDL less than 70 Statin intolerant-myalgias Labile hypertension - BP stable on current regimen High risk features for sleep apnea Erectile dysfunction  PLAN: 1.   Luke Skinner continues to be above target LDL less than 70 and now is found to have aged Gaile coronary artery calcification and aortic atherosclerosis.  I would advise adding Nexletol to his Zetia  as combination Nexlizet.  Plan repeat lipids including NMR and LPA in 3 months.  In addition he was found to have an aortic area of murmur  today.  I like to get an echocardiogram to further evaluate this.  Blood pressure was initially elevated did improve however to 146/60 but he does have a wide pulse pressure.  This could be due to some aortic insufficiency and the echo would be helpful to evaluate that as well.  Plan follow-up otherwise in 1 year or sooner if necessary.  Luke KYM Mona, MD, [facility], FNLA, FACP  [place]  [facility]  Medical Director of the Advanced Lipid Disorders &  Cardiovascular Risk Reduction Clinic Diplomate of the American Board of Clinical Lipidology Attending Cardiologist  Direct Dial: [phone]  Fax: [phone]  Website:  [email or web address]    Luke Skinner 08/15/2024, 12:22 PM

## 2024-08-15 NOTE — Patient Instructions (Signed)
 Medication Instructions:  STOP zetia   START Nexlizet 180/10mg  daily for cholesterol   *If you need a refill on your cardiac medications before your next appointment, please call your pharmacy*  Lab Work: FASTING lab work in 3 months -- NMR lipoprofile, LPa  If you have labs (blood work) drawn today and your tests are completely normal, you will receive your results only by: MyChart Message (if you have MyChart) OR A paper copy in the mail If you have any lab test that is abnormal or we need to change your treatment, we will call you to review the results.  Testing/Procedures: Your physician has requested that you have an echocardiogram. Echocardiography is a painless test that uses sound waves to create images of your heart. It provides your doctor with information about the size and shape of your heart and how well your heart's chambers and valves are working. This procedure takes approximately one hour. There are no restrictions for this procedure. Please do NOT wear cologne, perfume, aftershave, or lotions (deodorant is allowed). Please arrive 15 minutes prior to your appointment time.  Please note: We ask at that you not bring children with you during ultrasound (echo/ vascular) testing. Due to room size and safety concerns, children are not allowed in the ultrasound rooms during exams. Our front office staff cannot provide observation of children in our lobby area while testing is being conducted. An adult accompanying a patient to their appointment will only be allowed in the ultrasound room at the discretion of the ultrasound technician under special circumstances. We apologize for any inconvenience.   Follow-Up: At [facility], you and your health needs are our priority.  As part of our continuing mission to provide you with exceptional heart care, our providers are all part of one team.  This team includes your primary Cardiologist (physician) and Advanced Practice  Providers or APPs (Physician Assistants and Nurse Practitioners) who all work together to provide you with the care you need, when you need it.  Your next appointment:    12 months with Dr. Mona  We recommend signing up for the patient portal called MyChart.  Sign up information is provided on this After Visit Summary.  MyChart is used to connect with patients for Virtual Visits (Telemedicine).  Patients are able to view lab/test results, encounter notes, upcoming appointments, etc.  Non-urgent messages can be sent to your provider as well.   To learn more about what you can do with MyChart, go to [email or web address].   Other Instructions

## 2024-08-15 NOTE — Telephone Encounter (Signed)
    Pharmacy Patient Advocate Encounter   Received notification from CoverMyMeds that prior authorization for NEXLIZET is required/requested.   Insurance verification completed.   The patient is insured through [facility] ADVANTAGE/RX ADVANCE.   Per test claim: PA required; PA submitted to above mentioned insurance via Latent Key/confirmation #/EOC [id number] Status is pending

## 2024-08-15 NOTE — Telephone Encounter (Signed)
 Sorry I didn't see your note about test claim until after I sent the message..  Per test claim -100.00 for 30 days for nexlizet

## 2024-08-16 ENCOUNTER — Other Ambulatory Visit (HOSPITAL_COMMUNITY): Payer: Self-pay

## 2024-08-16 ENCOUNTER — Telehealth: Payer: Self-pay | Admitting: Pharmacy Technician

## 2024-08-16 DIAGNOSIS — Z872 Personal history of diseases of the skin and subcutaneous tissue: Secondary | ICD-10-CM | POA: Diagnosis not present

## 2024-08-16 DIAGNOSIS — Z85828 Personal history of other malignant neoplasm of skin: Secondary | ICD-10-CM | POA: Diagnosis not present

## 2024-08-16 DIAGNOSIS — L249 Irritant contact dermatitis, unspecified cause: Secondary | ICD-10-CM | POA: Diagnosis not present

## 2024-08-16 DIAGNOSIS — L739 Follicular disorder, unspecified: Secondary | ICD-10-CM | POA: Diagnosis not present

## 2024-08-16 DIAGNOSIS — L565 Disseminated superficial actinic porokeratosis (DSAP): Secondary | ICD-10-CM | POA: Diagnosis not present

## 2024-08-16 DIAGNOSIS — L988 Other specified disorders of the skin and subcutaneous tissue: Secondary | ICD-10-CM | POA: Diagnosis not present

## 2024-08-16 DIAGNOSIS — B358 Other dermatophytoses: Secondary | ICD-10-CM | POA: Diagnosis not present

## 2024-08-16 DIAGNOSIS — Z08 Encounter for follow-up examination after completed treatment for malignant neoplasm: Secondary | ICD-10-CM | POA: Diagnosis not present

## 2024-08-16 DIAGNOSIS — L578 Other skin changes due to chronic exposure to nonionizing radiation: Secondary | ICD-10-CM | POA: Diagnosis not present

## 2024-08-16 NOTE — Telephone Encounter (Signed)
   Patient Advocate Encounter   The patient was approved for a [employer] grant that will help cover the cost of nexlizet Total amount awarded, 2500.  Effective: 07/17/24 - 07/16/25   APW:[id number] ERW:[id number] Hmnle:[id number] PI:[id number] [employer] ID: [id number]   Pharmacy provided with approval and processing information. Patient informed via mychart/telephone

## 2024-08-16 NOTE — Telephone Encounter (Signed)
 Sent mychart

## 2024-08-17 ENCOUNTER — Encounter: Payer: Self-pay | Admitting: Internal Medicine

## 2024-08-17 ENCOUNTER — Ambulatory Visit: Admitting: Internal Medicine

## 2024-08-17 ENCOUNTER — Ambulatory Visit: Payer: Self-pay | Admitting: *Deleted

## 2024-08-17 VITALS — BP 122/68 | HR 68 | Ht 69.0 in | Wt 237.0 lb

## 2024-08-17 DIAGNOSIS — R1310 Dysphagia, unspecified: Secondary | ICD-10-CM | POA: Diagnosis not present

## 2024-08-17 DIAGNOSIS — K222 Esophageal obstruction: Secondary | ICD-10-CM | POA: Diagnosis not present

## 2024-08-17 DIAGNOSIS — Z860101 Personal history of adenomatous and serrated colon polyps: Secondary | ICD-10-CM

## 2024-08-17 DIAGNOSIS — K219 Gastro-esophageal reflux disease without esophagitis: Secondary | ICD-10-CM

## 2024-08-17 NOTE — Telephone Encounter (Signed)
 FYI Only or Action Required?: FYI only for provider: ED advised and declined, requesting ENT referral.  Patient was last seen in primary care on 08/14/2024 by Luke Garnette LABOR, MD.  Called Nurse Triage reporting Nasal Congestion, difficulty swallowing at times.   Symptoms began today.  Interventions attempted: Prescription medications: levaquin  and mucinex.  Symptoms are: gradually worsening.  Triage Disposition: Go to ED Now (or PCP Triage)  Patient/caregiver understands and will follow disposition?: No, wishes to speak with PCP           Copied from CRM #[id number]. Topic: Clinical - Red Word Triage >> Aug 17, 2024  3:51 PM Luke Skinner wrote: Red Word that prompted transfer to Nurse Triage: Patient started a new antibiotic since Monday, experiencing more symptoms, like a dead animal smell, pressure in head, trouble swallowing. Reason for Disposition  Patient sounds very sick or weak to the triager  Answer Assessment - Initial Assessment Questions Recommended ED due to report of difficulty swallowing or feels like something in back of throat due to sinus drainage. Smell / odor of drainage is severe. Taking antibiotics and mucinex is helping loosen up things but concerned will not be able to do procedure to stretch esophagus NOV 17.  Went to dentist today and all clear with no infection in teeth. Appt with gastro today , no mention of canceling appt.  Patient would like to know if PCP recommended ED or if ENT referral is best option. Please advise.  CAL notified patient does not want to go to ED. Patient does not have fever.     1. LOCATION: Where does it hurt?      No severe pain, increased difficulty swallowing and severe odor from sinuses 2. ONSET: When did the sinus pain start?  (e.g., hours, days)      Greater than 10 days ago  3. SEVERITY: How bad is the pain?   (Scale 0-10; or none, mild, moderate or severe)     na 4. RECURRENT SYMPTOM: Have you ever had sinus  problems before? If Yes, ask: When was the last time? and What happened that time?      Yes has been hospitalized before for deep sinus infection 5. NASAL CONGESTION: Is the nose blocked? If Yes, ask: Can you open it or must you breathe through your mouth?     Can breath  6. NASAL DISCHARGE: Do you have discharge from your nose? If so ask, What color?     Yes dark yellow and brown in color, blows out of left side of nostril  7. FEVER: Do you have a fever? If Yes, ask: What is it, how was it measured, and when did it start?      No  8. OTHER SYMPTOMS: Do you have any other symptoms? (e.g., sore throat, cough, earache, difficulty breathing)      Difficulty swallowing and feels like mucus getting hung in throat. Worsening smell and head pressure. Reports smells like poop in head.   9. PREGNANCY: Is there any chance you are pregnant? When was your last menstrual period?     Na  Protocols used: Sinus Pain or Congestion-A-AH

## 2024-08-17 NOTE — Progress Notes (Signed)
 HISTORY OF PRESENT ILLNESS:  Luke Skinner is a 68 y.o. male with multiple medical problems as listed below.  He presents today for post procedure follow-up regarding issues with dysphagia.  He is accompanied by his wife.  The patient was last evaluated in this office 12/08/2023 regarding GERD complicated by peptic stricture and intermittent solid food dysphagia.  On May 10, 2024 he underwent upper endoscopy.  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  More distally he was noted to have classic peptic stricture which was balloon dilated to 20 mm without mucosal disruption.  The more proximal stricture was also dilated to 19 mm without mucosal disruption.  Exam was otherwise unremarkable.  He follows up at this time.  He tells me that his swallowing is unchanged.  Still with intermittent solid food dysphagia.  Particularly proximal.  Currently having issues with upper respiratory illness and congestion.  He has been taking his pantoprazole  twice daily.  REVIEW OF SYSTEMS:  All non-GI ROS negative except for sinus and allergy, arthritis, back pain, cough, fatigue, headaches, fever, heart murmur, itching, muscle cramps, skin rash, sore throat.  He is currently on antibiotics.  Past Medical History:  Diagnosis Date   Diverticulosis of colon    left side   GERD (gastroesophageal reflux disease)    Hiatal hernia    History of adenomatous polyp of colon    History of duodenal ulcer 2001   non-bleeding   History of kidney stones 2015   History of repair of tracheoesophageal fistula 1957   Hyperlipidemia, mixed    Hypertension    nuclear stress test in epic 08-05-2014  normal study no ischemic, nuclear ef 56%   Hypothyroidism    endocrinologist-- dr m. doerr;   dx age 91s   OA (osteoarthritis)    Renal calculus, right    S/P dilatation of esophageal stricture    followed by dr abran;   last balloon  dilation 12-31-2022;   hx multiple dilatation's    Past Surgical History:  Procedure Laterality Date   COLONOSCOPY WITH PROPOFOL   02/24/2021   per Dr. Abran, adenomatous polyp, repeat in 7 yrs   CYSTOSCOPY/URETEROSCOPY/HOLMIUM LASER/STENT PLACEMENT Right 03/19/2023   Procedure: CYSTOSCOPY RIGHT URETEROSCOPY, HOLMIUM LASER LITHOTRIPSY, RIGHT URETERAL STENT PLACEMENT;  Surgeon: Cam Morene ORN, MD;  Location: [facility];  Service: Urology;  Laterality: Right;  100 MINUTES   ESOPHAGOGASTRODUODENOSCOPY (EGD) WITH ESOPHAGEAL DILATION  12/31/2022   dr abran;   balloon dilatation   GASTROSTOMY  1989   per Dr. Neysa FONTANA HERNIA REPAIR Left 1991   KNEE ARTHROSCOPY Left    1973;  1975;  1990;  2003   NASAL SEPTUM SURGERY  1992   SHOULDER ARTHROSCOPY W/ SUBACROMIAL DECOMPRESSION AND DISTAL CLAVICLE EXCISION Right 07/23/2004   @ [facility] by Dr. Melodi;   and labral debridement   TOTAL HIP ARTHROPLASTY Right 10/21/2016   Procedure: RIGHT TOTAL HIP ARTHROPLASTY ANTERIOR APPROACH;  Surgeon: Dempsey Melodi, MD;  Location: [facility];  Service: Orthopedics;  Laterality: Right;   TOTAL HIP ARTHROPLASTY Left 10/07/2022   Procedure: TOTAL HIP ARTHROPLASTY ANTERIOR APPROACH;  Surgeon: Melodi Dempsey, MD;  Location: [facility];  Service: Orthopedics;  Laterality: Left;   TOTAL KNEE ARTHROPLASTY  08/26/2007   @ [facility]  Dr. Melodi   TRACHEOESOPHAGEAL FISTULA REPAIR  1957   3 months old    Social History Luke Skinner  reports that he quit smoking about 25 years ago. His smoking use included cigarettes. He started smoking about 53 years ago. He has a 14 pack-year smoking history. He has never used smokeless tobacco. He reports that he does not currently use alcohol. He reports that he does not use drugs.  family history includes Diabetes in his maternal uncle; Heart disease in his mother; Parkinsonism in his father; Thyroid  disease in his mother and sister.  Allergies  Allergen Reactions    Shellfish Allergy Other (See Comments)    Per pt crabs  feels weird   Simvastatin  Other (See Comments)    Joint pain.     Sulfa Antibiotics Hives       PHYSICAL EXAMINATION: Vital signs: BP 122/68   Pulse 68   Ht 5' 9 (1.753 m)   Wt 237 lb (107.5 kg)   BMI 35.00 kg/m  General: Well-developed, well-nourished, no acute distress HEENT: Sclerae are anicteric, Abdomen not reexamined Extremities: No edema Psychiatric: alert and oriented x3. Cooperative   ASSESSMENT:  1.  Ongoing problems with dysphagia.  Most likely due to the proximal postop stricture.  No response to dilation. 2.  GERD with esophageal stricture status post dilation. 3.  General Medical problems 4.  History of colon polyps.  Surveillance up-to-date  PLAN:  1.  Repeat upper endoscopy with esophageal dilation.  Planning on biopsy forceps stricture disruption and triamcinolone  injection, and the proximal stricture.  We will redilate the distal stricture as well.The nature of the procedure, as well as the risks, benefits, and alternatives were carefully and thoroughly reviewed with the patient. Ample time for discussion and questions allowed. The patient understood, was satisfied, and agreed to proceed. 2.  Continue twice daily PPI 3.  Recall precautions 4.  Surveillance colonoscopy around 2029

## 2024-08-17 NOTE — Patient Instructions (Signed)
 You have been scheduled for an endoscopy. Please follow written instructions given to you at your visit today.  If you use inhalers (even only as needed), please bring them with you on the day of your procedure.  If you take any of the following medications, they will need to be adjusted prior to your procedure:   DO NOT TAKE 7 DAYS PRIOR TO TEST- Trulicity (dulaglutide) Ozempic, Wegovy (semaglutide) Mounjaro (tirzepatide) Bydureon Bcise (exanatide extended release)  DO NOT TAKE 1 DAY PRIOR TO YOUR TEST Rybelsus (semaglutide) Adlyxin (lixisenatide) Victoza (liraglutide) Byetta (exanatide) ___________________________________________________________________________  _______________________________________________________  If your blood pressure at your visit was 140/90 or greater, please contact your primary care physician to follow up on this.  _______________________________________________________  If you are age 68 or older, your body mass index should be between 23-30. Your Body mass index is 35 kg/m. If this is out of the aforementioned range listed, please consider follow up with your Primary Care Provider.  If you are age 56 or younger, your body mass index should be between 19-25. Your Body mass index is 35 kg/m. If this is out of the aformentioned range listed, please consider follow up with your Primary Care Provider.   ________________________________________________________  The [facility] providers would like to encourage you to use MYCHART to communicate with providers for non-urgent requests or questions.  Due to long hold times on the telephone, sending your provider a message by [facility] may be a faster and more efficient way to get a response.  Please allow 48 business hours for a response.  Please remember that this is for non-urgent requests.  _______________________________________________________  [facility] is using a team-based approach to care.   Your team is made up of your doctor and two to three APPS. Our APPS (Nurse Practitioners and Physician Assistants) work with your physician to ensure care continuity for you. They are fully qualified to address your health concerns and develop a treatment plan. They communicate directly with your gastroenterologist to care for you. Seeing the Advanced Practice Practitioners on your physician's team can help you by facilitating care more promptly, often allowing for earlier appointments, access to diagnostic testing, procedures, and other specialty referrals.

## 2024-08-18 ENCOUNTER — Other Ambulatory Visit: Payer: Self-pay | Admitting: Family Medicine

## 2024-08-18 MED ORDER — METHYLPREDNISOLONE 4 MG PO TBPK: ORAL_TABLET | ORAL | 0 refills | Status: DC

## 2024-08-18 NOTE — Telephone Encounter (Signed)
 Pt notified & voiced understanding.

## 2024-08-18 NOTE — Progress Notes (Signed)
 Done

## 2024-08-18 NOTE — Telephone Encounter (Signed)
 I well send in a Prednisone taper pack

## 2024-08-21 ENCOUNTER — Other Ambulatory Visit: Payer: Self-pay | Admitting: Family Medicine

## 2024-08-21 ENCOUNTER — Ambulatory Visit (HOSPITAL_COMMUNITY)

## 2024-08-22 ENCOUNTER — Ambulatory Visit (HOSPITAL_COMMUNITY)
Admission: RE | Admit: 2024-08-22 | Discharge: 2024-08-22 | Disposition: A | Discharge status: Home / Self Care | Source: Ambulatory Visit | Attending: Internal Medicine | Admitting: Internal Medicine

## 2024-08-22 DIAGNOSIS — R011 Cardiac murmur, unspecified: Secondary | ICD-10-CM | POA: Diagnosis not present

## 2024-08-22 LAB — ECHOCARDIOGRAM COMPLETE
Area-P 1/2: 3.54 cm2
MV M vel: 5.06 m/s
MV Peak grad: 102.4 mmHg
Radius: 0.6 cm
S' Lateral: 2.8 cm

## 2024-08-23 ENCOUNTER — Ambulatory Visit: Payer: Self-pay | Admitting: Internal Medicine

## 2024-08-23 DIAGNOSIS — I340 Nonrheumatic mitral (valve) insufficiency: Secondary | ICD-10-CM

## 2024-08-28 DIAGNOSIS — J019 Acute sinusitis, unspecified: Secondary | ICD-10-CM | POA: Diagnosis not present

## 2024-08-31 DIAGNOSIS — L578 Other skin changes due to chronic exposure to nonionizing radiation: Secondary | ICD-10-CM | POA: Diagnosis not present

## 2024-08-31 DIAGNOSIS — L821 Other seborrheic keratosis: Secondary | ICD-10-CM | POA: Diagnosis not present

## 2024-08-31 DIAGNOSIS — D492 Neoplasm of unspecified behavior of bone, soft tissue, and skin: Secondary | ICD-10-CM | POA: Diagnosis not present

## 2024-08-31 DIAGNOSIS — B354 Tinea corporis: Secondary | ICD-10-CM | POA: Diagnosis not present

## 2024-08-31 DIAGNOSIS — L814 Other melanin hyperpigmentation: Secondary | ICD-10-CM | POA: Diagnosis not present

## 2024-08-31 DIAGNOSIS — L570 Actinic keratosis: Secondary | ICD-10-CM | POA: Diagnosis not present

## 2024-08-31 DIAGNOSIS — D044 Carcinoma in situ of skin of scalp and neck: Secondary | ICD-10-CM | POA: Diagnosis not present

## 2024-08-31 DIAGNOSIS — D225 Melanocytic nevi of trunk: Secondary | ICD-10-CM | POA: Diagnosis not present

## 2024-09-04 ENCOUNTER — Ambulatory Visit: Admitting: Internal Medicine

## 2024-09-04 ENCOUNTER — Encounter: Payer: Self-pay | Admitting: Internal Medicine

## 2024-09-04 VITALS — BP 151/72 | HR 56 | Temp 98.3°F | Resp 12 | Ht 69.0 in | Wt 237.0 lb

## 2024-09-04 DIAGNOSIS — K219 Gastro-esophageal reflux disease without esophagitis: Secondary | ICD-10-CM

## 2024-09-04 DIAGNOSIS — R1310 Dysphagia, unspecified: Secondary | ICD-10-CM | POA: Diagnosis not present

## 2024-09-04 DIAGNOSIS — I10 Essential (primary) hypertension: Secondary | ICD-10-CM | POA: Diagnosis not present

## 2024-09-04 DIAGNOSIS — E039 Hypothyroidism, unspecified: Secondary | ICD-10-CM | POA: Diagnosis not present

## 2024-09-04 DIAGNOSIS — Z9889 Other specified postprocedural states: Secondary | ICD-10-CM | POA: Diagnosis not present

## 2024-09-04 DIAGNOSIS — K222 Esophageal obstruction: Secondary | ICD-10-CM | POA: Diagnosis not present

## 2024-09-04 DIAGNOSIS — K229 Disease of esophagus, unspecified: Secondary | ICD-10-CM | POA: Diagnosis not present

## 2024-09-04 DIAGNOSIS — K449 Diaphragmatic hernia without obstruction or gangrene: Secondary | ICD-10-CM | POA: Diagnosis not present

## 2024-09-04 DIAGNOSIS — E7849 Other hyperlipidemia: Secondary | ICD-10-CM | POA: Diagnosis not present

## 2024-09-04 MED ORDER — SODIUM CHLORIDE 0.9 % IV SOLN: 500.0000 mL | Freq: Once | INTRAVENOUS | Status: DC

## 2024-09-04 NOTE — Progress Notes (Signed)
 Pt's states no medical or surgical changes since previsit or office visit.

## 2024-09-04 NOTE — Progress Notes (Signed)
 Expand All Collapse All HISTORY OF PRESENT ILLNESS:   Luke Skinner is a 68 y.o. male with multiple medical problems as listed below.  He presents today for post procedure follow-up regarding issues with dysphagia.  He is accompanied by his wife.   The patient was last evaluated in this office 12/08/2023 regarding GERD complicated by peptic stricture and intermittent solid food dysphagia.  On May 10, 2024 he underwent upper endoscopy.  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  More distally he was noted to have classic peptic stricture which was balloon dilated to 20 mm without mucosal disruption.  The more proximal stricture was also dilated to 19 mm without mucosal disruption.  Exam was otherwise unremarkable.  He follows up at this time.   He tells me that his swallowing is unchanged.  Still with intermittent solid food dysphagia.  Particularly proximal.  Currently having issues with upper respiratory illness and congestion.  He has been taking his pantoprazole  twice daily.   REVIEW OF SYSTEMS:   All non-GI ROS negative except for sinus and allergy, arthritis, back pain, cough, fatigue, headaches, fever, heart murmur, itching, muscle cramps, skin rash, sore throat.  He is currently on antibiotics.       Past Medical History:  Diagnosis Date   Diverticulosis of colon      left side   GERD (gastroesophageal reflux disease)     Hiatal hernia     History of adenomatous polyp of colon     History of duodenal ulcer 2001    non-bleeding   History of kidney stones 2015   History of repair of tracheoesophageal fistula 1957   Hyperlipidemia, mixed     Hypertension      nuclear stress test in epic 08-05-2014  normal study no ischemic, nuclear ef 56%   Hypothyroidism      endocrinologist-- dr m. doerr;   dx age 90s   OA (osteoarthritis)     Renal calculus, right     S/P dilatation of esophageal stricture       followed by dr abran;   last balloon dilation 12-31-2022;   hx multiple dilatation's               Past Surgical History:  Procedure Laterality Date   COLONOSCOPY WITH PROPOFOL    02/24/2021    per Dr. Abran, adenomatous polyp, repeat in 7 yrs   CYSTOSCOPY/URETEROSCOPY/HOLMIUM LASER/STENT PLACEMENT Right 03/19/2023    Procedure: CYSTOSCOPY RIGHT URETEROSCOPY, HOLMIUM LASER LITHOTRIPSY, RIGHT URETERAL STENT PLACEMENT;  Surgeon: Cam Morene ORN, MD;  Location: [facility];  Service: Urology;  Laterality: Right;  100 MINUTES   ESOPHAGOGASTRODUODENOSCOPY (EGD) WITH ESOPHAGEAL DILATION   12/31/2022    dr abran;   balloon dilatation   GASTROSTOMY   1989    per Dr. Neysa FONTANA HERNIA REPAIR Left 1991   KNEE ARTHROSCOPY Left      1973;  1975;  1990;  2003   NASAL SEPTUM SURGERY   1992   SHOULDER ARTHROSCOPY W/ SUBACROMIAL DECOMPRESSION AND DISTAL CLAVICLE EXCISION Right 07/23/2004    @ [facility] by Dr. Melodi;   and labral debridement   TOTAL HIP ARTHROPLASTY Right 10/21/2016    Procedure: RIGHT TOTAL HIP ARTHROPLASTY ANTERIOR APPROACH;  Surgeon: Dempsey Melodi, MD;  Location: [facility];  Service: Orthopedics;  Laterality: Right;   TOTAL HIP ARTHROPLASTY Left 10/07/2022    Procedure: TOTAL  HIP ARTHROPLASTY ANTERIOR APPROACH;  Surgeon: Melodi Lerner, MD;  Location: [facility];  Service: Orthopedics;  Laterality: Left;   TOTAL KNEE ARTHROPLASTY   08/26/2007    @ [facility]  Dr. Melodi   TRACHEOESOPHAGEAL FISTULA REPAIR   1957    3 months old          Social History Luke Skinner  reports that he quit smoking about 25 years ago. His smoking use included cigarettes. He started smoking about 53 years ago. He has a 14 pack-year smoking history. He has never used smokeless tobacco. He reports that he does not currently use alcohol. He reports that he does not use drugs.   family history includes Diabetes in his maternal uncle; Heart disease in his mother; Parkinsonism in his  father; Thyroid  disease in his mother and sister.   Allergies       Allergies  Allergen Reactions   Shellfish Allergy Other (See Comments)      Per pt crabs  feels weird   Simvastatin  Other (See Comments)      Joint pain.     Sulfa Antibiotics Hives            PHYSICAL EXAMINATION: Vital signs: BP 122/68   Pulse 68   Ht 5' 9 (1.753 m)   Wt 237 lb (107.5 kg)   BMI 35.00 kg/m  General: Well-developed, well-nourished, no acute distress HEENT: Sclerae are anicteric, Abdomen not reexamined Extremities: No edema Psychiatric: alert and oriented x3. Cooperative     ASSESSMENT:   1.  Ongoing problems with dysphagia.  Most likely due to the proximal postop stricture.  No response to dilation. 2.  GERD with esophageal stricture status post dilation. 3.  General Medical problems 4.  History of colon polyps.  Surveillance up-to-date   PLAN:   1.  Repeat upper endoscopy with esophageal dilation.  Planning on biopsy forceps stricture disruption and triamcinolone  injection, and the proximal stricture.  We will redilate the distal stricture as well.The nature of the procedure, as well as the risks, benefits, and alternatives were carefully and thoroughly reviewed with the patient. Ample time for discussion and questions allowed. The patient understood, was satisfied, and agreed to proceed. 2.  Continue twice daily PPI 3.  Recall precautions 4.  Surveillance colonoscopy around 2029      Recent H&P as above.  No interval change.  Now for EGD with biopsy, injection therapy, and balloon dilation.

## 2024-09-04 NOTE — Op Note (Signed)
 [facility] Patient Name: Luke Skinner Procedure Date: 09/04/2024 10:08 AM MRN: [id number] Endoscopist: Norleen SAILOR. Skinner , MD, [id number] Age: 68 Referring MD:  Date of Birth: 09/30/1956 Gender: Male Account #: [id number] Procedure:                Upper GI endoscopy with balloon dilation, biopsies,                            injection therapy Indications:              Dysphagia, Esophageal reflux Medicines:                Monitored Anesthesia Care Procedure:                Pre-Anesthesia Assessment:                           - Prior to the procedure, a History and Physical                            was performed, and patient medications and                            allergies were reviewed. The patient's tolerance of                            previous anesthesia was also reviewed. The risks                            and benefits of the procedure and the sedation                            options and risks were discussed with the patient.                            All questions were answered, and informed consent                            was obtained. Prior Anticoagulants: The patient has                            taken no anticoagulant or antiplatelet agents. ASA                            Grade Assessment: II - A patient with mild systemic                            disease. After reviewing the risks and benefits,                            the patient was deemed in satisfactory condition to                            undergo the procedure.  After obtaining informed consent, the endoscope was                            passed under direct vision. Throughout the                            procedure, the patient's blood pressure, pulse, and                            oxygen saturations were monitored continuously. The                            GIF [id number] #[id number] was introduced through the                            mouth, and advanced to the  second part of duodenum.                            The upper GI endoscopy was accomplished without                            difficulty. The patient tolerated the procedure                            well. Scope In: Scope Out: Findings:                 The esophagus revealed a 14 mm in diameter weblike                            semicircular stricture in the proximal esophagus at                            25 cm. This was injected with a total of 40 mg of                            triamcinolone  in various portions of the stricture.                            The stricture was then biopsied multiple times in                            an effort to disrupt the stricture. Finally, this                            was gradually dilated up to 20 mm. In addition, a                            large caliber distal esophageal stricture at the                            gastroesophageal junction was balloon dilated to 20  mm. No trauma..                           The stomach was normal, save a small hiatal hernia.                           The examined duodenum was normal.                           The cardia and gastric fundus were normal on                            retroflexion. Complications:            No immediate complications. Estimated Blood Loss:     Estimated blood loss: none. Impression:               1. Proximal esophageal stricture at prior area of                            tracheoesophageal fistula repair. Injected,                            biopsied, and dilated to 20 mm                           2. Large caliber distal esophageal stricture.                            Dilated to 20 mm                           3. Otherwise unremarkable esophagus                           4. Normal stomach and duodenum, save small hiatal                            hernia Recommendation:           - Patient has a contact number available for                             emergencies. The signs and symptoms of potential                            delayed complications were discussed with the                            patient. Return to normal activities tomorrow.                            Written discharge instructions were provided to the                            patient.                           -  Post dilation diet.                           - Continue present medications.                           - Await pathology results.                           - Office follow-up with Luke Skinner in 3 months Norleen SAILOR. Abran, MD 09/04/2024 10:43:46 AM This report has been signed electronically.

## 2024-09-04 NOTE — Patient Instructions (Addendum)
 Post dilation diet. Continue present medications. Awaiting pathology results. Follow up with office to schedule 3 month appointment Handout provided on post dilation diet and esophageal stricture.    YOU HAD AN ENDOSCOPIC PROCEDURE TODAY AT THE [facility]:   Refer to the procedure report that was given to you for any specific questions about what was found during the examination.  If the procedure report does not answer your questions, please call your gastroenterologist to clarify.  If you requested that your care partner not be given the details of your procedure findings, then the procedure report has been included in a sealed envelope for you to review at your convenience later.  YOU SHOULD EXPECT: Some feelings of bloating in the abdomen. Passage of more gas than usual.  Walking can help get rid of the air that was put into your GI tract during the procedure and reduce the bloating. If you had a lower endoscopy (such as a colonoscopy or flexible sigmoidoscopy) you may notice spotting of blood in your stool or on the toilet paper. If you underwent a bowel prep for your procedure, you may not have a normal bowel movement for a few days.  Please Note:  You might notice some irritation and congestion in your nose or some drainage.  This is from the oxygen used during your procedure.  There is no need for concern and it should clear up in a day or so.  SYMPTOMS TO REPORT IMMEDIATELY:  Following upper endoscopy (EGD)  Vomiting of blood or coffee ground material  New chest pain or pain under the shoulder blades  Painful or persistently difficult swallowing  New shortness of breath  Fever of 100F or higher  Black, tarry-looking stools  For urgent or emergent issues, a gastroenterologist can be reached at any hour by calling [phone]. Do not use MyChart messaging for urgent concerns.    DIET:  We do recommend a small meal at first, but then you may proceed to your regular  diet.  Drink plenty of fluids but you should avoid alcoholic beverages for 24 hours.  ACTIVITY:  You should plan to take it easy for the rest of today and you should NOT DRIVE or use heavy machinery until tomorrow (because of the sedation medicines used during the test).    FOLLOW UP: Our staff will call the number listed on your records the next business day following your procedure.  We will call around 7:15- 8:00 am to check on you and address any questions or concerns that you may have regarding the information given to you following your procedure. If we do not reach you, we will leave a message.     If any biopsies were taken you will be contacted by phone or by letter within the next 1-3 weeks.  Please call us  at [phone] if you have not heard about the biopsies in 3 weeks.    SIGNATURES/CONFIDENTIALITY: You and/or your care partner have signed paperwork which will be entered into your electronic medical record.  These signatures attest to the fact that that the information above on your After Visit Summary has been reviewed and is understood.  Full responsibility of the confidentiality of this discharge information lies with you and/or your care-partner.

## 2024-09-04 NOTE — Progress Notes (Signed)
 Sedate, gd SR, tolerated procedure well, VSS, report to RN

## 2024-09-04 NOTE — Progress Notes (Signed)
 Called to room to assist during endoscopic procedure.  Patient ID and intended procedure confirmed with present staff. Received instructions for my participation in the procedure from the performing physician.

## 2024-09-05 ENCOUNTER — Telehealth: Payer: Self-pay

## 2024-09-05 ENCOUNTER — Other Ambulatory Visit: Payer: Self-pay

## 2024-09-05 NOTE — Telephone Encounter (Signed)
  Follow up Call-     09/04/2024    9:48 AM 05/10/2024    9:08 AM 12/31/2022    7:12 AM  Call back number  Post procedure Call Back phone  # [phone]  Permission to leave phone message Yes Yes Yes     Patient questions:  Do you have a fever, pain , or abdominal swelling? No. Pain Score  0 *  Have you tolerated food without any problems? Yes.    Have you been able to return to your normal activities? Yes.    Do you have any questions about your discharge instructions: Diet   No. Medications  Yes.   Requests to have Rx quantity to be change to 200 tablets.  Follow up visit  No.  Do you have questions or concerns about your Care? No.  Actions: * If pain score is 4 or above: No action needed, pain <4.

## 2024-09-06 LAB — SURGICAL PATHOLOGY

## 2024-09-07 ENCOUNTER — Ambulatory Visit: Payer: Self-pay | Admitting: Internal Medicine

## 2024-09-07 ENCOUNTER — Other Ambulatory Visit: Payer: Self-pay | Admitting: Family Medicine

## 2024-09-13 DIAGNOSIS — J019 Acute sinusitis, unspecified: Secondary | ICD-10-CM | POA: Diagnosis not present

## 2024-11-24 ENCOUNTER — Ambulatory Visit: Admitting: Family Medicine

## 2024-11-24 ENCOUNTER — Ambulatory Visit: Payer: Self-pay

## 2024-11-24 ENCOUNTER — Encounter: Payer: Self-pay | Admitting: Family Medicine

## 2024-11-24 VITALS — BP 138/60 | HR 81 | Temp 97.5°F | Wt 240.1 lb

## 2024-11-24 DIAGNOSIS — J320 Chronic maxillary sinusitis: Secondary | ICD-10-CM

## 2024-11-24 MED ORDER — CLINDAMYCIN HCL 300 MG PO CAPS: 300.0000 mg | ORAL_CAPSULE | Freq: Three times a day (TID) | ORAL | 1 refills | Status: AC

## 2024-11-24 NOTE — Telephone Encounter (Signed)
 FYI Only or Action Required?: FYI only for provider: appointment scheduled on 11/24/24.  Patient was last seen in primary care on 08/14/2024 by Johnny Garnette LABOR, MD.  Called Nurse Triage reporting Recurrent Sinusitis and Cough.  Symptoms began several months ago.  Interventions attempted: Prescription medications: 3 rounds of antibiotic and Other: Saline nasal rinse.  Symptoms are: gradually worsening.  Triage Disposition: See PCP When Office is Open (Within 3 Days)  Patient/caregiver understands and will follow disposition?: Yes                 Message from [facility] sent at 11/24/2024  9:25 AM EST  ENT - can't get a hold of them and wants to see pcp. Pt has had a sinus infection for over 3 months and has been on 3 different antibiotics. Had a tooth pulled to resolve the issue but also now has a cold. And thinks he need a stronger antibiotic. Pt is prone to pneumonia.   Reason for Disposition  [1] Sinus congestion (pressure, fullness) AND [2] present > 10 days  Answer Assessment - Initial Assessment Questions Attempted to contact ENT first: called past 2 days, called this AM and left on hold for over 30 minutes. History of sinus infection for past 3 months, told it was an upper molar causing the symptoms so he had it removed in December.  1. LOCATION: Where does it hurt?      Behind eyes, mostly left side.  2. ONSET: When did the sinus pain start?  (e.g., hours, days)      End of October.  3. SEVERITY: How bad is the pain?   (Scale 0-10; or none, mild, moderate or severe)     5/10.  4. RECURRENT SYMPTOM: Have you ever had sinus problems before? If Yes, ask: When was the last time? and What happened that time?      Not really. Was hospitalized in the past with a deeply embedded sinus infection. Patient states this time is completley different.  5. NASAL CONGESTION: Is the nose blocked? If Yes, ask: Can you open it or must you breathe through your  mouth?     Yes, will blow his nose and it blocks up again swiftly.  6. NASAL DISCHARGE: Do you have discharge from your nose? If so ask, What color?     Yes, dark yellow to green.  7. FEVER: Do you have a fever? If Yes, ask: What is it, how was it measured, and when did it start?      No.  8. OTHER SYMPTOMS: Do you have any other symptoms? (e.g., sore throat, cough, earache, difficulty breathing)     Cough, sneezing, post nasal drip, diarrhea (off and on since end of October, 1 episode watery diarrhea in past 24 hours). No difficulty breathing, fever,bloody stool, nausea, vomiting.  Protocols used: Sinus Pain or Congestion-A-AH

## 2024-11-24 NOTE — Telephone Encounter (Signed)
 Noted.

## 2024-11-24 NOTE — Progress Notes (Signed)
 "  Established Patient Office Visit  Subjective   Patient ID: Luke Skinner, male    DOB: 1956/07/28  Age: 69 y.o. MRN: [id number]  Chief Complaint  Patient presents with   Sinusitis   Cough    HPI   Luke Skinner is here with concerns for recurrent/chronic sinusitis.  He has seen ENT and has been on multiple courses of antibiotics over the past few months.  History is that he was on multiple courses of antibiotics last fall including Augmentin and then subsequently Levaquin  without improvement.  Eventually had CT sinuses November 26 which showed periapical lucency above the left first maxillary molar with a small amount of bony dehiscence into the left maxillary sinus.  He had evidence for frothy secretions in the right maxillary sinus and near complete opacification of left maxillary sinus with fluid levels.  He was placed on clindamycin  300 mg 3 times daily for 14 days and was seeing some improvement.  Based on CT findings was referred to dentist and had his molar extracted and apparently some purulent secretions came out at that point.  He was treated on 12-15 with Augmentin for about 10 days.  Again had some gradual slight improvement.  He and his wife are getting ready to go on another cruise in a couple weeks and he is concerned about recurrent symptoms now.  He had yellowish thick drainage and foul odor and taste.  He has frequent postnasal drainage symptoms.  No fever.  No headache.  He tried to contact his ENT earlier but did not get any response.  Past Medical History:  Diagnosis Date   Diverticulosis of colon    left side   GERD (gastroesophageal reflux disease)    Hiatal hernia    History of adenomatous polyp of colon    History of duodenal ulcer 2001   non-bleeding   History of kidney stones 2015   History of repair of tracheoesophageal fistula 1957   Hyperlipidemia, mixed    Hypertension    nuclear stress test in epic 08-05-2014  normal study no ischemic, nuclear ef 56%    Hypothyroidism    endocrinologist-- dr m. doerr;   dx age 78s   OA (osteoarthritis)    Renal calculus, right    S/P dilatation of esophageal stricture    followed by dr abran;   last balloon dilation 12-31-2022;   hx multiple dilatation's   Past Surgical History:  Procedure Laterality Date   COLONOSCOPY WITH PROPOFOL   02/24/2021   per Dr. Abran, adenomatous polyp, repeat in 7 yrs   CYSTOSCOPY/URETEROSCOPY/HOLMIUM LASER/STENT PLACEMENT Right 03/19/2023   Procedure: CYSTOSCOPY RIGHT URETEROSCOPY, HOLMIUM LASER LITHOTRIPSY, RIGHT URETERAL STENT PLACEMENT;  Surgeon: Cam Morene ORN, MD;  Location: [facility];  Service: Urology;  Laterality: Right;  100 MINUTES   ESOPHAGOGASTRODUODENOSCOPY (EGD) WITH ESOPHAGEAL DILATION  12/31/2022   dr abran;   balloon dilatation   GASTROSTOMY  1989   per Dr. Neysa FONTANA HERNIA REPAIR Left 1991   KNEE ARTHROSCOPY Left    1973;  1975;  1990;  2003   NASAL SEPTUM SURGERY  1992   SHOULDER ARTHROSCOPY W/ SUBACROMIAL DECOMPRESSION AND DISTAL CLAVICLE EXCISION Right 07/23/2004   @ [facility] by Dr. Melodi;   and labral debridement   TOTAL HIP ARTHROPLASTY Right 10/21/2016   Procedure: RIGHT TOTAL HIP ARTHROPLASTY ANTERIOR APPROACH;  Surgeon: Dempsey Melodi, MD;  Location: [facility];  Service: Orthopedics;  Laterality: Right;   TOTAL HIP ARTHROPLASTY Left 10/07/2022  Procedure: TOTAL HIP ARTHROPLASTY ANTERIOR APPROACH;  Surgeon: Melodi Lerner, MD;  Location: [facility];  Service: Orthopedics;  Laterality: Left;   TOTAL KNEE ARTHROPLASTY  08/26/2007   @ [facility]  Dr. Melodi   TRACHEOESOPHAGEAL FISTULA REPAIR  1957   3 months old    reports that he quit smoking about 26 years ago. His smoking use included cigarettes. He started smoking about 54 years ago. He has a 14 pack-year smoking history. He has never used smokeless tobacco. He reports that he does not currently use alcohol. He reports that he does not use drugs. family history includes Diabetes  in his maternal uncle; Heart disease in his mother; Parkinsonism in his father; Thyroid  disease in his mother and sister. Allergies[1]  Review of Systems  Constitutional:  Negative for chills and fever.  HENT:  Positive for sinus pain. Negative for ear pain.   Respiratory:  Positive for cough.   Neurological:  Negative for headaches.      Objective:     BP 138/60   Pulse 81   Temp (!) 97.5 F (36.4 C) (Oral)   Wt 240 lb 1.6 oz (108.9 kg)   SpO2 96%   BMI 35.46 kg/m  BP Readings from Last 3 Encounters:  11/24/24 138/60  09/04/24 (!) 151/72  08/17/24 122/68   Wt Readings from Last 3 Encounters:  11/24/24 240 lb 1.6 oz (108.9 kg)  09/04/24 237 lb (107.5 kg)  08/17/24 237 lb (107.5 kg)      Physical Exam Vitals reviewed.  Constitutional:      General: He is not in acute distress.    Appearance: He is not ill-appearing.  HENT:     Right Ear: Tympanic membrane normal.     Left Ear: Tympanic membrane normal.     Nose:     Comments: Erythematous nasal mucosa.  Somewhat swollen turbinates    Mouth/Throat:     Mouth: Mucous membranes are moist.     Pharynx: Oropharynx is clear.  Cardiovascular:     Rate and Rhythm: Normal rate and regular rhythm.  Pulmonary:     Effort: Pulmonary effort is normal.     Breath sounds: Normal breath sounds. No wheezing or rales.  Musculoskeletal:     Cervical back: Neck supple.  Neurological:     Mental Status: He is alert.      No results found for any visits on 11/24/24.    The 10-year ASCVD risk score (Arnett DK, et al., 2019) is: 19.9%    Assessment & Plan:   Recurrent/chronic sinusitis.  Patient had evidence on CT scan 09-13-2024 of bilateral maxillary sinusitis.  He had extracted molar in December.  Multiple courses of antibiotics.  Suspected anaerobic component.  He did respond when he was treated previously with clindamycin  by ENT.  -We discussed risk of recurrent antibiotic courses and prolonged antibiotics such as  C. Difficile - Start back clindamycin  300 mg 3 times daily for 10 days.  1 refill if he is seeing partial but not complete improvement - Recommend consider over-the-counter probiotic such as Florastor or align especially while taking the antibiotics - Follow-up with his ENT for any persistent or worsening symptoms  Wolm Scarlet, MD     [1]  Allergies Allergen Reactions   Shellfish Allergy Other (See Comments)    Per pt crabs  feels weird   Simvastatin  Other (See Comments)    Joint pain.     Sulfa Antibiotics Hives   "

## 2024-11-24 NOTE — Patient Instructions (Signed)
 Consider probiotic such as Align or Florastor while on the antibiotics.

## 2024-12-07 ENCOUNTER — Ambulatory Visit: Admitting: Internal Medicine

## 2025-02-26 ENCOUNTER — Encounter: Admitting: Family Medicine

## 2025-04-11 ENCOUNTER — Ambulatory Visit

## 2025-04-16 ENCOUNTER — Ambulatory Visit
